# Patient Record
Sex: Female | Born: 1935
Health system: Southern US, Community
[De-identification: ages and names within clinical notes are randomized; demographics above are authoritative.]

## PROBLEM LIST (undated history)

## (undated) DIAGNOSIS — I350 Nonrheumatic aortic (valve) stenosis: Secondary | ICD-10-CM

## (undated) DIAGNOSIS — R7309 Other abnormal glucose: Secondary | ICD-10-CM

## (undated) DIAGNOSIS — I10 Essential (primary) hypertension: Secondary | ICD-10-CM

## (undated) DIAGNOSIS — Z9109 Other allergy status, other than to drugs and biological substances: Secondary | ICD-10-CM

## (undated) DIAGNOSIS — M810 Age-related osteoporosis without current pathological fracture: Secondary | ICD-10-CM

## (undated) DIAGNOSIS — F17200 Nicotine dependence, unspecified, uncomplicated: Secondary | ICD-10-CM

## (undated) DIAGNOSIS — E785 Hyperlipidemia, unspecified: Secondary | ICD-10-CM

## (undated) DIAGNOSIS — T7840XA Allergy, unspecified, initial encounter: Secondary | ICD-10-CM

## (undated) DIAGNOSIS — H269 Unspecified cataract: Secondary | ICD-10-CM

## (undated) HISTORY — DX: Nicotine dependence, unspecified, uncomplicated: F17.200

## (undated) HISTORY — DX: Age-related osteoporosis without current pathological fracture: M81.0

## (undated) HISTORY — DX: Hyperlipidemia, unspecified: E78.5

## (undated) HISTORY — DX: Allergy, unspecified, initial encounter: T78.40XA

## (undated) HISTORY — PX: POLYPECTOMY: SHX149

## (undated) HISTORY — DX: Essential (primary) hypertension: I10

## (undated) HISTORY — PX: ABDOMINAL HYSTERECTOMY: SHX81

## (undated) HISTORY — DX: Other allergy status, other than to drugs and biological substances: Z91.09

## (undated) HISTORY — DX: Other abnormal glucose: R73.09

## (undated) HISTORY — DX: Unspecified cataract: H26.9

## (undated) HISTORY — PX: COLONOSCOPY: SHX174

---

## 1978-03-20 HISTORY — PX: TOTAL ABDOMINAL HYSTERECTOMY W/ BILATERAL SALPINGOOPHORECTOMY: SHX83

## 2001-05-19 ENCOUNTER — Emergency Department (HOSPITAL_COMMUNITY): Admission: EM | Admit: 2001-05-19 | Discharge: 2001-05-19 | Payer: Self-pay | Admitting: Emergency Medicine

## 2004-12-22 ENCOUNTER — Ambulatory Visit: Payer: Self-pay | Admitting: Endocrinology

## 2004-12-29 ENCOUNTER — Ambulatory Visit: Payer: Self-pay | Admitting: Endocrinology

## 2006-02-20 ENCOUNTER — Ambulatory Visit: Payer: Self-pay | Admitting: Endocrinology

## 2006-02-20 LAB — CONVERTED CEMR LAB
AST: 22 units/L (ref 0–37)
Albumin: 3.9 g/dL (ref 3.5–5.2)
Alkaline Phosphatase: 78 units/L (ref 39–117)
Basophils Absolute: 0 10*3/uL (ref 0.0–0.1)
Bilirubin Urine: NEGATIVE
CO2: 23 meq/L (ref 19–32)
Chloride: 108 meq/L (ref 96–112)
GFR calc non Af Amer: 66 mL/min
Glomerular Filtration Rate, Af Am: 80 mL/min/{1.73_m2}
Glucose, Bld: 102 mg/dL — ABNORMAL HIGH (ref 70–99)
HCT: 39 % (ref 36.0–46.0)
HDL: 56.5 mg/dL (ref >39.0)
Ketones, ur: NEGATIVE mg/dL
Lymphocytes Relative: 33.7 % (ref 12.0–46.0)
MCV: 88.9 fL (ref 78.0–100.0)
Potassium: 4.2 meq/L (ref 3.5–5.1)
TSH: 2.28 microintl units/mL (ref 0.35–5.50)
Total Bilirubin: 0.6 mg/dL (ref 0.3–1.2)
Total Protein, Urine: NEGATIVE mg/dL
Urine Glucose: NEGATIVE mg/dL
Urobilinogen, UA: 0.2 (ref 0.0–1.0)
VLDL: 22 mg/dL (ref 0–40)
pH: 5.5 (ref 5.0–8.0)

## 2006-02-23 ENCOUNTER — Ambulatory Visit: Payer: Self-pay | Admitting: Endocrinology

## 2006-03-22 ENCOUNTER — Ambulatory Visit: Payer: Self-pay | Admitting: Gastroenterology

## 2006-03-22 ENCOUNTER — Ambulatory Visit: Payer: Self-pay | Admitting: Family Medicine

## 2006-04-02 ENCOUNTER — Encounter (INDEPENDENT_AMBULATORY_CARE_PROVIDER_SITE_OTHER): Payer: Self-pay | Admitting: *Deleted

## 2006-04-02 ENCOUNTER — Ambulatory Visit: Payer: Self-pay | Admitting: Gastroenterology

## 2006-12-01 ENCOUNTER — Encounter: Payer: Self-pay | Admitting: Endocrinology

## 2006-12-01 DIAGNOSIS — F17200 Nicotine dependence, unspecified, uncomplicated: Secondary | ICD-10-CM

## 2006-12-01 DIAGNOSIS — I10 Essential (primary) hypertension: Secondary | ICD-10-CM

## 2006-12-01 DIAGNOSIS — E785 Hyperlipidemia, unspecified: Secondary | ICD-10-CM

## 2006-12-01 DIAGNOSIS — R739 Hyperglycemia, unspecified: Secondary | ICD-10-CM | POA: Insufficient documentation

## 2006-12-01 DIAGNOSIS — R7309 Other abnormal glucose: Secondary | ICD-10-CM

## 2006-12-01 DIAGNOSIS — M810 Age-related osteoporosis without current pathological fracture: Secondary | ICD-10-CM | POA: Insufficient documentation

## 2006-12-01 HISTORY — DX: Essential (primary) hypertension: I10

## 2006-12-01 HISTORY — DX: Hyperlipidemia, unspecified: E78.5

## 2006-12-01 HISTORY — DX: Nicotine dependence, unspecified, uncomplicated: F17.200

## 2006-12-01 HISTORY — DX: Age-related osteoporosis without current pathological fracture: M81.0

## 2006-12-01 HISTORY — DX: Other abnormal glucose: R73.09

## 2007-03-20 ENCOUNTER — Encounter: Payer: Self-pay | Admitting: Endocrinology

## 2007-04-24 ENCOUNTER — Ambulatory Visit: Payer: Self-pay | Admitting: Endocrinology

## 2007-05-01 ENCOUNTER — Ambulatory Visit: Payer: Self-pay | Admitting: Internal Medicine

## 2007-05-01 ENCOUNTER — Encounter: Payer: Self-pay | Admitting: Endocrinology

## 2007-05-21 ENCOUNTER — Telehealth (INDEPENDENT_AMBULATORY_CARE_PROVIDER_SITE_OTHER): Payer: Self-pay | Admitting: *Deleted

## 2008-01-30 ENCOUNTER — Encounter: Payer: Self-pay | Admitting: Endocrinology

## 2008-05-08 ENCOUNTER — Ambulatory Visit: Payer: Self-pay | Admitting: Endocrinology

## 2008-05-11 ENCOUNTER — Telehealth (INDEPENDENT_AMBULATORY_CARE_PROVIDER_SITE_OTHER): Payer: Self-pay | Admitting: *Deleted

## 2008-05-25 ENCOUNTER — Telehealth (INDEPENDENT_AMBULATORY_CARE_PROVIDER_SITE_OTHER): Payer: Self-pay | Admitting: *Deleted

## 2008-06-11 ENCOUNTER — Ambulatory Visit: Payer: Self-pay | Admitting: Endocrinology

## 2008-07-30 ENCOUNTER — Encounter: Payer: Self-pay | Admitting: Endocrinology

## 2009-02-02 ENCOUNTER — Encounter: Payer: Self-pay | Admitting: Endocrinology

## 2009-02-05 ENCOUNTER — Encounter: Payer: Self-pay | Admitting: Endocrinology

## 2009-03-02 ENCOUNTER — Encounter (INDEPENDENT_AMBULATORY_CARE_PROVIDER_SITE_OTHER): Payer: Self-pay | Admitting: *Deleted

## 2009-03-23 ENCOUNTER — Encounter (INDEPENDENT_AMBULATORY_CARE_PROVIDER_SITE_OTHER): Payer: Self-pay | Admitting: *Deleted

## 2009-03-25 ENCOUNTER — Encounter (INDEPENDENT_AMBULATORY_CARE_PROVIDER_SITE_OTHER): Payer: Self-pay | Admitting: *Deleted

## 2009-03-26 ENCOUNTER — Ambulatory Visit: Payer: Self-pay | Admitting: Gastroenterology

## 2009-04-05 ENCOUNTER — Ambulatory Visit: Payer: Self-pay | Admitting: Gastroenterology

## 2009-04-05 LAB — HM COLONOSCOPY

## 2009-04-08 ENCOUNTER — Encounter: Payer: Self-pay | Admitting: Gastroenterology

## 2009-04-13 ENCOUNTER — Telehealth: Payer: Self-pay | Admitting: Endocrinology

## 2009-04-19 ENCOUNTER — Telehealth: Payer: Self-pay | Admitting: Endocrinology

## 2009-05-17 ENCOUNTER — Telehealth: Payer: Self-pay | Admitting: Endocrinology

## 2009-06-21 ENCOUNTER — Telehealth: Payer: Self-pay | Admitting: Endocrinology

## 2009-06-21 ENCOUNTER — Telehealth (INDEPENDENT_AMBULATORY_CARE_PROVIDER_SITE_OTHER): Payer: Self-pay | Admitting: *Deleted

## 2009-06-22 ENCOUNTER — Ambulatory Visit: Payer: Self-pay | Admitting: Endocrinology

## 2009-06-22 ENCOUNTER — Ambulatory Visit: Payer: Self-pay | Admitting: Internal Medicine

## 2009-06-22 DIAGNOSIS — M25559 Pain in unspecified hip: Secondary | ICD-10-CM

## 2009-06-22 LAB — CONVERTED CEMR LAB: Calcium, Total (PTH): 9.4 mg/dL (ref 8.4–10.5)

## 2009-06-28 ENCOUNTER — Telehealth: Payer: Self-pay | Admitting: Endocrinology

## 2009-07-19 ENCOUNTER — Encounter: Payer: Self-pay | Admitting: Endocrinology

## 2009-08-10 ENCOUNTER — Encounter: Payer: Self-pay | Admitting: Endocrinology

## 2009-08-18 ENCOUNTER — Encounter: Payer: Self-pay | Admitting: Endocrinology

## 2009-08-23 ENCOUNTER — Telehealth: Payer: Self-pay | Admitting: Endocrinology

## 2010-02-03 ENCOUNTER — Encounter: Payer: Self-pay | Admitting: Endocrinology

## 2010-02-07 ENCOUNTER — Encounter: Payer: Self-pay | Admitting: Endocrinology

## 2010-02-14 ENCOUNTER — Other Ambulatory Visit
Admission: RE | Admit: 2010-02-14 | Discharge: 2010-02-14 | Payer: Self-pay | Source: Home / Self Care | Admitting: Radiology

## 2010-02-14 ENCOUNTER — Encounter: Payer: Self-pay | Admitting: Endocrinology

## 2010-03-30 ENCOUNTER — Telehealth: Payer: Self-pay | Admitting: Endocrinology

## 2010-04-17 LAB — CONVERTED CEMR LAB
ALT: 24 units/L (ref 0–35)
ALT: 25 units/L (ref 0–35)
AST: 23 units/L (ref 0–37)
Albumin: 4.2 g/dL (ref 3.5–5.2)
BUN: 7 mg/dL (ref 6–23)
Bacteria, UA: NEGATIVE
Basophils Absolute: 0 10*3/uL (ref 0.0–0.1)
Basophils Relative: 0.6 % (ref 0.0–3.0)
Basophils Relative: 0.9 % (ref 0.0–1.0)
Bilirubin Urine: NEGATIVE
Bilirubin Urine: NEGATIVE
Bilirubin, Direct: 0.2 mg/dL (ref 0.0–0.3)
Bilirubin, Direct: 0.2 mg/dL (ref 0.0–0.3)
Calcium: 9.6 mg/dL (ref 8.4–10.5)
Calcium: 9.6 mg/dL (ref 8.4–10.5)
Chloride: 105 meq/L (ref 96–112)
Chloride: 106 meq/L (ref 96–112)
Creatinine, Ser: 1 mg/dL (ref 0.4–1.2)
Crystals: NEGATIVE
Eosinophils Absolute: 0.1 10*3/uL (ref 0.0–0.7)
Eosinophils Relative: 1.8 % (ref 0.0–5.0)
GFR calc Af Amer: 70 mL/min
GFR calc non Af Amer: 58 mL/min
GFR calc non Af Amer: 75 mL/min
GFR calc non Af Amer: 90.4 mL/min (ref >60)
Glucose, Bld: 106 mg/dL — ABNORMAL HIGH (ref 70–99)
Glucose, Bld: 108 mg/dL — ABNORMAL HIGH (ref 70–99)
HCT: 42.5 % (ref 36.0–46.0)
HCT: 42.5 % (ref 36.0–46.0)
HDL: 66.7 mg/dL (ref >39.00)
HDL: 67.8 mg/dL (ref >39.0)
Hgb A1c MFr Bld: 6.1 % (ref 4.6–6.5)
Hgb A1c MFr Bld: 6.1 % — ABNORMAL HIGH (ref 4.6–6.0)
Hgb A1c MFr Bld: 6.2 % — ABNORMAL HIGH (ref 4.6–6.0)
Ketones, ur: NEGATIVE mg/dL
LDL Cholesterol: 58 mg/dL (ref 0–99)
LDL Cholesterol: 62 mg/dL (ref 0–99)
LDL Cholesterol: 68 mg/dL (ref 0–99)
Lymphocytes Relative: 29.6 % (ref 12.0–46.0)
MCV: 89.6 fL (ref 78.0–100.0)
MCV: 92.4 fL (ref 78.0–100.0)
Monocytes Absolute: 0.5 10*3/uL (ref 0.1–1.0)
Monocytes Absolute: 0.5 10*3/uL (ref 0.2–0.7)
Monocytes Relative: 6.4 % (ref 3.0–12.0)
Monocytes Relative: 8.6 % (ref 3.0–11.0)
Mucus, UA: NEGATIVE
Neutrophils Relative %: 67.9 % (ref 43.0–77.0)
Platelets: 332 10*3/uL (ref 150–400)
Potassium: 3.7 meq/L (ref 3.5–5.1)
Potassium: 4.1 meq/L (ref 3.5–5.1)
RBC: 4.6 M/uL (ref 3.87–5.11)
RBC: 4.83 M/uL (ref 3.87–5.11)
Sodium: 142 meq/L (ref 135–145)
Sodium: 143 meq/L (ref 135–145)
Specific Gravity, Urine: 1.015 (ref 1.000–1.030)
TSH: 1.67 microintl units/mL (ref 0.35–5.50)
TSH: 1.76 microintl units/mL (ref 0.35–5.50)
Total Bilirubin: 0.8 mg/dL (ref 0.3–1.2)
Total Bilirubin: 0.9 mg/dL (ref 0.3–1.2)
Total CHOL/HDL Ratio: 2
Triglycerides: 72 mg/dL (ref 0–149)
Triglycerides: 85 mg/dL (ref 0.0–149.0)
Urine Glucose: NEGATIVE mg/dL
Urobilinogen, UA: 0.2 (ref 0.0–1.0)
VLDL: 14 mg/dL (ref 0–40)
VLDL: 17 mg/dL (ref 0.0–40.0)
WBC: 5.4 10*3/uL (ref 4.5–10.5)
WBC: 5.4 10*3/uL (ref 4.5–10.5)

## 2010-04-19 NOTE — Progress Notes (Signed)
  Phone Note Refill Request Message from:  Fax from Pharmacy on June 21, 2009 9:10 AM  Refills Requested: Medication #1:  ZETIA 10 MG  TABS take 1 by mouth qd   Dosage confirmed as above?Dosage Confirmed Initial call taken by: Josph Macho RMA,  June 21, 2009 9:10 AM    Prescriptions: ZETIA 10 MG  TABS (EZETIMIBE) take 1 by mouth qd  #30 x 1   Entered by:   Josph Macho RMA   Authorized by:   Minus Breeding MD   Signed by:   Josph Macho RMA on 06/21/2009   Method used:   Electronically to        [facility] (retail)       [address]       [place], [place]  [address]       Ph: [phone]       Fax: [phone]   RxID:   [id number]

## 2010-04-19 NOTE — Assessment & Plan Note (Signed)
Summary: PER CHRISTY  --STC   Vital Signs:  Patient profile:   76 year old female Height:      60 inches (152.40 cm) Weight:      161.25 pounds (73.30 kg) BMI:     31.61 O2 Sat:      98 % on Room air Temp:     97.4 degrees F (36.33 degrees C) oral Pulse rate:   88 / minute BP sitting:   120 / 82  (left arm) Cuff size:   regular  Vitals Entered By: Josph Macho RMA (June 22, 2009 9:24 AM)  O2 Flow:  Room air CC: Physical/ CF   CC:  Physical/ CF.  History of Present Illness: here for regular wellness examination.  she's feeling pretty well in general, and does not drink etoh.   Current Medications (verified): 1)  Zetia 10 Mg  Tabs (Ezetimibe) .... Take 1 By Mouth Qd 2)  Lipitor 80 Mg  Tabs (Atorvastatin Calcium) .... Take 1 By Mouth Once Daily Pls Keep 04/24/07 Appt For Refills 3)  Fosamax 70 Mg  Tabs (Alendronate Sodium) .... Q Week 4)  Triamterene-Hctz 37.5-25 Mg Caps (Triamterene-Hctz) .... 1/2 Tab Qd  Allergies (verified): 1)  ! Zocor 2)  ! Benicar (Olmesartan Medoxomil) 3)  ! Cozaar (Losartan Potassium)  Family History: Reviewed history from 05/08/2008 and no changes required. mother had "throat" and colon cancer, both at an advanced age.  Social History: Reviewed history from 04/24/2007 and no changes required. married retired  Review of Systems  The patient denies fever, weight loss, weight gain, vision loss, decreased hearing, chest pain, syncope, dyspnea on exertion, prolonged cough, headaches, abdominal pain, melena, hematochezia, severe indigestion/heartburn, hematuria, and depression.    Physical Exam  General:  normal appearance.   Head:  head: no deformity eyes: no periorbital swelling, no proptosis external nose and ears are normal mouth: no lesion seen Neck:  Supple without thyroid enlargement or tenderness.  Breasts:  No tenderness, masses, nipple discharge, or skin abnormalities.  Lungs:  Clear to auscultation bilaterally. Normal  respiratory effort.  Heart:  Regular rate and rhythm without murmurs or gallops noted. Normal S1,S2.   Abdomen:  abdomen is soft, nontender.  no hepatosplenomegaly.   not distended.  no hernia  Rectal:  normal external and internal exam.  heme neg  Genitalia:  (had tah/bso) Msk:  muscle bulk and strength are grossly normal.  no obvious joint swelling.    Pulses:  dorsalis pedis intact bilat.  no carotid bruit  Extremities:  no deformity.  no ulcer on the feet.  feet are of normal color and temp.  no edema  Neurologic:  no deformity.  no ulcer on the feet.  feet are of normal color and temp.  no edema sensation is intact to touch on the feet  Skin:  normal texture and temp.  no rash.  not diaphoretic  Cervical Nodes:  No significant adenopathy.  Psych:  Alert and cooperative; normal mood and affect; normal attention span and concentration.   Additional Exam:  SEPARATE EVALUATION FOLLOWS--EACH PROBLEM HERE IS NEW, NOT RESPONDING TO TREATMENT, OR POSES SIGNIFICANT RISK TO THE PATIENT'S HEALTH: HISTORY OF THE PRESENT ILLNESS: pt states few mos intermittent pain at the right hip and lateral thigh.  no associated numbness.  no injury PAST MEDICAL HISTORY reviewed and up to date today REVIEW OF SYSTEMS: denies rash PHYSICAL EXAMINATION: right hip is nontender. gait is normal and painless LAB/XRAY RESULTS:  RIGHT HIP X-RAY: Marginal osteophyte  formation is seen consistent with degenerative spurring.  No fracture or acute process is seen.  Osteopenic appearance of bones. IMPRESSION: hip pain, prob due to oa PLAN: see instructions    Impression & Recommendations:  Problem # 1:  ROUTINE GENERAL MEDICAL EXAM@HEALTH  CARE FACL (ICD-V70.0)  Other Orders: EKG w/ Interpretation (93000) T-Parathyroid Hormone, Intact w/ Calcium ([address]) T-Bone Densitometry (09811) T-Hip Comp Right Min 2 views (73510TC) TLB-Lipid Panel (80061-LIPID) TLB-BMP (Basic Metabolic Panel-BMET)  (80048-METABOL) TLB-CBC Platelet - w/Differential (85025-CBCD) TLB-Hepatic/Liver Function Pnl (80076-HEPATIC) TLB-TSH (Thyroid Stimulating Hormone) (84443-TSH) TLB-A1C / Hgb A1C (Glycohemoglobin) (83036-A1C) TLB-Udip w/ Micro (81001-URINE) Est. Patient Level III (91478) Est. Patient 65& > (29562)   Patient Instructions: 1)  blood tests and x-ray today. 2)  tests are being ordered for you today.  a few days after the test(s), please call [phone] to hear your test results. 3)  pending the test results, please continue the same medications for now. 4)  Please schedule a follow-up appointment in 1 year. 5)  please consider these measures for your health:  minimize alcohol.  do not use tobacco products.  have a colonoscopy at least every 10 years from age 36.  keep firearms safely stored.  always use seat belts.  have working smoke alarms in your home.  see the dentist regularly.  never drive under the influence of alcohol or drugs (including prescription drugs).  those with fair skin should take precautions against the sun. 6)  please let me know what your wishes would be, if artificial life support measures should become necessary.  it is critically important to prevent falling down (keep floor areas well-lit, dry, and free of loose objects) 7)  we discussed code status.  pt requests full code, but would not want to be started or maintained on artificial life-support measures if there was not a reasonable chance of recovery

## 2010-04-19 NOTE — Progress Notes (Signed)
  Phone Note Refill Request Message from:  Fax from Pharmacy on June 28, 2009 8:11 AM  Refills Requested: Medication #1:  TRIAMTERENE-HCTZ 37.5-25 MG CAPS 1/2 tab qd.   Dosage confirmed as above?Dosage Confirmed Initial call taken by: Josph Macho RMA,  June 28, 2009 8:11 AM    Prescriptions: TRIAMTERENE-HCTZ 37.5-25 MG CAPS (TRIAMTERENE-HCTZ) 1/2 tab qd  #30 x 3   Entered by:   Josph Macho RMA   Authorized by:   Minus Breeding MD   Signed by:   Josph Macho RMA on 06/28/2009   Method used:   Electronically to        [facility] (retail)       [address]       [place], [place]  [address]       Ph: [phone]       Fax: [phone]   RxID:   [id number]

## 2010-04-19 NOTE — Op Note (Signed)
Summary: MG & US Guided Cyst Aspiration L-Breast / [facility]  MG & US Guided Cyst Aspiration L-Breast / [facility]   Imported By: Lennie Odor 02/17/2010 11:29:25  _____________________________________________________________________  External Attachment:    Type:   Image     Comment:   External Document

## 2010-04-19 NOTE — Progress Notes (Signed)
  Phone Note Refill Request Message from:  Fax from Pharmacy on June 21, 2009 9:11 AM  Refills Requested: Medication #1:  LIPITOR 80 MG  TABS TAKE 1 by mouth once daily PLS KEEP 04/24/07 APPT FOR REFILLS   Dosage confirmed as above?Dosage Confirmed Initial call taken by: Josph Macho RMA,  June 21, 2009 9:11 AM    Prescriptions: LIPITOR 80 MG  TABS (ATORVASTATIN CALCIUM) TAKE 1 by mouth once daily PLS KEEP 04/24/07 APPT FOR REFILLS  #30 x 1   Entered by:   Josph Macho RMA   Authorized by:   Minus Breeding MD   Signed by:   Josph Macho RMA on 06/21/2009   Method used:   Electronically to        [facility] [place] (retail)       [address]       [place], [place]  [address]       Ph: [phone]       Fax: [phone]   RxID:   [id number]   Appended Document:  Accidently signed before clicking MD's name.

## 2010-04-19 NOTE — Progress Notes (Signed)
  Phone Note Refill Request Message from:  Fax from Pharmacy on May 17, 2009 9:10 AM  Refills Requested: Medication #1:  LIPITOR 80 MG  TABS TAKE 1 by mouth once daily PLS KEEP 04/24/07 APPT FOR REFILLS   Dosage confirmed as above?Dosage Confirmed Initial call taken by: Josph Macho RMA,  May 17, 2009 9:10 AM    Prescriptions: LIPITOR 80 MG  TABS (ATORVASTATIN CALCIUM) TAKE 1 by mouth once daily PLS KEEP 04/24/07 APPT FOR REFILLS  #30 x 1   Entered by:   Josph Macho RMA   Authorized by:   Minus Breeding MD   Signed by:   Josph Macho RMA on 05/17/2009   Method used:   Electronically to        [facility] (retail)       [address]       [place], [place]  [address]       Ph: [phone]       Fax: [phone]   RxID:   [id number]

## 2010-04-19 NOTE — Miscellaneous (Signed)
Summary: BONE DENSITY  Clinical Lists Changes  Orders: Added new Test order of T-Lumbar Vertebral Assessment (77082) - Signed 

## 2010-04-19 NOTE — Progress Notes (Signed)
  Phone Note Refill Request Message from:  Fax from Pharmacy on April 19, 2009 8:49 AM  Refills Requested: Medication #1:  ZETIA 10 MG  TABS take 1 by mouth qd   Dosage confirmed as above?Dosage Confirmed Initial call taken by: Josph Macho CMA,  April 19, 2009 8:49 AM    Prescriptions: ZETIA 10 MG  TABS (EZETIMIBE) take 1 by mouth qd  #30 x 1   Entered by:   Josph Macho CMA   Authorized by:   Minus Breeding MD   Signed by:   Josph Macho CMA on 04/19/2009   Method used:   Electronically to        [facility] (retail)       [address]       [place], [place]  [address]       Ph: [phone]       Fax: [phone]   RxID:   [id number]

## 2010-04-19 NOTE — Letter (Signed)
Summary: [facility] Instructions  [facility]  [address] [place], [place] [address]   Phone: [phone]  Fax: [phone]       Alexandria Randolph    January 02, 1935    MRN: [id number]        Procedure Day Dorna Bloom:  Duanne Limerick  04/05/09     Arrival Time:  1:00PM     Procedure Time:  2:00PM     Location of Procedure:                    _ X_  [facility] (4th Floor)                        PREPARATION FOR COLONOSCOPY WITH MOVIPREP   Starting 5 days prior to your procedure 03/31/09 do not eat nuts, seeds, popcorn, corn, beans, peas,  salads, or any raw vegetables.  Do not take any fiber supplements (e.g. Metamucil, Citrucel, and Benefiber).  THE DAY BEFORE YOUR PROCEDURE         DATE: 04/04/09  DAY: SUNDAY  1.  Drink clear liquids the entire day-NO SOLID FOOD  2.  Do not drink anything colored red or purple.  Avoid juices with pulp.  No orange juice.  3.  Drink at least 64 oz. (8 glasses) of fluid/clear liquids during the day to prevent dehydration and help the prep work efficiently.  CLEAR LIQUIDS INCLUDE: Water Jello Ice Popsicles Tea (sugar ok, no milk/cream) Powdered fruit flavored drinks Coffee (sugar ok, no milk/cream) Gatorade Juice: apple, white grape, white cranberry  Lemonade Clear bullion, consomm, broth Carbonated beverages (any kind) Strained chicken noodle soup Hard Candy                             4.  In the morning, mix first dose of MoviPrep solution:    Empty 1 Pouch A and 1 Pouch B into the disposable container    Add lukewarm drinking water to the top line of the container. Mix to dissolve    Refrigerate (mixed solution should be used within 24 hrs)  5.  Begin drinking the prep at 5:00 p.m. The MoviPrep container is divided by 4 marks.   Every 15 minutes drink the solution down to the next mark (approximately 8 oz) until the full liter is complete.   6.  Follow completed prep with 16 oz of clear liquid of your choice  (Nothing red or purple).  Continue to drink clear liquids until bedtime.  7.  Before going to bed, mix second dose of MoviPrep solution:    Empty 1 Pouch A and 1 Pouch B into the disposable container    Add lukewarm drinking water to the top line of the container. Mix to dissolve    Refrigerate  THE DAY OF YOUR PROCEDURE      DATE: 04/05/09 DAY: MONDAY  Beginning at 9:00AM (5 hours before procedure):         1. Every 15 minutes, drink the solution down to the next mark (approx 8 oz) until the full liter is complete.  2. Follow completed prep with 16 oz. of clear liquid of your choice.    3. You may drink clear liquids until 12:00PM (2 HOURS BEFORE PROCEDURE).   MEDICATION INSTRUCTIONS  Unless otherwise instructed, you should take regular prescription medications with a small sip of water   as early as possible the morning of  your procedure.   Additional medication instructions: Hold Triamterene/HCTZ the morning of procedure.         OTHER INSTRUCTIONS  You will need a responsible adult at least 74 years of age to accompany you and drive you home.   This person must remain in the waiting room during your procedure.  Wear loose fitting clothing that is easily removed.  Leave jewelry and other valuables at home.  However, you may wish to bring a book to read or  an iPod/MP3 player to listen to music as you wait for your procedure to start.  Remove all body piercing jewelry and leave at home.  Total time from sign-in until discharge is approximately 2-3 hours.  You should go home directly after your procedure and rest.  You can resume normal activities the  day after your procedure.  The day of your procedure you should not:   Drive   Make legal decisions   Operate machinery   Drink alcohol   Return to work  You will receive specific instructions about eating, activities and medications before you leave.    The above instructions have been reviewed and  explained to me by   Wyona Almas RN  March 26, 2009 9:25 AM     I fully understand and can verbalize these instructions _____________________________ Date _________

## 2010-04-19 NOTE — Progress Notes (Signed)
  Phone Note Refill Request Message from:  Fax from Pharmacy on June 21, 2009 9:12 AM  Refills Requested: Medication #1:  LIPITOR 80 MG  TABS TAKE 1 by mouth once daily PLS KEEP 04/24/07 APPT FOR REFILLS   Dosage confirmed as above?Dosage Confirmed Initial call taken by: Josph Macho RMA,  June 21, 2009 9:12 AM    Prescriptions: LIPITOR 80 MG  TABS (ATORVASTATIN CALCIUM) TAKE 1 by mouth once daily PLS KEEP 04/24/07 APPT FOR REFILLS  #30 x 1   Entered by:   Josph Macho RMA   Authorized by:   Minus Breeding MD   Signed by:   Josph Macho RMA on 06/21/2009   Method used:   Electronically to        [facility] (retail)       [address]       [place], [place]  [address]       Ph: [phone]       Fax: [phone]   RxID:   [id number]

## 2010-04-19 NOTE — Letter (Signed)
Summary: Previsit letter  [facility]  [address] [place], [place] [address]   Phone: [phone]  Fax: [phone]       03/23/2009 MRN: [id number]  [facility] [address] [place], [place]  [address]  Dear Ms. Eckroth,  Welcome to the Gastroenterology Division at [facility].    You are scheduled to see a nurse for your pre-procedure visit on March 26, 2009 at 9:00am on the 3rd floor at [facility], [address]  We ask that you try to arrive at our office 15 minutes prior to your appointment time to allow for check-in.  Your nurse visit will consist of discussing your medical and surgical history, your immediate family medical history, and your medications.    Please bring a complete list of all your medications or, if you prefer, bring the medication bottles and we will list them.  We will need to be aware of both prescribed and over the counter drugs.  We will need to know exact dosage information as well.  If you are on blood thinners (Coumadin, Plavix, Aggrenox, Ticlid, etc.) please call our office today/prior to your appointment, as we need to consult with your physician about holding your medication.   Please be prepared to read and sign documents such as consent forms, a financial agreement, and acknowledgement forms.  If necessary, and with your consent, a friend or relative is welcome to sit-in on the nurse visit with you.  Please bring your insurance card so that we may make a copy of it.  If your insurance requires a referral to see a specialist, please bring your referral form from your primary care physician.  No co-pay is required for this nurse visit.     If you cannot keep your appointment, please call [phone] to cancel or reschedule prior to your appointment date.  This allows [place] the opportunity to schedule an appointment for another patient in need of care.    Thank you for choosing [facility] for your medical  needs.  We appreciate the opportunity to care for you.  Please visit [place] at our website  to learn more about our practice.                     Sincerely.                                                                                                                   The Gastroenterology Division

## 2010-04-19 NOTE — Letter (Signed)
Summary: Patient Notice- Polyp Results  [facility]  [address] [place], [place] [address]   Phone: [phone]  Fax: [phone]        April 08, 2009 MRN: [id number]    [facility] [address] [place], [place]  [address]    Dear Ms. Matus,  I am pleased to inform you that the colon polyp(s) removed during your recent colonoscopy was (were) found to be benign (no cancer detected) upon pathologic examination.  I recommend you have a repeat colonoscopy examination in 5_ years to look for recurrent polyps, as having colon polyps increases your risk for having recurrent polyps or even colon cancer in the future.  Should you develop new or worsening symptoms of abdominal pain, bowel habit changes or bleeding from the rectum or bowels, please schedule an evaluation with either your primary care physician or with me.  Additional information/recommendations:  __ No further action with gastroenterology is needed at this time. Please      follow-up with your primary care physician for your other healthcare      needs.  __ Please call [phone] to schedule a return visit to review your      situation.  __ Please keep your follow-up visit as already scheduled.  _xx_ Continue treatment plan as outlined the day of your exam.  Please call us if you are having persistent problems or have questions about your condition that have not been fully answered at this time.  Sincerely,  Mardella Layman MD [facility]  This letter has been electronically signed by your physician.  Appended Document: Patient Notice- Polyp Results Letter mailed 1.21.11

## 2010-04-19 NOTE — Progress Notes (Signed)
  Phone Note Refill Request Message from:  Fax from Pharmacy on April 13, 2009 11:47 AM  Refills Requested: Medication #1:  TRIAMTERENE-HCTZ 37.5-25 MG CAPS 1/2 tab qd   Dosage confirmed as above?Dosage Confirmed Initial call taken by: Josph Macho CMA,  April 13, 2009 11:47 AM    Prescriptions: TRIAMTERENE-HCTZ 37.5-25 MG CAPS (TRIAMTERENE-HCTZ) 1/2 tab qd  #30 x 1   Entered by:   Josph Macho CMA   Authorized by:   Minus Breeding MD   Signed by:   Josph Macho CMA on 04/13/2009   Method used:   Electronically to        [facility] (retail)       [address]       [place], [place]  [address]       Ph: [phone]       Fax: [phone]   RxID:   [id number]

## 2010-04-19 NOTE — Miscellaneous (Signed)
Summary: [facility] PREVISIT/PREP  Clinical Lists Changes  Medications: Added new medication of MOVIPREP 100 GM  SOLR (PEG-KCL-NACL-NASULF-NA ASC-C) As per prep instructions. - Signed Rx of MOVIPREP 100 GM  SOLR (PEG-KCL-NACL-NASULF-NA ASC-C) As per prep instructions.;  #1 x 0;  Signed;  Entered by: Wyona Almas RN;  Authorized by: Mardella Layman MD [facility];  Method used: Electronically to [facility] [address] [place], [place], [place]  [address], Ph: [phone], Fax: [phone] Observations: Added new observation of ALLERGY REV: Done (03/26/2009 8:50)    Prescriptions: MOVIPREP 100 GM  SOLR (PEG-KCL-NACL-NASULF-NA ASC-C) As per prep instructions.  #1 x 0   Entered by:   Wyona Almas RN   Authorized by:   Mardella Layman MD [facility]   Signed by:   Wyona Almas RN on 03/26/2009   Method used:   Electronically to        [facility] [place] (retail)       [address]       [place], [place]  [address]       Ph: [phone]       Fax: [phone]   RxID:   [id number]

## 2010-04-19 NOTE — Procedures (Signed)
Summary: Colonoscopy  Patient: Alexandria Randolph Note: All result statuses are Final unless otherwise noted.  Tests: (1) Colonoscopy (COL)   COL Colonoscopy           DONE     [facility]     [address]     [place], [place]  [address]           COLONOSCOPY PROCEDURE REPORT           PATIENT:  [name], [name]  MR#:  [id number]     BIRTHDATE:  12/04/1934, 74 yrs. old  GENDER:  female           ENDOSCOPIST:  Vania Rea. Jarold Motto, MD, [facility]     Referred by:           PROCEDURE DATE:  04/05/2009     PROCEDURE:  Colonoscopy, Diagnostic     ASA CLASS:  Class II     INDICATIONS:  history of pre-cancerous (adenomatous) colon polyps                 MEDICATIONS:   Fentanyl 75 mcg IV, Versed 8 mg IV           DESCRIPTION OF PROCEDURE:   After the risks benefits and     alternatives of the procedure were thoroughly explained, informed     consent was obtained.  Digital rectal exam was performed and     revealed no abnormalities.   The LB [id number] endoscope     was introduced through the anus and advanced to the cecum, which     was identified by both the appendix and ileocecal valve, limited     by a redundant colon.    The quality of the prep was adequate,     using MoviPrep.  The instrument was then slowly withdrawn as the     colon was fully examined.     <<PROCEDUREIMAGES>>           FINDINGS:  A sessile polyp was found in the descending colon. 7mm     flat hemorrhagic polyp Polyp was snared, then cauterized with     monopolar cautery. Retrieval was successful. snare polyp  This was     otherwise a normal examination of the colon. very redundant colon     noted.   Retroflexed views in the rectum revealed hypertrophied     anal papillae.    The scope was then withdrawn from the patient     and the procedure completed.           COMPLICATIONS:  None           ENDOSCOPIC IMPRESSION:     1) Sessile polyp in the descending colon     2) Otherwise normal  examination     3) Hypertrophied anal papillae     RECOMMENDATIONS:     1) Follow up colonoscopy in 5 years           REPEAT EXAM:  No           ______________________________     Vania Rea. Jarold Motto, MD, [facility]           CC:           n.     eSIGNED:   Vania Rea. [place] at 04/05/2009 02:44 PM           Elmon Else, [id number]  Note: An exclamation mark (!) indicates a result that was not dispersed into the  flowsheet. Document Creation Date: 04/05/2009 2:43 PM _______________________________________________________________________  (1) Order result status: Final Collection or observation date-time: 04/05/2009 14:36 Requested date-time:  Receipt date-time:  Reported date-time:  Referring Physician:   Ordering Physician: Sheryn Bison [id number]) Specimen Source:  Source: Launa Grill Order Number: [id number] Lab site:   Appended Document: Colonoscopy     Procedures Next Due Date:    Colonoscopy: 04/2014

## 2010-04-19 NOTE — Progress Notes (Signed)
Summary: zetia & lipitor  Phone Note Refill Request Message from:  Fax from Pharmacy on August 23, 2009 9:38 AM  Refills Requested: Medication #1:  ZETIA 10 MG  TABS take 1 by mouth qd  Medication #2:  LIPITOR 80 MG  TABS TAKE 1 by mouth once daily PLS KEEP 04/24/07 APPT FOR REFILLS  Method Requested: Electronic Initial call taken by: Orlan Leavens,  August 23, 2009 9:38 AM    Prescriptions: LIPITOR 80 MG  TABS (ATORVASTATIN CALCIUM) TAKE 1 by mouth once daily PLS KEEP 04/24/07 APPT FOR REFILLS  #90 x 3   Entered by:   Orlan Leavens   Authorized by:   Minus Breeding MD   Signed by:   Orlan Leavens on 08/23/2009   Method used:   Electronically to        [facility] (retail)       [address]       [place], [place]  [address]       Ph: [phone]       Fax: [phone]   RxID:   [id number] ZETIA 10 MG  TABS (EZETIMIBE) take 1 by mouth qd  #90 x 3   Entered by:   Orlan Leavens   Authorized by:   Minus Breeding MD   Signed by:   Orlan Leavens on 08/23/2009   Method used:   Electronically to        [facility] [place] (retail)       [address]       [place], [place]  [address]       Ph: [phone]       Fax: [phone]   RxID:   [id number]

## 2010-04-21 NOTE — Progress Notes (Signed)
Summary: ALT med  Phone Note Call from Patient Call back at [facility] Phone [phone]   Caller: Patient Summary of Call: Pt called stating she has been experiencing severe muscle pains. Pt stopped taking Zetia and Lipitor and she said pains resolved. Pt does not want to re-start either medicine and is requesting MD advisement on alternate meds or decreased dosage. Pt is aware that she is due for OV in April 2012 Initial call taken by: Margaret Pyle, CMA,  March 30, 2010 9:58 AM  Follow-up for Phone Call        change zetia and lipitor to crestor 20 mg once daily.  i sent rx. Follow-up by: Minus Breeding MD,  March 30, 2010 10:23 AM  Additional Follow-up for Phone Call Additional follow up Details #1::        Pt advised Additional Follow-up by: Margaret Pyle, CMA,  March 30, 2010 11:07 AM    New/Updated Medications: CRESTOR 20 MG TABS (ROSUVASTATIN CALCIUM) 1 tab once daily Prescriptions: CRESTOR 20 MG TABS (ROSUVASTATIN CALCIUM) 1 tab once daily  #30 x 4   Entered and Authorized by:   Minus Breeding MD   Signed by:   Minus Breeding MD on 03/30/2010   Method used:   Electronically to        [facility] [place] (retail)       [address]       [place], [place]  [address]       Ph: [phone]       Fax: [phone]   RxID:   [id number]

## 2010-05-31 ENCOUNTER — Telehealth: Payer: Self-pay | Admitting: Endocrinology

## 2010-06-07 NOTE — Progress Notes (Signed)
Summary: Crestor SE  Phone Note Call from Patient Call back at Home Phone [phone]   Caller: Patient Summary of Call: Pt called stating that she is having muscle pains and weakness of LE, joint pain and indigestion since starting Crestor. Pt is requesting a lower dosage or alternate medication. Initial call taken by: Margaret Pyle, CMA,  May 31, 2010 2:27 PM  Follow-up for Phone Call        reduce to 10 mg once daily.  i sent rx.  yo can use-up current rx by cutting 20 mg in 1/2. Follow-up by: Minus Breeding MD,  May 31, 2010 3:30 PM  Additional Follow-up for Phone Call Additional follow up Details #1::        Pt informed of Rx reduction Additional Follow-up by: Margaret Pyle, CMA,  May 31, 2010 3:57 PM    New/Updated Medications: CRESTOR 10 MG TABS (ROSUVASTATIN CALCIUM) 1 tab once daily Prescriptions: CRESTOR 10 MG TABS (ROSUVASTATIN CALCIUM) 1 tab once daily  #30 x 11   Entered and Authorized by:   Minus Breeding MD   Signed by:   Minus Breeding MD on 05/31/2010   Method used:   Electronically to        [facility] [place] (retail)       [address]       [place], [place]  [address]       Ph: [phone]       Fax: [phone]   RxID:   [id number]

## 2010-06-20 ENCOUNTER — Other Ambulatory Visit: Payer: Self-pay | Admitting: Endocrinology

## 2010-06-20 MED ORDER — TRIAMTERENE-HCTZ 37.5-25 MG PO CAPS: ORAL_CAPSULE | ORAL | Status: DC

## 2010-06-20 NOTE — Telephone Encounter (Signed)
R'cd fax from [facility] for refill of pt's Triamterene-HCTZ med.  Last OV-06/22/2009   Last filled-06/18/2010

## 2010-09-14 ENCOUNTER — Ambulatory Visit (INDEPENDENT_AMBULATORY_CARE_PROVIDER_SITE_OTHER): Payer: Medicare Other | Admitting: Endocrinology

## 2010-09-14 ENCOUNTER — Encounter: Payer: Self-pay | Admitting: Endocrinology

## 2010-09-14 ENCOUNTER — Other Ambulatory Visit (INDEPENDENT_AMBULATORY_CARE_PROVIDER_SITE_OTHER): Payer: Medicare Other

## 2010-09-14 VITALS — BP 136/84 | HR 82 | Temp 98.2°F | Ht 60.0 in | Wt 151.8 lb

## 2010-09-14 DIAGNOSIS — I10 Essential (primary) hypertension: Secondary | ICD-10-CM

## 2010-09-14 DIAGNOSIS — R7309 Other abnormal glucose: Secondary | ICD-10-CM

## 2010-09-14 DIAGNOSIS — Z79899 Other long term (current) drug therapy: Secondary | ICD-10-CM

## 2010-09-14 DIAGNOSIS — J309 Allergic rhinitis, unspecified: Secondary | ICD-10-CM

## 2010-09-14 DIAGNOSIS — E785 Hyperlipidemia, unspecified: Secondary | ICD-10-CM

## 2010-09-14 DIAGNOSIS — J310 Chronic rhinitis: Secondary | ICD-10-CM | POA: Insufficient documentation

## 2010-09-14 DIAGNOSIS — Z0000 Encounter for general adult medical examination without abnormal findings: Secondary | ICD-10-CM

## 2010-09-14 LAB — BASIC METABOLIC PANEL
BUN: 14 mg/dL (ref 6–23)
CO2: 24 mEq/L (ref 19–32)
Chloride: 106 mEq/L (ref 96–112)
Glucose, Bld: 114 mg/dL — ABNORMAL HIGH (ref 70–99)
Potassium: 5 mEq/L (ref 3.5–5.1)

## 2010-09-14 LAB — CBC WITH DIFFERENTIAL/PLATELET
Basophils Relative: 1.2 % (ref 0.0–3.0)
Eosinophils Relative: 1.1 % (ref 0.0–5.0)
Lymphocytes Relative: 24.9 % (ref 12.0–46.0)
MCV: 90.9 fl (ref 78.0–100.0)
Neutrophils Relative %: 64.3 % (ref 43.0–77.0)
RBC: 4.77 Mil/uL (ref 3.87–5.11)
WBC: 6.1 10*3/uL (ref 4.5–10.5)

## 2010-09-14 LAB — URINALYSIS, ROUTINE W REFLEX MICROSCOPIC
Bilirubin Urine: NEGATIVE
Ketones, ur: NEGATIVE
pH: 6 (ref 5.0–8.0)

## 2010-09-14 LAB — LIPID PANEL
LDL Cholesterol: 81 mg/dL (ref 0–99)
Total CHOL/HDL Ratio: 2
VLDL: 22 mg/dL (ref 0.0–40.0)

## 2010-09-14 LAB — HEPATIC FUNCTION PANEL
ALT: 22 U/L (ref 0–35)
AST: 26 U/L (ref 0–37)
Alkaline Phosphatase: 103 U/L (ref 39–117)
Bilirubin, Direct: 0.1 mg/dL (ref 0.0–0.3)
Total Protein: 8.2 g/dL (ref 6.0–8.3)

## 2010-09-14 MED ORDER — TRIAMTERENE-HCTZ 37.5-25 MG PO CAPS: ORAL_CAPSULE | ORAL | Status: DC

## 2010-09-14 NOTE — Patient Instructions (Addendum)
blood tests are being ordered for you today.  please call [phone] to hear your test results.  You will be prompted to enter the 9-digit "MRN" number that appears at the top left of this page, followed by #.  Then you will hear the message. please consider these measures for your health:  minimize alcohol.  do not use tobacco products.  have a colonoscopy at least every 10 years from age 76.  keep firearms safely stored.  always use seat belts.  have working smoke alarms in your home.  see an eye doctor and dentist regularly.  never drive under the influence of alcohol or drugs (including prescription drugs).   please let me know what your wishes would be, if artificial life support measures should become necessary.  it is critically important to prevent falling down (keep floor areas well-lit, dry, and free of loose objects). good diet and exercise habits significanly improve your health.  please let me know if you wish to be referred to a dietician.  you should see an eye doctor every year. controlling your blood pressure and cholesterol drastically reduces the damage to your body.  you should take an aspirin every day, unless you have been advised by a doctor not to. Please return in 1 year. Here are some samples of "nasonex" nasal spray.  Take 1 spray each side daily.  Call if this helps, and i'll prescribe you a similar generic). (we discussed code status.  pt requests full code, but would not want to be started or maintained on artificial life-support measures if there was not a reasonable chance of recovery)

## 2010-09-14 NOTE — Progress Notes (Signed)
Subjective:    Patient ID: Alexandria Randolph, female    DOB: 05/31/1934, 76 y.o.   MRN: [id number]  HPI Subjective:   Patient here for Medicare annual wellness visit and management of other chronic and acute problems.     Risk factors: advanced age.   Roster of Physicians Providing Medical Care to Patient: Opthal:  cashwell   Activities of Daily Living: In your present state of health, do you have any difficulty performing the following activities?:  Preparing food and eating?: No  Bathing yourself: No  Getting dressed: No  Using the toilet:No  Moving around from place to place: No  In the past year have you fallen or had a near fall?: No    Home Safety: Has smoke detector and wears seat belts. No firearms.  Diet and Exercise  Current exercise habits:  "not good." Dietary issues discussed: pt reports a healthy diet   Depression Screen  Q1: Over the past two weeks, have you felt down, depressed or hopeless? no  Q2: Over the past two weeks, have you felt little interest or pleasure in doing things? no   The following portions of the patient's history were reviewed and updated as appropriate: allergies, current medications, past family history, past medical history, past social history, past surgical history and problem list.  Past Medical History  Diagnosis Date  . DYSLIPIDEMIA 12/01/2006  . SMOKER 12/01/2006  . HYPERTENSION 12/01/2006  . OSTEOPOROSIS 12/01/2006  . HYPERGLYCEMIA 12/01/2006    Past Surgical History  Procedure Date  . Total abdominal hysterectomy w/ bilateral salpingoophorectomy 1980  . Abdominal hysterectomy     History   Social History  . Marital Status: Single    Spouse Name: N/A    Number of Children: N/A  . Years of Education: N/A   Occupational History  . Retired    Social History Main Topics  . Smoking status: Current Some Day Smoker  . Smokeless tobacco: Not on file  . Alcohol Use: Not on file  . Drug Use: Not on file  . Sexually Active:  Not on file   Other Topics Concern  . Not on file   Social History Narrative   Married   Divorced x many years. Current Outpatient Prescriptions on File Prior to Visit  Medication Sig Dispense Refill  . rosuvastatin (CRESTOR) 10 MG tablet Take 10 mg by mouth daily.        Marland Kitchen triamterene-hydrochlorothiazide (DYAZIDE) 37.5-25 MG per capsule Take 1/2 tablet once daily  30 capsule  1  . DISCONTD: alendronate (FOSAMAX) 70 MG tablet Take 70 mg by mouth every 7 (seven) days. Take with a full glass of water on an empty stomach.         Allergies  Allergen Reactions  . Losartan Potassium     REACTION: sob  . Olmesartan Medoxomil     REACTION: sob  . Simvastatin     REACTION: Malgias    Family History  Problem Relation Age of Onset  . Cancer Mother     "Throat" and colon cancer, both at an advanced age    BP 136/84  Pulse 82  Temp(Src) 98.2 F (36.8 C) (Oral)  Ht 5' (1.524 m)  Wt 151 lb 12.8 oz (68.856 kg)  BMI 29.65 kg/m2  SpO2 98%    Review of Systems  Denies hearing loss, and visual loss Objective:   Vision:  Sees opthalmologist Hearing: grossly normal Body mass index:  See vs page Msk: pt easily and quickly  performs "get-up-and-go" from a sitting position Cognitive Impairment Assessment: cognition, memory and judgment appear normal.  remembers 3/3 at 5 minutes.  excellent recall.  can easily read and write a sentence.  alert and oriented x 3   Assessment:   Medicare wellness utd on preventive parameters    Plan:   During the course of the visit the patient was educated and counseled about appropriate screening and preventive services including:       Fall prevention   Screening mammography: pt is up to date Bone densitometry screening: pt is up to date Diabetes screening  Nutrition counseling  We discussed smoking cessation.  Pt declines chantix.    Patient Instructions (the written plan) was given to the patient.        Review of Systems       Objective:   Physical Exam        Assessment & Plan:      SEPARATE EVALUATION FOLLOWS--EACH PROBLEM HERE IS NEW, NOT RESPONDING TO TREATMENT, OR POSES SIGNIFICANT RISK TO THE PATIENT'S HEALTH: HISTORY OF THE PRESENT ILLNESS: Pt states few years of intermittent rhinorrhea from the nose.  No assoc congestion. PAST MEDICAL HISTORY reviewed and up to date today REVIEW OF SYSTEMS: Denies earache, chest pain, weight change, and sob. PHYSICAL EXAMINATION: head: no deformity eyes: no periorbital swelling, no proptosis external nose and ears are normal mouth: no lesion seen Both eac's and tm's are normal IMPRESSION: Allergic rhinitis, new PLAN: See instruction page

## 2010-11-22 ENCOUNTER — Other Ambulatory Visit: Payer: Self-pay | Admitting: *Deleted

## 2010-11-22 MED ORDER — TRIAMTERENE-HCTZ 37.5-25 MG PO CAPS: ORAL_CAPSULE | ORAL | Status: DC

## 2010-11-22 NOTE — Telephone Encounter (Signed)
Rc'd fax from [facility] for refill of Triamterene-HCTZ refill  Last OV-09/14/2010  Last filled-11/19/2010

## 2011-01-04 ENCOUNTER — Telehealth: Payer: Self-pay

## 2011-01-04 NOTE — Telephone Encounter (Signed)
Pt informed of MD's advisement. 

## 2011-01-04 NOTE — Telephone Encounter (Signed)
Best option is to re-try at 1/2 tab qd.  Call if this helps

## 2011-01-04 NOTE — Telephone Encounter (Signed)
Pt called stating she recently developed sever muscle pain that she has associated with Crestor. Pt has stopped taking medications and cramps have resolved. Pt is now requesting an alternative medication.

## 2011-01-23 ENCOUNTER — Telehealth: Payer: Self-pay

## 2011-01-23 MED ORDER — TRIAMTERENE-HCTZ 37.5-25 MG PO CAPS: ORAL_CAPSULE | ORAL | Status: DC

## 2011-01-23 NOTE — Telephone Encounter (Signed)
Stop crestor.  If you feel better and want to try another med, please call back

## 2011-01-23 NOTE — Telephone Encounter (Signed)
Left message for pt to callback office.  

## 2011-01-23 NOTE — Telephone Encounter (Signed)
Pt called stating she is still experiencing SE with decreased dosage of Crestor, please advise.

## 2011-01-24 NOTE — Telephone Encounter (Signed)
Pt advised ad agrees

## 2011-02-20 ENCOUNTER — Encounter: Payer: Self-pay | Admitting: Endocrinology

## 2011-10-05 DIAGNOSIS — H402290 Chronic angle-closure glaucoma, unspecified eye, stage unspecified: Secondary | ICD-10-CM | POA: Diagnosis not present

## 2011-10-05 DIAGNOSIS — H409 Unspecified glaucoma: Secondary | ICD-10-CM | POA: Diagnosis not present

## 2011-10-05 DIAGNOSIS — Z961 Presence of intraocular lens: Secondary | ICD-10-CM | POA: Diagnosis not present

## 2011-11-14 ENCOUNTER — Ambulatory Visit (INDEPENDENT_AMBULATORY_CARE_PROVIDER_SITE_OTHER): Payer: Medicare Other | Admitting: Endocrinology

## 2011-11-14 ENCOUNTER — Encounter: Payer: Self-pay | Admitting: Endocrinology

## 2011-11-14 ENCOUNTER — Other Ambulatory Visit (INDEPENDENT_AMBULATORY_CARE_PROVIDER_SITE_OTHER): Payer: Medicare Other

## 2011-11-14 ENCOUNTER — Ambulatory Visit (INDEPENDENT_AMBULATORY_CARE_PROVIDER_SITE_OTHER)
Admission: RE | Admit: 2011-11-14 | Discharge: 2011-11-14 | Disposition: A | Discharge status: Home / Self Care | Payer: Medicare Other | Source: Ambulatory Visit

## 2011-11-14 VITALS — BP 118/82 | HR 89 | Temp 98.3°F | Ht 60.0 in | Wt 162.0 lb

## 2011-11-14 DIAGNOSIS — F17200 Nicotine dependence, unspecified, uncomplicated: Secondary | ICD-10-CM

## 2011-11-14 DIAGNOSIS — Z79899 Other long term (current) drug therapy: Secondary | ICD-10-CM

## 2011-11-14 DIAGNOSIS — E785 Hyperlipidemia, unspecified: Secondary | ICD-10-CM

## 2011-11-14 DIAGNOSIS — I10 Essential (primary) hypertension: Secondary | ICD-10-CM | POA: Diagnosis not present

## 2011-11-14 DIAGNOSIS — Z23 Encounter for immunization: Secondary | ICD-10-CM

## 2011-11-14 DIAGNOSIS — R7309 Other abnormal glucose: Secondary | ICD-10-CM

## 2011-11-14 DIAGNOSIS — Z0000 Encounter for general adult medical examination without abnormal findings: Secondary | ICD-10-CM

## 2011-11-14 DIAGNOSIS — M810 Age-related osteoporosis without current pathological fracture: Secondary | ICD-10-CM

## 2011-11-14 LAB — URINALYSIS, ROUTINE W REFLEX MICROSCOPIC
Nitrite: NEGATIVE
Specific Gravity, Urine: 1.025 (ref 1.000–1.030)
Total Protein, Urine: NEGATIVE
pH: 5.5 (ref 5.0–8.0)

## 2011-11-14 LAB — HEPATIC FUNCTION PANEL
ALT: 20 U/L (ref 0–35)
AST: 21 U/L (ref 0–37)
Alkaline Phosphatase: 94 U/L (ref 39–117)
Bilirubin, Direct: 0.1 mg/dL (ref 0.0–0.3)
Total Bilirubin: 0.8 mg/dL (ref 0.3–1.2)

## 2011-11-14 LAB — CBC WITH DIFFERENTIAL/PLATELET
Basophils Absolute: 0 10*3/uL (ref 0.0–0.1)
Eosinophils Relative: 2 % (ref 0.0–5.0)
HCT: 41.8 % (ref 36.0–46.0)
Lymphocytes Relative: 19.7 % (ref 12.0–46.0)
Lymphs Abs: 1.4 10*3/uL (ref 0.7–4.0)
Monocytes Relative: 9 % (ref 3.0–12.0)
Neutrophils Relative %: 68.6 % (ref 43.0–77.0)
Platelets: 313 10*3/uL (ref 150.0–400.0)
WBC: 7 10*3/uL (ref 4.5–10.5)

## 2011-11-14 LAB — BASIC METABOLIC PANEL
BUN: 17 mg/dL (ref 6–23)
Chloride: 105 mEq/L (ref 96–112)
GFR: 74.56 mL/min (ref >60.00)
Glucose, Bld: 103 mg/dL — ABNORMAL HIGH (ref 70–99)
Potassium: 4.4 mEq/L (ref 3.5–5.1)
Sodium: 139 mEq/L (ref 135–145)

## 2011-11-14 LAB — HEMOGLOBIN A1C: Hgb A1c MFr Bld: 5.6 % (ref 4.6–6.5)

## 2011-11-14 LAB — LDL CHOLESTEROL, DIRECT: Direct LDL: 158.9 mg/dL

## 2011-11-14 NOTE — Patient Instructions (Addendum)
please consider these measures for your health:  minimize alcohol.  do not use tobacco products.  have a colonoscopy at least every 10 years from age 75.  Women should have an annual mammogram from age 61.  keep firearms safely stored.  always use seat belts.  have working smoke alarms in your home.  see an eye doctor and dentist regularly.  never drive under the influence of alcohol or drugs (including prescription drugs).   please let me know what your wishes would be, if artificial life support measures should become necessary.  it is critically important to prevent falling down (keep floor areas well-lit, dry, and free of loose objects.  If you have a cane, walker, or wheelchair, you should use it, even for short trips around the house.  Also, try not to rush).  Please schedule a bone-density test.  Please return in 1 year.   (update: we discussed code status.  pt requests full code, but would not want to be started or maintained on artificial life-support measures if there was not a reasonable chance of recovery).

## 2011-11-14 NOTE — Progress Notes (Signed)
Subjective:    Patient ID: Alexandria Randolph, female    DOB: May 19, 1934, 77 y.o.   MRN: [id number]  HPI The state of at least three ongoing medical problems is addressed today: Pt states few mos of intermittent low-back pain, rad to the lateral aspect of the left thigh.    Past Medical History  Diagnosis Date  . DYSLIPIDEMIA 12/01/2006  . SMOKER 12/01/2006  . HYPERTENSION 12/01/2006  . OSTEOPOROSIS 12/01/2006  . HYPERGLYCEMIA 12/01/2006    Past Surgical History  Procedure Date  . Total abdominal hysterectomy w/ bilateral salpingoophorectomy 1980  . Abdominal hysterectomy     History   Social History  . Marital Status: Single    Spouse Name: N/A    Number of Children: N/A  . Years of Education: N/A   Occupational History  . Retired    Social History Main Topics  . Smoking status: Former Games developer  . Smokeless tobacco: Not on file  . Alcohol Use: Not on file  . Drug Use: Not on file  . Sexually Active: Not on file   Other Topics Concern  . Not on file   Social History Narrative   Married    Current Outpatient Prescriptions on File Prior to Visit  Medication Sig Dispense Refill  . aspirin 81 MG tablet Take 81 mg by mouth daily.        . fexofenadine (ALLEGRA) 180 MG tablet Take 180 mg by mouth as needed.      . colestipol (COLESTID) 1 G tablet Take 3 tablets (3 g total) by mouth daily.  90 tablet  11  . triamterene-hydrochlorothiazide (DYAZIDE) 37.5-25 MG per capsule Take 1/2 tablet once daily  30 capsule  1    Allergies  Allergen Reactions  . Losartan Potassium     REACTION: sob  . Olmesartan Medoxomil     REACTION: sob  . Simvastatin     REACTION: Malgias    Family History  Problem Relation Age of Onset  . Cancer Mother     "Throat" and colon cancer, both at an advanced age    BP 118/82  Pulse 89  Temp 98.3 F (36.8 C) (Oral)  Ht 5' (1.524 m)  Wt 162 lb (73.483 kg)  BMI 31.64 kg/m2  SpO2 97%  Review of Systems Denies weight change, chest pain,  and sob    Objective:   Physical Exam VITAL SIGNS:  See vs page GENERAL: no distress NECK: There is no palpable thyroid enlargement.  No thyroid nodule is palpable.  No palpable lymphadenopathy at the anterior neck. Pulses: dorsalis pedis intact bilat.  no carotid bruit LUNGS:  Clear to auscultation HEART:  Regular rate and rhythm without murmurs noted. Normal S1,S2.   Feet: no deformity.  no ulcer on the feet.  feet are of normal color and temp.  no edema Neuro: sensation is intact to touch on the feet   Lab Results  Component Value Date   WBC 7.0 11/14/2011   HGB 13.9 11/14/2011   HCT 41.8 11/14/2011   PLT 313.0 11/14/2011   GLUCOSE 103* 11/14/2011   CHOL 254* 11/14/2011   TRIG 184.0* 11/14/2011   HDL 81.30 11/14/2011   LDLDIRECT 158.9 11/14/2011   LDLCALC 81 09/14/2010   ALT 20 11/14/2011   AST 21 11/14/2011   NA 139 11/14/2011   K 4.4 11/14/2011   CL 105 11/14/2011   CREATININE 0.9 11/14/2011   BUN 17 11/14/2011   CO2 24 11/14/2011   TSH 1.85 11/14/2011  HGBA1C 5.6 11/14/2011      Assessment & Plan:  Low-back pain, with ? Of radiculopathy.  i offered x-rays Hyperglycemia, stable Dyslipidemia, well-controlled   Subjective:   Patient here for Medicare annual wellness visit and management of other chronic and acute problems.     Risk factors: advanced age    Roster of Physicians Providing Medical Care to Patient:  See "snapshot"   Activities of Daily Living: In your present state of health, do you have any difficulty performing the following activities?:  Preparing food and eating?: No  Bathing yourself: No  Getting dressed: No  Using the toilet:No  Moving around from place to place: No  In the past year have you fallen or had a near fall?: No    Home Safety: Has smoke detector and wears seat belts. No firearms.   Diet and Exercise  Current exercise habits: pt says not good. Dietary issues discussed: pt reports a healthy diet.   Depression Screen  Q1: Over the past  two weeks, have you felt down, depressed or hopeless?no  Q2: Over the past two weeks, have you felt little interest or pleasure in doing things? no   The following portions of the patient's history were reviewed and updated as appropriate: allergies, current medications, past family history, past medical history, past social history, past surgical history and problem list.  Past Medical History  Diagnosis Date  . DYSLIPIDEMIA 12/01/2006  . SMOKER 12/01/2006  . HYPERTENSION 12/01/2006  . OSTEOPOROSIS 12/01/2006  . HYPERGLYCEMIA 12/01/2006    Past Surgical History  Procedure Date  . Total abdominal hysterectomy w/ bilateral salpingoophorectomy 1980  . Abdominal hysterectomy     History   Social History  . Marital Status: Single    Spouse Name: N/A    Number of Children: N/A  . Years of Education: N/A   Occupational History  . Retired    Social History Main Topics  . Smoking status: Former Games developer  . Smokeless tobacco: Not on file  . Alcohol Use: Not on file  . Drug Use: Not on file  . Sexually Active: Not on file   Other Topics Concern  . Not on file   Social History Narrative   Married    Current Outpatient Prescriptions on File Prior to Visit  Medication Sig Dispense Refill  . aspirin 81 MG tablet Take 81 mg by mouth daily.        . fexofenadine (ALLEGRA) 180 MG tablet Take 180 mg by mouth as needed.      . triamterene-hydrochlorothiazide (DYAZIDE) 37.5-25 MG per capsule Take 1/2 tablet once daily  30 capsule  1    Allergies  Allergen Reactions  . Losartan Potassium     REACTION: sob  . Olmesartan Medoxomil     REACTION: sob  . Simvastatin     REACTION: Malgias    Family History  Problem Relation Age of Onset  . Cancer Mother     "Throat" and colon cancer, both at an advanced age    BP 118/82  Pulse 89  Temp 98.3 F (36.8 C) (Oral)  Ht 5' (1.524 m)  Wt 162 lb (73.483 kg)  BMI 31.64 kg/m2  SpO2 97%  Review of Systems  Denies hearing loss, and  visual loss Objective:   Vision:  Sees opthalmologist Hearing: grossly normal Body mass index:  See vs page Msk: pt easily and quickly performs "get-up-and-go" from a sitting position Cognitive Impairment Assessment: cognition, memory and judgment appear normal.  remembers  3/3 at 5 minutes.  excellent recall.  can easily read and write a sentence.  alert and oriented x 3.     Assessment:   Medicare wellness utd on preventive parameters.     Plan:   During the course of the visit the patient was educated and counseled about appropriate screening and preventive services including:        Fall prevention   Screening mammography  Bone densitometry screening  Diabetes screening  Nutrition counseling   Vaccines / LABS Zostavax / Pnemonccoal Vaccine  today   Patient Instructions (the written plan) was given to the patient.

## 2011-11-15 ENCOUNTER — Telehealth: Payer: Self-pay | Admitting: *Deleted

## 2011-11-15 MED ORDER — COLESTIPOL HCL 1 G PO TABS: 3.0000 g | ORAL_TABLET | Freq: Every day | ORAL | Status: DC

## 2011-11-15 NOTE — Telephone Encounter (Signed)
i changed and sent rx 

## 2011-11-15 NOTE — Telephone Encounter (Signed)
Called pt to inform of lab results, pt informed (letter also mailed to pt). Pt states that she is willing to try another cholesterol medication.

## 2011-11-16 NOTE — Telephone Encounter (Signed)
Pt informed of new rx for cholesterol. 

## 2011-11-20 ENCOUNTER — Telehealth: Payer: Self-pay | Admitting: Endocrinology

## 2011-11-20 NOTE — Telephone Encounter (Signed)
please call patient: Is your back still bothering you?  If so, i would be happy to order x-rays

## 2011-11-21 NOTE — Telephone Encounter (Signed)
LMOVM for Pt to call back.  

## 2011-11-22 ENCOUNTER — Encounter: Payer: Self-pay | Admitting: Endocrinology

## 2012-02-09 DIAGNOSIS — Z1231 Encounter for screening mammogram for malignant neoplasm of breast: Secondary | ICD-10-CM | POA: Diagnosis not present

## 2012-06-03 DIAGNOSIS — H409 Unspecified glaucoma: Secondary | ICD-10-CM | POA: Diagnosis not present

## 2012-06-03 DIAGNOSIS — H402290 Chronic angle-closure glaucoma, unspecified eye, stage unspecified: Secondary | ICD-10-CM | POA: Diagnosis not present

## 2012-07-26 ENCOUNTER — Encounter: Payer: Self-pay | Admitting: Endocrinology

## 2012-11-14 ENCOUNTER — Encounter: Payer: Medicare Other | Admitting: Endocrinology

## 2012-11-27 ENCOUNTER — Ambulatory Visit (INDEPENDENT_AMBULATORY_CARE_PROVIDER_SITE_OTHER): Payer: Medicare Other | Admitting: Endocrinology

## 2012-11-27 ENCOUNTER — Encounter: Payer: Self-pay | Admitting: Endocrinology

## 2012-11-27 VITALS — BP 126/80 | HR 98 | Ht 60.0 in | Wt 163.0 lb

## 2012-11-27 DIAGNOSIS — M810 Age-related osteoporosis without current pathological fracture: Secondary | ICD-10-CM | POA: Diagnosis not present

## 2012-11-27 DIAGNOSIS — Z0000 Encounter for general adult medical examination without abnormal findings: Secondary | ICD-10-CM

## 2012-11-27 DIAGNOSIS — IMO0002 Reserved for concepts with insufficient information to code with codable children: Secondary | ICD-10-CM

## 2012-11-27 DIAGNOSIS — M1711 Unilateral primary osteoarthritis, right knee: Secondary | ICD-10-CM

## 2012-11-27 DIAGNOSIS — M1710 Unilateral primary osteoarthritis, unspecified knee: Secondary | ICD-10-CM

## 2012-11-27 DIAGNOSIS — R7309 Other abnormal glucose: Secondary | ICD-10-CM | POA: Diagnosis not present

## 2012-11-27 DIAGNOSIS — Z79899 Other long term (current) drug therapy: Secondary | ICD-10-CM

## 2012-11-27 DIAGNOSIS — I10 Essential (primary) hypertension: Secondary | ICD-10-CM | POA: Diagnosis not present

## 2012-11-27 DIAGNOSIS — J309 Allergic rhinitis, unspecified: Secondary | ICD-10-CM

## 2012-11-27 LAB — URINALYSIS, ROUTINE W REFLEX MICROSCOPIC
Bilirubin Urine: NEGATIVE
Hgb urine dipstick: NEGATIVE
Nitrite: NEGATIVE
Urobilinogen, UA: 0.2 (ref 0.0–1.0)

## 2012-11-27 LAB — HEPATIC FUNCTION PANEL
ALT: 21 U/L (ref 0–35)
Alkaline Phosphatase: 93 U/L (ref 39–117)
Bilirubin, Direct: 0 mg/dL (ref 0.0–0.3)
Total Bilirubin: 0.7 mg/dL (ref 0.3–1.2)
Total Protein: 8.1 g/dL (ref 6.0–8.3)

## 2012-11-27 LAB — CBC WITH DIFFERENTIAL/PLATELET
Basophils Absolute: 0 10*3/uL (ref 0.0–0.1)
HCT: 41.6 % (ref 36.0–46.0)
Lymphs Abs: 1.5 10*3/uL (ref 0.7–4.0)
MCV: 88.4 fl (ref 78.0–100.0)
Monocytes Absolute: 0.5 10*3/uL (ref 0.1–1.0)
Neutrophils Relative %: 63.1 % (ref 43.0–77.0)
Platelets: 329 10*3/uL (ref 150.0–400.0)
RDW: 13.4 % (ref 11.5–14.6)
WBC: 5.7 10*3/uL (ref 4.5–10.5)

## 2012-11-27 LAB — BASIC METABOLIC PANEL
CO2: 23 mEq/L (ref 19–32)
Calcium: 9.5 mg/dL (ref 8.4–10.5)
Creatinine, Ser: 0.9 mg/dL (ref 0.4–1.2)
GFR: 80.24 mL/min (ref >60.00)

## 2012-11-27 LAB — LIPID PANEL
Cholesterol: 267 mg/dL — ABNORMAL HIGH (ref 0–200)
HDL: 63.8 mg/dL (ref >39.00)
Total CHOL/HDL Ratio: 4
Triglycerides: 206 mg/dL — ABNORMAL HIGH (ref 0.0–149.0)
VLDL: 41.2 mg/dL — ABNORMAL HIGH (ref 0.0–40.0)

## 2012-11-27 LAB — TSH: TSH: 1.27 u[IU]/mL (ref 0.35–5.50)

## 2012-11-27 MED ORDER — FLUTICASONE PROPIONATE 50 MCG/ACT NA SUSP: 2.0000 | Freq: Every day | NASAL | Status: DC

## 2012-11-27 NOTE — Patient Instructions (Addendum)
please consider these measures for your health:  minimize alcohol.  do not use tobacco products.  have a colonoscopy at least every 10 years from age 76.  Women should have an annual mammogram from age 22.  Keep any firearms safely stored.  always use seat belts.  have working smoke alarms in your home.  see an eye doctor and dentist regularly.  never drive under the influence of alcohol or drugs (including prescription drugs).   i have sent a prescription to your pharmacy, for a nasal spray for the allergy symptoms.  blood tests are being requested for you today.  We'll contact you with results.  it is critically important to prevent falling down (keep floor areas well-lit, dry, and free of loose objects.  If you have a cane, walker, or wheelchair, you should use it, even for short trips around the house.  Also, try not to rush).   good diet and exercise habits significanly improve the control of your blood sugar.  please let me know if you wish to be referred to a dietician.   Please return in 1 year.

## 2012-11-27 NOTE — Progress Notes (Signed)
Subjective:    Patient ID: Alexandria Randolph, female    DOB: 1935-02-06, 78 y.o.   MRN: [id number]  HPI Pt states many years of intermittent congestion in the nose, and assoc sneezing.   She denies chest pain, sob, and weight change. Past Medical History  Diagnosis Date  . DYSLIPIDEMIA 12/01/2006  . SMOKER 12/01/2006  . HYPERTENSION 12/01/2006  . OSTEOPOROSIS 12/01/2006  . HYPERGLYCEMIA 12/01/2006    Past Surgical History  Procedure Laterality Date  . Total abdominal hysterectomy w/ bilateral salpingoophorectomy  1980  . Abdominal hysterectomy      History   Social History  . Marital Status: Single    Spouse Name: N/A    Number of Children: N/A  . Years of Education: N/A   Occupational History  . Retired    Social History Main Topics  . Smoking status: Former Games developer  . Smokeless tobacco: Not on file  . Alcohol Use: Not on file  . Drug Use: Not on file  . Sexual Activity: Not on file   Other Topics Concern  . Not on file   Social History Narrative   Married    Current Outpatient Prescriptions on File Prior to Visit  Medication Sig Dispense Refill  . aspirin 81 MG tablet Take 81 mg by mouth daily.        . fexofenadine (ALLEGRA) 180 MG tablet Take 180 mg by mouth as needed.      . naproxen sodium (ALEVE) 220 MG tablet Take 220 mg by mouth as needed.      . triamterene-hydrochlorothiazide (DYAZIDE) 37.5-25 MG per capsule Take 1/2 tablet once daily  30 capsule  1  . colestipol (COLESTID) 1 G tablet Take 3 tablets (3 g total) by mouth daily.  90 tablet  11   No current facility-administered medications on file prior to visit.    Allergies  Allergen Reactions  . Losartan Potassium     REACTION: sob  . Olmesartan Medoxomil     REACTION: sob  . Simvastatin     REACTION: Malgias    Family History  Problem Relation Age of Onset  . Cancer Mother     "Throat" and colon cancer, both at an advanced age    BP 126/80  Pulse 98  Ht 5' (1.524 m)  Wt 163 lb (73.936  kg)  BMI 31.83 kg/m2  SpO2 98%    Review of Systems Denies numbness and LOC    Objective:   Physical Exam VS: see vs page GEN: no distress HEAD: head: no deformity eyes: no periorbital swelling, no proptosis external nose and ears are normal mouth: no lesion seen NECK: supple, thyroid is not enlarged.   CHEST WALL: no deformity.  LUNGS:  Clear to auscultation. CV: reg rate and rhythm, no murmur.   ABD: abdomen is soft, nontender.  no hepatosplenomegaly.  not distended.  no hernia. MUSCULOSKELETAL: muscle bulk and strength are grossly normal.  no obvious joint swelling.  gait is normal and steady.   EXTEMITIES: no deformity.  no ulcer on the feet.  feet are of normal color and temp.  no edema PULSES: dorsalis pedis intact bilat.  no carotid bruit.   NEURO:  cn 2-12 grossly intact.   readily moves all 4's.  sensation is intact to touch on the feet SKIN:  Normal texture and temperature.  No rash or suspicious lesion is visible.   NODES:  None palpable at the neck.   PSYCH: alert, oriented x3.  Does not appear  anxious nor depressed.      Lab Results  Component Value Date   WBC 5.7 11/27/2012   HGB 13.9 11/27/2012   HCT 41.6 11/27/2012   PLT 329.0 11/27/2012   GLUCOSE 117* 11/27/2012   CHOL 267* 11/27/2012   TRIG 206.0* 11/27/2012   HDL 63.80 11/27/2012   LDLDIRECT 184.7 11/27/2012   LDLCALC 81 09/14/2010   ALT 21 11/27/2012   AST 23 11/27/2012   NA 139 11/27/2012   K 4.0 11/27/2012   CL 106 11/27/2012   CREATININE 0.9 11/27/2012   BUN 14 11/27/2012   CO2 23 11/27/2012   TSH 1.27 11/27/2012   HGBA1C 5.8 11/27/2012      Assessment & Plan:  Allergic rhinitis, she needs increased rx Hyperglycemia, stable. Dyslipidemia: given this is non-fasting, good results.  Subjective:   Patient here for Medicare annual wellness visit and management of other chronic and acute problems.     Risk factors: advanced age    Roster of Physicians Providing Medical Care to Patient:  See  "snapshot"   Activities of Daily Living: In your present state of health, do you have any difficulty performing the following activities?:  Preparing food and eating?: No  Bathing yourself: No  Getting dressed: No  Using the toilet:No  Moving around from place to place: No  In the past year have you fallen or had a near fall?: No    Home Safety: Has smoke detector and wears seat belts. No firearms.  Diet and Exercise  Current exercise habits: pt says good Dietary issues discussed: pt reports a healthy diet.    Depression Screen  Q1: Over the past two weeks, have you felt down, depressed or hopeless? no  Q2: Over the past two weeks, have you felt little interest or pleasure in doing things? no   The following portions of the patient's history were reviewed and updated as appropriate: allergies, current medications, past family history, past medical history, past social history, past surgical history and problem list.  Past Medical History  Diagnosis Date  . DYSLIPIDEMIA 12/01/2006  . SMOKER 12/01/2006  . HYPERTENSION 12/01/2006  . OSTEOPOROSIS 12/01/2006  . HYPERGLYCEMIA 12/01/2006    Past Surgical History  Procedure Laterality Date  . Total abdominal hysterectomy w/ bilateral salpingoophorectomy  1980  . Abdominal hysterectomy      History   Social History  . Marital Status: Single    Spouse Name: N/A    Number of Children: N/A  . Years of Education: N/A   Occupational History  . Retired    Social History Main Topics  . Smoking status: Former Games developer  . Smokeless tobacco: Not on file  . Alcohol Use: Not on file  . Drug Use: Not on file  . Sexual Activity: Not on file   Other Topics Concern  . Not on file   Social History Narrative   Married    Current Outpatient Prescriptions on File Prior to Visit  Medication Sig Dispense Refill  . aspirin 81 MG tablet Take 81 mg by mouth daily.        . fexofenadine (ALLEGRA) 180 MG tablet Take 180 mg by mouth as needed.       . naproxen sodium (ALEVE) 220 MG tablet Take 220 mg by mouth as needed.      . triamterene-hydrochlorothiazide (DYAZIDE) 37.5-25 MG per capsule Take 1/2 tablet once daily  30 capsule  1  . colestipol (COLESTID) 1 G tablet Take 3 tablets (3 g total) by  mouth daily.  90 tablet  11   No current facility-administered medications on file prior to visit.    Allergies  Allergen Reactions  . Losartan Potassium     REACTION: sob  . Olmesartan Medoxomil     REACTION: sob  . Simvastatin     REACTION: Malgias    Family History  Problem Relation Age of Onset  . Cancer Mother     "Throat" and colon cancer, both at an advanced age    BP 126/80  Pulse 98  Ht 5' (1.524 m)  Wt 163 lb (73.936 kg)  BMI 31.83 kg/m2  SpO2 98%  Review of Systems  Denies hearing loss, and visual loss Objective:   Vision:  Sees opthalmologist Hearing: grossly normal Body mass index:  See vs page Msk: pt easily and quickly performs "get-up-and-go" from a sitting position Cognitive Impairment Assessment: cognition, memory and judgment appear normal.  remembers 3/3 at 5 minutes.  excellent recall.  can easily read and write a sentence.  alert and oriented x 3.    Assessment:   Medicare wellness utd on preventive parameters.     Plan:   During the course of the visit the patient was educated and counseled about appropriate screening and preventive services including:        Fall prevention   Screening mammography  Bone densitometry screening  Diabetes screening  Nutrition counseling   Vaccines / LABS Zostavax / Pnemonccoal Vaccine  today  PSA  Patient Instructions (the written plan) was given to the patient.

## 2013-08-15 DIAGNOSIS — Z803 Family history of malignant neoplasm of breast: Secondary | ICD-10-CM | POA: Diagnosis not present

## 2013-08-15 DIAGNOSIS — Z1231 Encounter for screening mammogram for malignant neoplasm of breast: Secondary | ICD-10-CM | POA: Diagnosis not present

## 2013-09-30 ENCOUNTER — Encounter: Payer: Self-pay | Admitting: Endocrinology

## 2013-10-21 DIAGNOSIS — H02839 Dermatochalasis of unspecified eye, unspecified eyelid: Secondary | ICD-10-CM | POA: Diagnosis not present

## 2013-10-21 DIAGNOSIS — Z961 Presence of intraocular lens: Secondary | ICD-10-CM | POA: Diagnosis not present

## 2013-10-21 DIAGNOSIS — H4020X0 Unspecified primary angle-closure glaucoma, stage unspecified: Secondary | ICD-10-CM | POA: Diagnosis not present

## 2013-11-27 ENCOUNTER — Encounter: Payer: Medicare Other | Admitting: Endocrinology

## 2013-12-05 ENCOUNTER — Encounter: Payer: Self-pay | Admitting: Endocrinology

## 2013-12-05 ENCOUNTER — Ambulatory Visit (INDEPENDENT_AMBULATORY_CARE_PROVIDER_SITE_OTHER): Payer: Medicare Other | Admitting: Endocrinology

## 2013-12-05 VITALS — BP 136/88 | HR 69 | Temp 98.3°F | Ht 60.0 in | Wt 161.0 lb

## 2013-12-05 DIAGNOSIS — M810 Age-related osteoporosis without current pathological fracture: Secondary | ICD-10-CM | POA: Diagnosis not present

## 2013-12-05 DIAGNOSIS — Z79899 Other long term (current) drug therapy: Secondary | ICD-10-CM

## 2013-12-05 DIAGNOSIS — M25559 Pain in unspecified hip: Secondary | ICD-10-CM | POA: Diagnosis not present

## 2013-12-05 DIAGNOSIS — Z0000 Encounter for general adult medical examination without abnormal findings: Secondary | ICD-10-CM | POA: Diagnosis not present

## 2013-12-05 DIAGNOSIS — R7309 Other abnormal glucose: Secondary | ICD-10-CM | POA: Diagnosis not present

## 2013-12-05 DIAGNOSIS — I10 Essential (primary) hypertension: Secondary | ICD-10-CM | POA: Diagnosis not present

## 2013-12-05 DIAGNOSIS — E785 Hyperlipidemia, unspecified: Secondary | ICD-10-CM | POA: Diagnosis not present

## 2013-12-05 DIAGNOSIS — Z23 Encounter for immunization: Secondary | ICD-10-CM

## 2013-12-05 DIAGNOSIS — M25551 Pain in right hip: Secondary | ICD-10-CM

## 2013-12-05 LAB — URINALYSIS, ROUTINE W REFLEX MICROSCOPIC
Bilirubin Urine: NEGATIVE
Hgb urine dipstick: NEGATIVE
Ketones, ur: NEGATIVE
Nitrite: NEGATIVE
RBC / HPF: NONE SEEN (ref 0–?)
SPECIFIC GRAVITY, URINE: 1.015 (ref 1.000–1.030)
TOTAL PROTEIN, URINE-UPE24: NEGATIVE
URINE GLUCOSE: NEGATIVE
Urobilinogen, UA: 1 (ref 0.0–1.0)
pH: 6 (ref 5.0–8.0)

## 2013-12-05 LAB — BASIC METABOLIC PANEL
BUN: 12 mg/dL (ref 6–23)
CO2: 27 mEq/L (ref 19–32)
Calcium: 9.5 mg/dL (ref 8.4–10.5)
Chloride: 106 mEq/L (ref 96–112)
Creatinine, Ser: 0.8 mg/dL (ref 0.4–1.2)
GFR: 84.43 mL/min (ref >60.00)
Glucose, Bld: 109 mg/dL — ABNORMAL HIGH (ref 70–99)
POTASSIUM: 3.7 meq/L (ref 3.5–5.1)
SODIUM: 142 meq/L (ref 135–145)

## 2013-12-05 LAB — CBC WITH DIFFERENTIAL/PLATELET
Basophils Absolute: 0 10*3/uL (ref 0.0–0.1)
Basophils Relative: 0.5 % (ref 0.0–3.0)
EOS ABS: 0.1 10*3/uL (ref 0.0–0.7)
Eosinophils Relative: 2.5 % (ref 0.0–5.0)
HCT: 40.5 % (ref 36.0–46.0)
HEMOGLOBIN: 13.4 g/dL (ref 12.0–15.0)
Lymphocytes Relative: 22 % (ref 12.0–46.0)
Lymphs Abs: 1.1 10*3/uL (ref 0.7–4.0)
MCHC: 33 g/dL (ref 30.0–36.0)
MCV: 90.2 fl (ref 78.0–100.0)
MONOS PCT: 7.9 % (ref 3.0–12.0)
Monocytes Absolute: 0.4 10*3/uL (ref 0.1–1.0)
NEUTROS ABS: 3.5 10*3/uL (ref 1.4–7.7)
Neutrophils Relative %: 67.1 % (ref 43.0–77.0)
Platelets: 332 10*3/uL (ref 150.0–400.0)
RBC: 4.49 Mil/uL (ref 3.87–5.11)
RDW: 13.8 % (ref 11.5–15.5)
WBC: 5.2 10*3/uL (ref 4.0–10.5)

## 2013-12-05 LAB — LIPID PANEL
Cholesterol: 230 mg/dL — ABNORMAL HIGH (ref 0–200)
HDL: 62.1 mg/dL (ref >39.00)
LDL Cholesterol: 138 mg/dL — ABNORMAL HIGH (ref 0–99)
NonHDL: 167.9
TRIGLYCERIDES: 149 mg/dL (ref 0.0–149.0)
Total CHOL/HDL Ratio: 4
VLDL: 29.8 mg/dL (ref 0.0–40.0)

## 2013-12-05 LAB — HEMOGLOBIN A1C: Hgb A1c MFr Bld: 5.6 % (ref 4.6–6.5)

## 2013-12-05 LAB — HEPATIC FUNCTION PANEL
ALBUMIN: 4 g/dL (ref 3.5–5.2)
ALK PHOS: 105 U/L (ref 39–117)
ALT: 16 U/L (ref 0–35)
AST: 20 U/L (ref 0–37)
BILIRUBIN TOTAL: 0.4 mg/dL (ref 0.2–1.2)
Bilirubin, Direct: 0 mg/dL (ref 0.0–0.3)
Total Protein: 7.8 g/dL (ref 6.0–8.3)

## 2013-12-05 LAB — TSH: TSH: 0.52 u[IU]/mL (ref 0.35–4.50)

## 2013-12-05 LAB — SEDIMENTATION RATE: SED RATE: 34 mm/h — AB (ref 0–22)

## 2013-12-05 LAB — VITAMIN D 25 HYDROXY (VIT D DEFICIENCY, FRACTURES): VITD: 32.33 ng/mL (ref 30.00–100.00)

## 2013-12-05 NOTE — Patient Instructions (Addendum)
please consider these measures for your health:  minimize alcohol.  do not use tobacco products.  have a colonoscopy at least every 10 years from age 77.  Women should have an annual mammogram from age 36.  keep firearms safely stored.  always use seat belts.  have working smoke alarms in your home.  see an eye doctor and dentist regularly.  never drive under the influence of alcohol or drugs (including prescription drugs).   it is critically important to prevent falling down (keep floor areas well-lit, dry, and free of loose objects.  If you have a cane, walker, or wheelchair, you should use it, even for short trips around the house.  Also, try not to rush) Please return in 1 year.

## 2013-12-05 NOTE — Progress Notes (Signed)
Subjective:    Patient ID: Alexandria Randolph, female    DOB: September 30, 1934, 79 y.o.   MRN: [id number]  HPI Pt states 1 year of moderate pain at the right hip and knee, but no assoc numbness.  Pain is worse with walking, or after prolonged sitting. Past Medical History  Diagnosis Date  . DYSLIPIDEMIA 12/01/2006  . SMOKER 12/01/2006  . HYPERTENSION 12/01/2006  . OSTEOPOROSIS 12/01/2006  . HYPERGLYCEMIA 12/01/2006    Past Surgical History  Procedure Laterality Date  . Total abdominal hysterectomy w/ bilateral salpingoophorectomy  1980  . Abdominal hysterectomy      History   Social History  . Marital Status: Single    Spouse Name: N/A    Number of Children: N/A  . Years of Education: N/A   Occupational History  . Retired    Social History Main Topics  . Smoking status: Former Research scientist (life sciences)  . Smokeless tobacco: Not on file  . Alcohol Use: Not on file  . Drug Use: Not on file  . Sexual Activity: Not on file   Other Topics Concern  . Not on file   Social History Narrative   Married    Current Outpatient Prescriptions on File Prior to Visit  Medication Sig Dispense Refill  . aspirin 81 MG tablet Take 81 mg by mouth daily.        . colestipol (COLESTID) 1 G tablet Take 3 tablets (3 g total) by mouth daily.  90 tablet  11  . fexofenadine (ALLEGRA) 180 MG tablet Take 180 mg by mouth as needed.      . fluticasone (FLONASE) 50 MCG/ACT nasal spray Place 2 sprays into the nose daily.  16 g  6  . naproxen sodium (ALEVE) 220 MG tablet Take 220 mg by mouth as needed.      . triamterene-hydrochlorothiazide (DYAZIDE) 37.5-25 MG per capsule Take 1/2 tablet once daily  30 capsule  1   No current facility-administered medications on file prior to visit.    Allergies  Allergen Reactions  . Losartan Potassium     REACTION: sob  . Olmesartan Medoxomil     REACTION: sob  . Simvastatin     REACTION: Malgias    Family History  Problem Relation Age of Onset  . Cancer Mother     "Throat"  and colon cancer, both at an advanced age    BP 136/88  Pulse 69  Temp(Src) 98.3 F (36.8 C)  Ht 5' (1.524 m)  Wt 161 lb (73.029 kg)  BMI 31.44 kg/m2  SpO2 97%  Review of Systems Denies sob and cough.     Objective:   Physical Exam VITAL SIGNS:  See vs page GENERAL: no distress Gait: favors RLE Right hip: nontender.    Lab Results  Component Value Date   WBC 5.2 12/05/2013   HGB 13.4 12/05/2013   HCT 40.5 12/05/2013   PLT 332.0 12/05/2013   GLUCOSE 109* 12/05/2013   CHOL 230* 12/05/2013   TRIG 149.0 12/05/2013   HDL 62.10 12/05/2013   LDLDIRECT 184.7 11/27/2012   LDLCALC 138* 12/05/2013   ALT 16 12/05/2013   AST 20 12/05/2013   NA 142 12/05/2013   K 3.7 12/05/2013   CL 106 12/05/2013   CREATININE 0.8 12/05/2013   BUN 12 12/05/2013   CO2 27 12/05/2013   TSH 0.52 12/05/2013   HGBA1C 5.6 12/05/2013       Assessment & Plan:  Leg pain, new. Dyslipidemia, persistent.  therapy is limited by  multiple perceived drug intolerances. Hyperglycemia, stable.   Subjective:   Patient here for Medicare annual wellness visit and management of other chronic and acute problems.     Risk factors: advanced age    54 of Physicians Providing Medical Care to Patient:  See "snapshot"   Activities of Daily Living: In your present state of health, do you have any difficulty performing the following activities?:  Preparing food and eating?: No  Bathing yourself: No  Getting dressed: No  Using the toilet:No  Moving around from place to place: No  In the past year have you fallen or had a near fall?: No    Home Safety: Has smoke detector and wears seat belts. No firearms.   Diet and Exercise  Current exercise habits: limited by pain Dietary issues discussed: pt reports a healthy diet   Depression Screen  Q1: Over the past two weeks, have you felt down, depressed or hopeless?no  Q2: Over the past two weeks, have you felt little interest or pleasure in doing things? no   The following  portions of the patient's history were reviewed and updated as appropriate: allergies, current medications, past family history, past medical history, past social history, past surgical history and problem list.  Past Medical History  Diagnosis Date  . DYSLIPIDEMIA 12/01/2006  . SMOKER 12/01/2006  . HYPERTENSION 12/01/2006  . OSTEOPOROSIS 12/01/2006  . HYPERGLYCEMIA 12/01/2006    Past Surgical History  Procedure Laterality Date  . Total abdominal hysterectomy w/ bilateral salpingoophorectomy  1980  . Abdominal hysterectomy      History   Social History  . Marital Status: Single    Spouse Name: N/A    Number of Children: N/A  . Years of Education: N/A   Occupational History  . Retired    Social History Main Topics  . Smoking status: Former Research scientist (life sciences)  . Smokeless tobacco: Not on file  . Alcohol Use: Not on file  . Drug Use: Not on file  . Sexual Activity: Not on file   Other Topics Concern  . Not on file   Social History Narrative   Married    Current Outpatient Prescriptions on File Prior to Visit  Medication Sig Dispense Refill  . aspirin 81 MG tablet Take 81 mg by mouth daily.        . colestipol (COLESTID) 1 G tablet Take 3 tablets (3 g total) by mouth daily.  90 tablet  11  . fexofenadine (ALLEGRA) 180 MG tablet Take 180 mg by mouth as needed.      . fluticasone (FLONASE) 50 MCG/ACT nasal spray Place 2 sprays into the nose daily.  16 g  6  . naproxen sodium (ALEVE) 220 MG tablet Take 220 mg by mouth as needed.      . triamterene-hydrochlorothiazide (DYAZIDE) 37.5-25 MG per capsule Take 1/2 tablet once daily  30 capsule  1   No current facility-administered medications on file prior to visit.    Allergies  Allergen Reactions  . Losartan Potassium     REACTION: sob  . Olmesartan Medoxomil     REACTION: sob  . Simvastatin     REACTION: Malgias    Family History  Problem Relation Age of Onset  . Cancer Mother     "Throat" and colon cancer, both at an advanced  age    BP 136/88  Pulse 69  Temp(Src) 98.3 F (36.8 C)  Ht 5' (1.524 m)  Wt 161 lb (73.029 kg)  BMI 31.44 kg/m2  SpO2 97%   Review of Systems  Denies hearing loss, and visual loss Objective:   Vision:  Sees opthalmologist Hearing: grossly normal Body mass index:  See vs page Msk: "get-up-and-go" from a sitting position is slightly limited by pain Cognitive Impairment Assessment: cognition, memory and judgment appear normal.  remembers 3/3 at 5 minutes.  excellent recall.  can easily read and write a sentence.  alert and oriented x 3.     Assessment:   Medicare wellness utd on preventive parameters    Plan:   During the course of the visit the patient was educated and counseled about appropriate screening and preventive services including:        Fall prevention   Screening mammography  Bone densitometry screening  Diabetes screening  Nutrition counseling   Vaccines / LABS Zostavax / Pneumococcal Vaccine  Today.    Patient Instructions (the written plan) was given to the patient.   we discussed code status.  pt requests full code, but would not want to be started or maintained on artificial life-support measures if there was not a reasonable chance of recovery  Patient is advised the following: Patient Instructions  please consider these measures for your health:  minimize alcohol.  do not use tobacco products.  have a colonoscopy at least every 10 years from age 21.  Women should have an annual mammogram from age 53.  keep firearms safely stored.  always use seat belts.  have working smoke alarms in your home.  see an eye doctor and dentist regularly.  never drive under the influence of alcohol or drugs (including prescription drugs).   it is critically important to prevent falling down (keep floor areas well-lit, dry, and free of loose objects.  If you have a cane, walker, or wheelchair, you should use it, even for short trips around the house.  Also, try not to  rush) Please return in 1 year.

## 2013-12-08 LAB — PTH, INTACT AND CALCIUM
Calcium: 9.6 mg/dL (ref 8.4–10.5)
PTH: 33 pg/mL (ref 14–64)

## 2013-12-12 ENCOUNTER — Ambulatory Visit (INDEPENDENT_AMBULATORY_CARE_PROVIDER_SITE_OTHER)
Admission: RE | Admit: 2013-12-12 | Discharge: 2013-12-12 | Disposition: A | Discharge status: Home / Self Care | Payer: Medicare Other | Source: Ambulatory Visit | Attending: Endocrinology | Admitting: Endocrinology

## 2013-12-12 DIAGNOSIS — M810 Age-related osteoporosis without current pathological fracture: Secondary | ICD-10-CM | POA: Diagnosis not present

## 2014-01-20 DIAGNOSIS — Z961 Presence of intraocular lens: Secondary | ICD-10-CM | POA: Diagnosis not present

## 2014-01-20 DIAGNOSIS — H40013 Open angle with borderline findings, low risk, bilateral: Secondary | ICD-10-CM | POA: Diagnosis not present

## 2014-02-18 ENCOUNTER — Ambulatory Visit (INDEPENDENT_AMBULATORY_CARE_PROVIDER_SITE_OTHER)
Admission: RE | Admit: 2014-02-18 | Discharge: 2014-02-18 | Disposition: A | Discharge status: Home / Self Care | Payer: Medicare Other | Source: Ambulatory Visit | Attending: Family Medicine | Admitting: Family Medicine

## 2014-02-18 ENCOUNTER — Encounter: Payer: Self-pay | Admitting: Family Medicine

## 2014-02-18 ENCOUNTER — Ambulatory Visit (INDEPENDENT_AMBULATORY_CARE_PROVIDER_SITE_OTHER): Payer: Medicare Other | Admitting: Family Medicine

## 2014-02-18 VITALS — BP 140/88 | HR 102 | Ht 60.0 in | Wt 155.0 lb

## 2014-02-18 DIAGNOSIS — M1611 Unilateral primary osteoarthritis, right hip: Secondary | ICD-10-CM

## 2014-02-18 DIAGNOSIS — M1990 Unspecified osteoarthritis, unspecified site: Secondary | ICD-10-CM | POA: Diagnosis not present

## 2014-02-18 DIAGNOSIS — M25551 Pain in right hip: Secondary | ICD-10-CM

## 2014-02-18 NOTE — Assessment & Plan Note (Signed)
Patient does have right hip arthritis. Patient does have mild decrease in internal rotation in x-rays from 2011 showed moderate x-ray since of arthritis at that time. We will get repeat x-rays to see if there is been any significant progression. Pain is not stopping her from any activities of daily living and is allowing her to rest comfortably. If patient has any acute worsening pain and then we need to consider further intervention. Patient try over-the-counter medicines. Icing protocol. We discussed proper shoewear. We discussed cross training with avoiding significant amount walking. Patient will try these different changes. Continuing to have pain we may need to consider intra-articular injections for symptomatically relief. Patient is having severe pain that starts affecting her daily activities tend surgical intervention may be best.

## 2014-02-18 NOTE — Progress Notes (Signed)
  [facility] [address] [place], [place] [address] Phone: [phone] Subjective:    I'm seeing this patient by the request  of:  Renato Shin, MD   CC: Right hip pain  [id number] Alexandria Randolph is a 79 y.o. female coming in with complaint of right hip pain. Patient has had this pain for months if not years. Patient states it hurts more in her groin and is worse with ambulation. States that it can be very painful when going up or downstairs. Denies any significant nighttime awakening. Patient can have good days and as well as have bad days. Patient states that Aleve can be beneficial. Denies any significant radiation down the leg but can have soreness in her quadricep with a lot of walking. Patient rates the pain as 6 out of 10 in severity.     Past medical history, social, surgical and family history all reviewed in electronic medical record.   Review of Systems: No headache, visual changes, nausea, vomiting, diarrhea, constipation, dizziness, abdominal pain, skin rash, fevers, chills, night sweats, weight loss, swollen lymph nodes, body aches, joint swelling, muscle aches, chest pain, shortness of breath, mood changes.   Objective Blood pressure 140/88, pulse 102, height 5' (1.524 m), weight 155 lb (70.308 kg), SpO2 99 %.  General: No apparent distress alert and oriented x3 mood and affect normal, dressed appropriately.  HEENT: Pupils equal, extraocular movements intact  Respiratory: Patient's speak in full sentences and does not appear short of breath  Cardiovascular: No lower extremity edema, non tender, no erythema  Skin: Warm dry intact with no signs of infection or rash on extremities or on axial skeleton.  Abdomen: Soft nontender  Neuro: Cranial nerves II through XII are intact, neurovascularly intact in all extremities with 2+ DTRs and 2+ pulses.  Lymph: No lymphadenopathy of posterior or anterior cervical chain or axillae bilaterally.    Gait normal with good balance and coordination.  MSK:  Non tender with full range of motion and good stability and symmetric strength and tone of shoulders, elbows, wrist,  knee and ankles bilaterally.  Hip: Right ROM IR: 15 Deg with pain, ER: 45 Deg, Flexion: 120 Deg, Extension: 100 Deg, Abduction: 45 Deg, Adduction: 45 Deg Strength IR: 5/5, ER: 5/5, Flexion: 5/5, Extension: 5/5, Abduction: 4/5, Adduction: 4/5 Pelvic alignment unremarkable to inspection and palpation. Standing hip rotation and gait without trendelenburg sign / unsteadiness. Greater trochanter without tenderness to palpation. No tenderness over piriformis and greater trochanter. + FADIR No SI joint tenderness and normal minimal SI movement.   Impression and Recommendations:     This case required medical decision making of moderate complexity.

## 2014-02-18 NOTE — Patient Instructions (Signed)
Very nice to meet you Ice 20 minutes 2 times daily. Usually after activity and before bed. Exercises 3 times a week.  Take tylenol 650 mg three times a day is the best evidence based medicine we have for arthritis.  Glucosamine sulfate 750mg  twice a day is a supplement that has been shown to help moderate to severe arthritis. Vitamin D 2000 IU daily Fish oil 2 grams daily.  Tumeric 500mg  twice daily.  Capsaicin topically up to four times a day may also help with pain.  It's important that you continue to stay active. Controlling your weight is important.  Consider physical therapy to strengthen muscles around the joint that hurts to take pressure off of the joint itself. Shoe inserts with good arch support may be helpful.  [employer] orthotics at [employer] could help.  Water aerobics and cycling with low resistance are the best two types of exercise for arthritis. Come back and see me in 3-5 weeks.

## 2014-03-18 ENCOUNTER — Encounter: Payer: Self-pay | Admitting: Family Medicine

## 2014-03-18 ENCOUNTER — Ambulatory Visit (INDEPENDENT_AMBULATORY_CARE_PROVIDER_SITE_OTHER): Payer: Medicare Other | Admitting: Family Medicine

## 2014-03-18 VITALS — BP 146/82 | HR 87 | Ht 60.0 in | Wt 158.0 lb

## 2014-03-18 DIAGNOSIS — M1990 Unspecified osteoarthritis, unspecified site: Secondary | ICD-10-CM

## 2014-03-18 DIAGNOSIS — M1611 Unilateral primary osteoarthritis, right hip: Secondary | ICD-10-CM

## 2014-03-18 NOTE — Assessment & Plan Note (Signed)
Patient does have severe osteophyte right is of the right knee but is doing well with conservative therapy. Patient encouraged to do the exercises on a more regular basis and continue with the natural supplementations. Patient states that she can do all activities of daily living without any difficulty. We discussed icing regimen and what activities would be beneficial. Patient showed proper technique of certain exercises. Patient and will come back and see me again on an as-needed basis. Patient has any worsening symptoms we can always consider an intra-articular injection in the hip.  Spent greater than 25 minutes with patient face-to-face and had greater than 50% of counseling including as described above in assessment and plan.

## 2014-03-18 NOTE — Patient Instructions (Signed)
Good to see you Do my exercises 2-3 times a week Get in the gym and consider the pool Conitnue the vitamins See me when you need me.  Happy New Year!

## 2014-03-18 NOTE — Progress Notes (Signed)
  [facility] [place] [place], [place] [address] Phone: [phone] Subjective:    I'm seeing this patient by the request  of:  Renato Shin, MD   CC: Right hip pain  HWY:[id number] Alexandria Randolph is a 79 y.o. female coming in with complaint of right hip pain. Patient was seen previously for arthritis the right, patient though wanted to try conservative therapy including home exercises, icing protocol, and we discussed proper shoes. Patient states that she is a proximally 78 a 75% better. Denies any new symptoms. States that she is resting comfortably at night. Able to do activities of daily living including going up and downstairs without any pain. Patient is very happy with the results.     Past medical history, social, surgical and family history all reviewed in electronic medical record.   Review of Systems: No headache, visual changes, nausea, vomiting, diarrhea, constipation, dizziness, abdominal pain, skin rash, fevers, chills, night sweats, weight loss, swollen lymph nodes, body aches, joint swelling, muscle aches, chest pain, shortness of breath, mood changes.   Objective Blood pressure 146/82, pulse 87, height 5' (1.524 m), weight 158 lb (71.668 kg), SpO2 98 %.  General: No apparent distress alert and oriented x3 mood and affect normal, dressed appropriately.  HEENT: Pupils equal, extraocular movements intact  Respiratory: Patient's speak in full sentences and does not appear short of breath  Cardiovascular: No lower extremity edema, non tender, no erythema  Skin: Warm dry intact with no signs of infection or rash on extremities or on axial skeleton.  Abdomen: Soft nontender  Neuro: Cranial nerves II through XII are intact, neurovascularly intact in all extremities with 2+ DTRs and 2+ pulses.  Lymph: No lymphadenopathy of posterior or anterior cervical chain or axillae bilaterally.  Gait mild antalgic gait MSK:  Non tender with full range  of motion and good stability and symmetric strength and tone of shoulders, elbows, wrist,  knee and ankles bilaterally.  Hip: Right ROM IR: 15 Deg with pain but less severity from previously., ER: 45 Deg, Flexion: 120 Deg, Extension: 100 Deg, Abduction: 45 Deg, Adduction: 45 Deg Strength IR: 5/5, ER: 5/5, Flexion: 5/5, Extension: 5/5, Abduction: 4/5, Adduction: 4/5 Pelvic alignment unremarkable to inspection and palpation. Standing hip rotation and gait without trendelenburg sign / unsteadiness. Greater trochanter without tenderness to palpation. No tenderness over piriformis and greater trochanter. + FADIR but improved No SI joint tenderness and normal minimal SI movement.   Impression and Recommendations:     This case required medical decision making of moderate complexity.

## 2014-03-24 ENCOUNTER — Encounter: Payer: Self-pay | Admitting: Internal Medicine

## 2014-04-07 ENCOUNTER — Telehealth: Payer: Self-pay | Admitting: Endocrinology

## 2014-04-07 NOTE — Telephone Encounter (Signed)
Patient would like to know is it time for a colonoscopy, please advise

## 2014-04-08 NOTE — Telephone Encounter (Signed)
Lvom advising pt that I had spoke to GI. Pt advised to contact GI to schedule office visit to discuss colonoscopy. Requested call back if pt had any questions.

## 2014-08-13 ENCOUNTER — Telehealth: Payer: Self-pay | Admitting: Endocrinology

## 2014-08-13 NOTE — Telephone Encounter (Signed)
Pt is aware of her appt on 12/07/2014 with Dr. Loanne Drilling and will recheck B/P then

## 2014-09-08 DIAGNOSIS — Z1231 Encounter for screening mammogram for malignant neoplasm of breast: Secondary | ICD-10-CM | POA: Diagnosis not present

## 2014-09-08 LAB — HM MAMMOGRAPHY

## 2014-09-29 ENCOUNTER — Encounter: Payer: Self-pay | Admitting: Endocrinology

## 2014-11-01 ENCOUNTER — Encounter (HOSPITAL_COMMUNITY): Payer: Self-pay | Admitting: *Deleted

## 2014-11-01 ENCOUNTER — Emergency Department (HOSPITAL_COMMUNITY)
Admission: EM | Admit: 2014-11-01 | Discharge: 2014-11-01 | Disposition: A | Discharge status: Home / Self Care | Payer: Medicare Other | Attending: Emergency Medicine | Admitting: Emergency Medicine

## 2014-11-01 ENCOUNTER — Emergency Department (HOSPITAL_COMMUNITY): Payer: Medicare Other

## 2014-11-01 DIAGNOSIS — Z87891 Personal history of nicotine dependence: Secondary | ICD-10-CM | POA: Diagnosis not present

## 2014-11-01 DIAGNOSIS — I10 Essential (primary) hypertension: Secondary | ICD-10-CM | POA: Diagnosis not present

## 2014-11-01 DIAGNOSIS — M1990 Unspecified osteoarthritis, unspecified site: Secondary | ICD-10-CM | POA: Insufficient documentation

## 2014-11-01 DIAGNOSIS — Z7982 Long term (current) use of aspirin: Secondary | ICD-10-CM | POA: Diagnosis not present

## 2014-11-01 DIAGNOSIS — J9801 Acute bronchospasm: Secondary | ICD-10-CM | POA: Diagnosis not present

## 2014-11-01 DIAGNOSIS — J209 Acute bronchitis, unspecified: Secondary | ICD-10-CM | POA: Diagnosis not present

## 2014-11-01 DIAGNOSIS — Z79899 Other long term (current) drug therapy: Secondary | ICD-10-CM | POA: Diagnosis not present

## 2014-11-01 DIAGNOSIS — J309 Allergic rhinitis, unspecified: Secondary | ICD-10-CM | POA: Diagnosis not present

## 2014-11-01 DIAGNOSIS — Z8639 Personal history of other endocrine, nutritional and metabolic disease: Secondary | ICD-10-CM | POA: Diagnosis not present

## 2014-11-01 DIAGNOSIS — R0602 Shortness of breath: Secondary | ICD-10-CM | POA: Diagnosis not present

## 2014-11-01 DIAGNOSIS — R05 Cough: Secondary | ICD-10-CM | POA: Diagnosis not present

## 2014-11-01 LAB — BASIC METABOLIC PANEL
Anion gap: 11 (ref 5–15)
BUN: 7 mg/dL (ref 6–20)
CALCIUM: 9.5 mg/dL (ref 8.9–10.3)
CHLORIDE: 102 mmol/L (ref 101–111)
CO2: 25 mmol/L (ref 22–32)
CREATININE: 0.83 mg/dL (ref 0.44–1.00)
Glucose, Bld: 135 mg/dL — ABNORMAL HIGH (ref 65–99)
POTASSIUM: 4 mmol/L (ref 3.5–5.1)
SODIUM: 138 mmol/L (ref 135–145)

## 2014-11-01 LAB — I-STAT TROPONIN, ED: Troponin i, poc: 0 ng/mL (ref 0.00–0.08)

## 2014-11-01 LAB — CBC
HCT: 44.7 % (ref 36.0–46.0)
HEMOGLOBIN: 15 g/dL (ref 12.0–15.0)
MCH: 30.5 pg (ref 26.0–34.0)
MCHC: 33.6 g/dL (ref 30.0–36.0)
MCV: 91 fL (ref 78.0–100.0)
PLATELETS: 344 10*3/uL (ref 150–400)
RBC: 4.91 MIL/uL (ref 3.87–5.11)
RDW: 13.3 % (ref 11.5–15.5)
WBC: 4.7 10*3/uL (ref 4.0–10.5)

## 2014-11-01 MED ORDER — PREDNISONE 20 MG PO TABS
60.0000 mg | ORAL_TABLET | Freq: Once | ORAL | Status: AC
Start: 2014-11-01 — End: 2014-11-01
  Administered 2014-11-01: 60 mg via ORAL
  Filled 2014-11-01: qty 3

## 2014-11-01 MED ORDER — PREDNISONE 20 MG PO TABS: 20.0000 mg | ORAL_TABLET | Freq: Two times a day (BID) | ORAL | Status: DC

## 2014-11-01 MED ORDER — FLUTICASONE PROPIONATE 50 MCG/ACT NA SUSP: NASAL | Status: DC

## 2014-11-01 MED ORDER — ALBUTEROL SULFATE HFA 108 (90 BASE) MCG/ACT IN AERS: 1.0000 | INHALATION_SPRAY | Freq: Four times a day (QID) | RESPIRATORY_TRACT | Status: DC | PRN

## 2014-11-01 MED ORDER — ALBUTEROL SULFATE (2.5 MG/3ML) 0.083% IN NEBU
2.5000 mg | INHALATION_SOLUTION | Freq: Once | RESPIRATORY_TRACT | Status: AC
  Administered 2014-11-01: 2.5 mg via RESPIRATORY_TRACT
  Filled 2014-11-01: qty 3

## 2014-11-01 MED ORDER — CETIRIZINE HCL 10 MG PO TABS: 10.0000 mg | ORAL_TABLET | Freq: Every day | ORAL | Status: DC

## 2014-11-01 NOTE — ED Notes (Signed)
Pt reports having cough and cold symptoms for over a week, having sob. Denies cp, fever or swelling. Airway intact and ekg done at triage.

## 2014-11-01 NOTE — ED Provider Notes (Signed)
CSN: [id number]     Arrival date & time 11/01/14  1346 History   First MD Initiated Contact with Patient 11/01/14 1531     Chief Complaint  Patient presents with  . Shortness of Breath      HPI  Patient presents for evaluation of cough and difficulty breathing. Symptoms for about one week. Started as nasal congestion and "just a cold". She developed more of a cough. Particular, she coughs at night and in the morning. Mostly dry but some occasional "clear cold". No clear yellow discharge from the nose. No green or yellow sputum. No hemoptysis.  No chest neck back or jaw pain. No extremity pain or swelling.  Distress smoking. Quit over 8 years ago. No history of documented lung disease.  Past Medical History  Diagnosis Date  . DYSLIPIDEMIA 12/01/2006  . SMOKER 12/01/2006  . HYPERTENSION 12/01/2006  . OSTEOPOROSIS 12/01/2006  . HYPERGLYCEMIA 12/01/2006   Past Surgical History  Procedure Laterality Date  . Total abdominal hysterectomy w/ bilateral salpingoophorectomy  1980  . Abdominal hysterectomy     Family History  Problem Relation Age of Onset  . Cancer Mother     "Throat" and colon cancer, both at an advanced age   Social History  Substance Use Topics  . Smoking status: Former Research scientist (life sciences)  . Smokeless tobacco: None  . Alcohol Use: None   OB History    No data available     Review of Systems  Constitutional: Negative for fever, chills, diaphoresis, appetite change and fatigue.  HENT: Negative for mouth sores, sore throat and trouble swallowing.   Eyes: Negative for visual disturbance.  Respiratory: Positive for cough, shortness of breath and wheezing. Negative for chest tightness.   Cardiovascular: Negative for chest pain.  Gastrointestinal: Negative for nausea, vomiting, abdominal pain, diarrhea and abdominal distention.  Endocrine: Negative for polydipsia, polyphagia and polyuria.  Genitourinary: Negative for dysuria, frequency and hematuria.  Musculoskeletal: Negative for  gait problem.  Skin: Negative for color change, pallor and rash.  Neurological: Negative for dizziness, syncope, light-headedness and headaches.  Hematological: Does not bruise/bleed easily.  Psychiatric/Behavioral: Negative for behavioral problems and confusion.      Allergies  Losartan potassium; Olmesartan medoxomil; and Simvastatin  Home Medications   Prior to Admission medications   Medication Sig Start Date End Date Taking? Authorizing Provider  albuterol (PROVENTIL HFA;VENTOLIN HFA) 108 (90 BASE) MCG/ACT inhaler Inhale 1-2 puffs into the lungs every 6 (six) hours as needed for wheezing. 11/01/14   Tanna Furry, MD  aspirin 81 MG tablet Take 81 mg by mouth daily.      Historical Provider, MD  cetirizine (ZYRTEC) 10 MG tablet Take 1 tablet (10 mg total) by mouth daily. 1 po q day prn allergies 11/01/14   Tanna Furry, MD  fexofenadine (ALLEGRA) 180 MG tablet Take 180 mg by mouth as needed.    Historical Provider, MD  fluticasone Asencion Islam) 50 MCG/ACT nasal spray 1 spray each nares twice a day 11/01/14   Tanna Furry, MD  predniSONE (DELTASONE) 20 MG tablet Take 1 tablet (20 mg total) by mouth 2 (two) times daily with a meal. 11/01/14   Tanna Furry, MD   BP 131/77 mmHg  Pulse 83  Temp(Src) 97.9 F (36.6 C) (Oral)  Resp 19  Ht 5' (1.524 m)  Wt 149 lb 11.2 oz (67.903 kg)  BMI 29.24 kg/m2  SpO2 99% Physical Exam  Constitutional: She is oriented to person, place, and time. She appears well-developed and well-nourished. No distress.  HENT:  Head: Normocephalic.  Eyes: Conjunctivae are normal. Pupils are equal, round, and reactive to light. No scleral icterus.  Neck: Normal range of motion. Neck supple. No thyromegaly present.  Cardiovascular: Normal rate and regular rhythm.  Exam reveals no gallop and no friction rub.   No murmur heard. Pulmonary/Chest: Effort normal. No respiratory distress. She has wheezes. She has no rales.  Mild diffuse wheezing. Globally diminished breath sounds. No  increased work of breathing  Abdominal: Soft. Bowel sounds are normal. She exhibits no distension. There is no tenderness. There is no rebound.  Musculoskeletal: Normal range of motion.  Neurological: She is alert and oriented to person, place, and time.  Skin: Skin is warm and dry. No rash noted.  Psychiatric: She has a normal mood and affect. Her behavior is normal.    ED Course  Procedures (including critical care time) Labs Review Labs Reviewed  BASIC METABOLIC PANEL - Abnormal; Notable for the following:    Glucose, Bld 135 (*)    All other components within normal limits  CBC  I-STAT TROPOININ, ED    Imaging Review Dg Chest 2 View  11/01/2014   CLINICAL DATA:  80 year old female with history of cough and cold like symptoms for 1 week. Shortness of breath.  EXAM: CHEST  2 VIEW  COMPARISON:  No priors.  FINDINGS: Lung volumes are normal. No consolidative airspace disease. No pleural effusions. No pneumothorax. No pulmonary nodule or mass noted. Pulmonary vasculature and the cardiomediastinal silhouette are within normal limits. Atherosclerosis in the thoracic aorta.  IMPRESSION: 1.  No radiographic evidence of acute cardiopulmonary disease. 2. Atherosclerosis.   Electronically Signed   By: Vinnie Langton M.D.   On: 11/01/2014 14:48   I, Jeneen Rinks, Liela Rylee JOSEPH, personally reviewed and evaluated these images and lab results as part of my medical decision-making.   EKG Interpretation None      MDM   Final diagnoses:  Bronchospasm with bronchitis, acute  Allergic rhinitis, unspecified allergic rhinitis type  Bronchospasm    Patient given albuterol neb here. Lungs clear. Remains well oxygenated. Not tachycardic. No acute findings on x-ray. Plan is home, albuterol MDI, prednisone 5 day course. Regarding her ongoing nasal congestion and rhinorrhea plan will be Zyrtec and Flonase. Primary care follow-up. ER with any worsening symptoms.    Tanna Furry, MD 11/01/14 [phone]

## 2014-11-01 NOTE — Discharge Instructions (Signed)

## 2014-11-12 ENCOUNTER — Encounter: Payer: Self-pay | Admitting: Internal Medicine

## 2014-11-18 ENCOUNTER — Telehealth: Payer: Self-pay

## 2014-11-18 NOTE — Telephone Encounter (Signed)
Pt will have B/P rechecked on upcoming f/u visit 12/07/2014

## 2014-12-07 ENCOUNTER — Encounter: Payer: Self-pay | Admitting: Endocrinology

## 2014-12-07 ENCOUNTER — Ambulatory Visit (INDEPENDENT_AMBULATORY_CARE_PROVIDER_SITE_OTHER): Payer: Medicare Other | Admitting: Endocrinology

## 2014-12-07 VITALS — BP 140/86 | HR 105 | Temp 98.2°F | Ht 60.0 in | Wt 146.0 lb

## 2014-12-07 DIAGNOSIS — Z0189 Encounter for other specified special examinations: Secondary | ICD-10-CM

## 2014-12-07 DIAGNOSIS — R7309 Other abnormal glucose: Secondary | ICD-10-CM | POA: Diagnosis not present

## 2014-12-07 DIAGNOSIS — R739 Hyperglycemia, unspecified: Secondary | ICD-10-CM | POA: Diagnosis not present

## 2014-12-07 DIAGNOSIS — M810 Age-related osteoporosis without current pathological fracture: Secondary | ICD-10-CM | POA: Diagnosis not present

## 2014-12-07 DIAGNOSIS — J45901 Unspecified asthma with (acute) exacerbation: Secondary | ICD-10-CM

## 2014-12-07 DIAGNOSIS — I10 Essential (primary) hypertension: Secondary | ICD-10-CM | POA: Diagnosis not present

## 2014-12-07 DIAGNOSIS — E559 Vitamin D deficiency, unspecified: Secondary | ICD-10-CM

## 2014-12-07 DIAGNOSIS — R030 Elevated blood-pressure reading, without diagnosis of hypertension: Secondary | ICD-10-CM | POA: Diagnosis not present

## 2014-12-07 DIAGNOSIS — E785 Hyperlipidemia, unspecified: Secondary | ICD-10-CM | POA: Diagnosis not present

## 2014-12-07 DIAGNOSIS — Z0000 Encounter for general adult medical examination without abnormal findings: Secondary | ICD-10-CM

## 2014-12-07 LAB — VITAMIN D 25 HYDROXY (VIT D DEFICIENCY, FRACTURES): VITD: 24.72 ng/mL — ABNORMAL LOW (ref 30.00–100.00)

## 2014-12-07 LAB — HEMOGLOBIN A1C: HEMOGLOBIN A1C: 5.8 % (ref 4.6–6.5)

## 2014-12-07 LAB — URINALYSIS, ROUTINE W REFLEX MICROSCOPIC
BILIRUBIN URINE: NEGATIVE
Hgb urine dipstick: NEGATIVE
KETONES UR: NEGATIVE
Nitrite: NEGATIVE
PH: 6 (ref 5.0–8.0)
RBC / HPF: NONE SEEN (ref 0–?)
SPECIFIC GRAVITY, URINE: 1.015 (ref 1.000–1.030)
Total Protein, Urine: NEGATIVE
URINE GLUCOSE: NEGATIVE
UROBILINOGEN UA: 0.2 (ref 0.0–1.0)

## 2014-12-07 LAB — LIPID PANEL
CHOL/HDL RATIO: 3
Cholesterol: 225 mg/dL — ABNORMAL HIGH (ref 0–200)
HDL: 65.6 mg/dL (ref >39.00)
LDL CALC: 135 mg/dL — AB (ref 0–99)
NonHDL: 159.62
Triglycerides: 121 mg/dL (ref 0.0–149.0)
VLDL: 24.2 mg/dL (ref 0.0–40.0)

## 2014-12-07 LAB — TSH: TSH: 1.91 u[IU]/mL (ref 0.35–4.50)

## 2014-12-07 MED ORDER — FLUTICASONE-SALMETEROL 100-50 MCG/DOSE IN AEPB: 1.0000 | INHALATION_SPRAY | Freq: Two times a day (BID) | RESPIRATORY_TRACT | Status: DC

## 2014-12-07 MED ORDER — METHYLPREDNISOLONE 4 MG PO TBPK: ORAL_TABLET | ORAL | Status: DC

## 2014-12-07 NOTE — Patient Instructions (Addendum)
i have sent 2 prescriptions to your pharmacy: for an additional inhaler, and a steroid "pack." In addition, don't hesitate to use your as-needed inhaler.   Please see a lung specialist.  you will receive a phone call, about a day and time for an appointment.   blood tests are requested for you today.  We'll let you know about the results.  good diet and exercise significantly improve your health.  please let me know if you wish to be referred to a dietician.  high blood sugar is very risky to your health.  you should see an eye doctor and dentist every year.  It is very important to get all recommended vaccinations.  please consider these measures for your health:  minimize alcohol.  do not use tobacco products.  have a colonoscopy at least every 10 years from age 89.  Women should have an annual mammogram from age 61.  keep firearms safely stored.  always use seat belts.  have working smoke alarms in your home.  see an eye doctor and dentist regularly.  never drive under the influence of alcohol or drugs (including prescription drugs).   it is critically important to prevent falling down (keep floor areas well-lit, dry, and free of loose objects.  If you have a cane, walker, or wheelchair, you should use it, even for short trips around the house.  Also, try not to rush).

## 2014-12-07 NOTE — Progress Notes (Signed)
Subjective:    Patient ID: Alexandria Randolph, female    DOB: May 11, 1934, 80 y.o.   MRN: [id number]  HPI The state of at least three ongoing medical problems is addressed today, with interval history of each noted here: Pt was seen in ER 1 month ago for moderate wheezing in the chest, and assoc doe.  She is improved, but sxs are not resolved. Denies chest pain. She has lost 15 lbs x 1 year.   Past Medical History  Diagnosis Date  . DYSLIPIDEMIA 12/01/2006  . SMOKER 12/01/2006  . HYPERTENSION 12/01/2006  . OSTEOPOROSIS 12/01/2006  . HYPERGLYCEMIA 12/01/2006    Past Surgical History  Procedure Laterality Date  . Total abdominal hysterectomy w/ bilateral salpingoophorectomy  1980  . Abdominal hysterectomy      Social History   Social History  . Marital Status: Single    Spouse Name: N/A  . Number of Children: N/A  . Years of Education: N/A   Occupational History  . Retired    Social History Main Topics  . Smoking status: Former Research scientist (life sciences)  . Smokeless tobacco: Not on file  . Alcohol Use: Not on file  . Drug Use: Not on file  . Sexual Activity: Not on file   Other Topics Concern  . Not on file   Social History Narrative   Married    Current Outpatient Prescriptions on File Prior to Visit  Medication Sig Dispense Refill  . albuterol (PROVENTIL HFA;VENTOLIN HFA) 108 (90 BASE) MCG/ACT inhaler Inhale 1-2 puffs into the lungs every 6 (six) hours as needed for wheezing. 1 Inhaler 0  . aspirin 81 MG tablet Take 81 mg by mouth daily.      . cetirizine (ZYRTEC) 10 MG tablet Take 1 tablet (10 mg total) by mouth daily. 1 po q day prn allergies 30 tablet 0  . fexofenadine (ALLEGRA) 180 MG tablet Take 180 mg by mouth as needed.    . fluticasone (FLONASE) 50 MCG/ACT nasal spray 1 spray each nares twice a day 10 g 1   No current facility-administered medications on file prior to visit.    Allergies  Allergen Reactions  . Losartan Potassium     REACTION: sob  . Olmesartan Medoxomil      REACTION: sob  . Simvastatin     REACTION: Malgias    Family History  Problem Relation Age of Onset  . Cancer Mother     "Throat" and colon cancer, both at an advanced age    BP 140/86 mmHg  Pulse 105  Temp(Src) 98.2 F (36.8 C) (Oral)  Ht 5' (1.524 m)  Wt 146 lb (66.225 kg)  BMI 28.51 kg/m2  SpO2 97%   Review of Systems She has rhinorrhea and slight dry cough    Objective:   Physical Exam VITAL SIGNS:  See vs page GENERAL: no distress LUNGS:  Clear to auscultation, except for a few exp wheezes   i personally reviewed spirometry tracing (today): Indication: asthma Impression: severe asthma  Vit-D=25    Assessment & Plan:  Asthma, severe, new to me: ref pulm.  i also rx'ed medrol dosepack and advair.  Vit-d deficiency, new: non-prescription vitamin-D, 2000 units/day. HTN: ? situational.  We'll follow   Subjective:   Patient here for Medicare annual wellness visit and management of other chronic and acute problems.     Risk factors: advanced age    48 of Physicians Providing Medical Care to Patient:  See "snapshot"   Activities of Daily Living:  In your present state of health, do you have any difficulty performing the following activities?:  Preparing food and eating?: No  Bathing yourself: No  Getting dressed: No  Using the toilet:No  Moving around from place to place: No  In the past year have you fallen or had a near fall?: No    Home Safety: Has smoke detector and wears seat belts. No firearms.  Diet and Exercise  Current exercise habits: pt says limited by sob Dietary issues discussed: pt reports a healthy diet   Depression Screen  Q1: Over the past two weeks, have you felt down, depressed or hopeless? no  Q2: Over the past two weeks, have you felt little interest or pleasure in doing things? no   The following portions of the patient's history were reviewed and updated as appropriate: allergies, current medications, past family  history, past medical history, past social history, past surgical history and problem list.   Review of Systems  Denies hearing loss, and visual loss Objective:   Vision:  Advertising account executive, but does not recall name Hearing: grossly normal Body mass index:  See vs page Msk: pt easily and quickly performs "get-up-and-go" from a sitting position Cognitive Impairment Assessment: cognition, memory and judgment appear normal.  remembers 3/3 at 5 minutes.  excellent recall.  can easily read and write a sentence.  alert and oriented x 3.    Assessment:   Medicare wellness utd on preventive parameters.     Plan:   During the course of the visit the patient was educated and counseled about appropriate screening and preventive services including:        Fall prevention   Screening mammography  Bone densitometry screening  Diabetes screening  Nutrition counseling   Vaccines / LABS Zostavax / Pneumococcal Vaccine  today  PSA  Patient Instructions (the written plan) was given to the patient.

## 2014-12-07 NOTE — Progress Notes (Signed)
we discussed code status.  pt requests full code, but would not want to be started or maintained on artificial life-support measures if there was not a reasonable chance of recovery 

## 2014-12-08 LAB — PTH, INTACT AND CALCIUM
Calcium: 9.4 mg/dL (ref 8.4–10.5)
PTH: 60 pg/mL (ref 14–64)

## 2014-12-09 ENCOUNTER — Encounter: Payer: Self-pay | Admitting: Internal Medicine

## 2014-12-24 ENCOUNTER — Encounter: Payer: Self-pay | Admitting: Internal Medicine

## 2014-12-24 ENCOUNTER — Ambulatory Visit (INDEPENDENT_AMBULATORY_CARE_PROVIDER_SITE_OTHER): Payer: Medicare Other | Admitting: Internal Medicine

## 2014-12-24 ENCOUNTER — Telehealth: Payer: Self-pay | Admitting: Internal Medicine

## 2014-12-24 VITALS — BP 118/82 | HR 102 | Ht 60.0 in | Wt 144.4 lb

## 2014-12-24 DIAGNOSIS — J45991 Cough variant asthma: Secondary | ICD-10-CM | POA: Diagnosis not present

## 2014-12-24 MED ORDER — MOMETASONE FURO-FORMOTEROL FUM 100-5 MCG/ACT IN AERO: INHALATION_SPRAY | RESPIRATORY_TRACT | Status: DC

## 2014-12-24 MED ORDER — ALBUTEROL SULFATE HFA 108 (90 BASE) MCG/ACT IN AERS: 1.0000 | INHALATION_SPRAY | Freq: Four times a day (QID) | RESPIRATORY_TRACT | Status: DC | PRN

## 2014-12-24 NOTE — Patient Instructions (Addendum)
Stop advair and start dulera 100 Take 2 puffs first thing in am and then another 2 puffs about 12 hours later.   Work on inhaler technique:  relax and gently blow all the way out then take a nice smooth deep breath back in, triggering the inhaler at same time you start breathing in.  Hold for up to 5 seconds if you can. Blow out thru nose. Rinse and gargle with water when done      Only use your albuterol as a rescue medication to be used if you can't catch your breath by resting or doing a relaxed purse lip breathing pattern.  - The less you use it, the better it will work when you need it. - Ok to use up to 2 puffs  every 4 hours if you must but call for immediate appointment if use goes up over your usual need - Don't leave home without it !!  (think of it like the spare tire for your car)  Try prilosec otc 20mg   Take 30-60 min before first meal of the day and Pepcid ac (famotidine) 20 mg one @  bedtime   GERD (REFLUX)  is an extremely common cause of respiratory symptoms just like yours , many times with no obvious heartburn at all.    It can be treated with medication, but also with lifestyle changes including elevation of the head of your bed (ideally with 6 inch  bed blocks),  Smoking cessation, avoidance of late meals, excessive alcohol, and avoid fatty foods, chocolate, peppermint, colas, red wine, and acidic juices such as orange juice.  NO MINT OR MENTHOL PRODUCTS SO NO COUGH DROPS  USE SUGARLESS CANDY INSTEAD (Jolley ranchers or Stover's or Life Savers) or even ice chips will also do - the key is to swallow to prevent all throat clearing. NO OIL BASED VITAMINS - use powdered substitutes.    Please schedule a follow up office visit in 2 weeks, sooner if needed -  bring all meds with you including over the counter

## 2014-12-24 NOTE — Assessment & Plan Note (Addendum)
Very Unusual hx of no asthma and now asthma-like symptoms that are difficult to control with new onset at age 80.  DDX of  difficult airways management all start with A and  include Adherence, Ace Inhibitors, Acid Reflux, Active Sinus Disease, Alpha 1 Antitripsin deficiency, Anxiety masquerading as Airways dz,  ABPA,  allergy(esp in young), Aspiration (esp in elderly), Adverse effects of meds,  Active smokers, A bunch of PE's (a small clot burden can't cause this syndrome unless there is already severe underlying pulm or vascular dz with poor reserve) plus two Bs  = Bronchiectasis and Beta blocker use..and one C= CHF  Adherence is always the initial "prime suspect" and is a multilayered concern that requires a "trust but verify" approach in every patient - starting with knowing how to use medications, especially inhalers, correctly, keeping up with refills and understanding the fundamental difference between maintenance and prns vs those medications only taken for a very short course and then stopped and not refilled.  - The proper method of use, as well as anticipated side effects, of a metered-dose inhaler are discussed and demonstrated to the patient. Improved effectiveness after extensive coaching during this visit to a level of approximately  75% > try dulera 100 2bid  ? Acid (or non-acid) GERD > always difficult to exclude as up to 75% of pts in some series report no assoc GI/ Heartburn symptoms> rec max (24h)  acid suppression and diet restrictions/ reviewed and instructions given in writing.   ? Allergy > possible given assoc rhinitis but prev w/u for allergy in [place] not fruitful > consider trial of singulair if not better  ? Active sinus dz > doubt  - sinus ct if not responding to rx  ? Adverse effects of meds > advair not optimal choice for cougher > rec hfa instead    I had an extended discussion with the patient reviewing all relevant studies completed to date and  Lasting 43  m  Each maintenance medication was reviewed in detail including most importantly the difference between maintenance and prns and under what circumstances the prns are to be triggered using an action plan format that is not reflected in the computer generated alphabetically organized AVS.    Please see instructions for details which were reviewed in writing and the patient given a copy highlighting the part that I personally wrote and discussed at today's ov.

## 2014-12-24 NOTE — Progress Notes (Signed)
Subjective:    Patient ID: Alexandria Randolph, female    DOB: [birth year],  MRN: [id number]  HPI  33 yobf  Healthy as child one episode of bad bronchitis in her 76 s then quit smoking around 2002 while feeling fine while living in [place] but bothered sooner thereafter  by some kind of reaction to certain foods (not consistent) >  Lips swelling / neg allergy w/u rx with benadryl prn and continued to have some itching  sneezing / water rhinitis but never lower resp symptoms but then started with new cough early august 2016 then sob > ER 11/01/14  referred to pulmonary 12/24/2014 by Dr Loanne Drilling    12/24/2014 1st [facility] office visit/ Alexandria Randolph   Chief Complaint  Patient presents with  . PULMONARY CONSULT    SOB/cough. Pt c.o of productive cough with yellow/clear mucus. Pt c/o dyspnea with exertion. Pt denies wheeze/CP/tightness. Pt is currentltly our of her albuterol inhaler.  while in still in [place] developed itching sneezing running nose intermittent not seasonal and worse when got to [place] Then New cough prod clear and breathing started in early August 2016 "felt like a head cold"  and much better since seen in ER  11/01/14 rx with nebs/ steroids/ advair but not back to normal mostly daytime coughing and doe with more than slow adls  No obvious other patterns in day to day or daytime variabilty or assoc  cp or chest tightness, subjective wheeze or overt  hb symptoms. No unusual exp hx or h/o childhood pna/ asthma or knowledge of premature birth.  Sleeping ok without nocturnal  or early am exacerbation  of respiratory  c/o's or need for noct saba. Also denies any obvious fluctuation of symptoms with weather or environmental changes or other aggravating or alleviating factors except as outlined above   Current Medications, Allergies, Complete Past Medical History, Past Surgical History, Family History, and Social History were reviewed in [employer] record.              Review of Systems  Constitutional: Negative.  Negative for fever, chills and unexpected weight change.  HENT: Positive for congestion and sneezing. Negative for dental problem, ear pain, nosebleeds, postnasal drip, rhinorrhea, sinus pressure, sore throat, trouble swallowing and voice change.   Eyes: Negative.  Negative for visual disturbance.  Respiratory: Positive for cough and shortness of breath. Negative for choking.   Cardiovascular: Negative.  Negative for chest pain and leg swelling.  Gastrointestinal: Negative.  Negative for vomiting, abdominal pain and diarrhea.  Endocrine: Negative.   Genitourinary: Negative.  Negative for difficulty urinating.  Musculoskeletal: Negative.  Negative for arthralgias.  Skin: Negative.  Negative for rash.  Allergic/Immunologic: Negative.   Neurological: Negative.  Negative for tremors, syncope and headaches.  Hematological: Negative.  Does not bruise/bleed easily.  Psychiatric/Behavioral: Negative.        Objective:   Physical Exam  amb hoarse bf nad  Wt Readings from Last 3 Encounters:  12/24/14 144 lb 6.4 oz (65.499 kg)  12/07/14 146 lb (66.225 kg)  11/01/14 149 lb 11.2 oz (67.903 kg)    Vital signs reviewed   HEENT: nl dentition, turbinates, and orophanx. Nl external ear canals without cough reflex   NECK :  without JVD/Nodes/TM/ nl carotid upstrokes bilaterally   LUNGS: no acc muscle use, clear to A and P bilaterally without cough on insp or exp maneuvers   CV:  RRR  no s3 or murmur or increase in P2, no  edema   ABD:  soft and nontender with nl excursion in the supine position. No bruits or organomegaly, bowel sounds nl  MS:  warm without deformities, calf tenderness, cyanosis or clubbing  SKIN: warm and dry without lesions    NEURO:  alert, approp, no deficits     I personally reviewed images and agree with radiology impression as follows:  CXR:  11/01/14 1. No radiographic evidence of acute cardiopulmonary  disease. 2. Atherosclerosis.      Assessment & Plan:

## 2014-12-24 NOTE — Telephone Encounter (Signed)
lmtcb x1 for pt. 

## 2014-12-24 NOTE — Telephone Encounter (Addendum)
Called and spoke with pt. She stated that she was seen by MW this morning and that her Proventil needed to be refilled and that she was told it was sent to the pharmacy. When she arrived at the pharmacy she was told they had not received the request from our office. I verified pharmacy was [facility]. I sent rx to pharmacy per MW's ov note. Nothing further needed   Instructions from OV on 08/24/14 with MW Stop advair and start dulera 100 Take 2 puffs first thing in am and then another 2 puffs about 12 hours later.   Work on inhaler technique: relax and gently blow all the way out then take a nice smooth deep breath back in, triggering the inhaler at same time you start breathing in. Hold for up to 5 seconds if you can. Blow out thru nose. Rinse and gargle with water when done     Only use your albuterol as a rescue medication to be used if you can't catch your breath by resting or doing a relaxed purse lip breathing pattern.  - The less you use it, the better it will work when you need it. - Ok to use up to 2 puffs every 4 hours if you must but call for immediate appointment if use goes up over your usual need - Don't leave home without it !! (think of it like the spare tire for your car)  Try prilosec otc 20mg  Take 30-60 min before first meal of the day and Pepcid ac (famotidine) 20 mg one @ bedtime   GERD (REFLUX) is an extremely common cause of respiratory symptoms just like yours , many times with no obvious heartburn at all.   It can be treated with medication, but also with lifestyle changes including elevation of the head of your bed (ideally with 6 inch bed blocks), Smoking cessation, avoidance of late meals, excessive alcohol, and avoid fatty foods, chocolate, peppermint, colas, red wine, and acidic juices such as orange juice.  NO MINT OR MENTHOL PRODUCTS SO NO COUGH DROPS  USE SUGARLESS CANDY INSTEAD (Jolley ranchers or Stover's or Life Savers)  or even ice chips will also do - the key is to swallow to prevent all throat clearing. NO OIL BASED VITAMINS - use powdered substitutes.   Please schedule a follow up office visit in 2 weeks, sooner if needed - bring all meds with you including over the counter

## 2015-01-07 ENCOUNTER — Ambulatory Visit (INDEPENDENT_AMBULATORY_CARE_PROVIDER_SITE_OTHER): Payer: Medicare Other | Admitting: Internal Medicine

## 2015-01-07 ENCOUNTER — Encounter: Payer: Self-pay | Admitting: Internal Medicine

## 2015-01-07 VITALS — BP 116/80 | HR 99 | Ht 59.0 in | Wt 144.2 lb

## 2015-01-07 DIAGNOSIS — J45991 Cough variant asthma: Secondary | ICD-10-CM | POA: Diagnosis not present

## 2015-01-07 DIAGNOSIS — Z23 Encounter for immunization: Secondary | ICD-10-CM | POA: Diagnosis not present

## 2015-01-07 MED ORDER — MOMETASONE FURO-FORMOTEROL FUM 100-5 MCG/ACT IN AERO: INHALATION_SPRAY | RESPIRATORY_TRACT | Status: DC

## 2015-01-07 NOTE — Patient Instructions (Signed)
Ok to try dulera 100 up to 2 puffs every 12 hours but ok to leave off doses if doing great  Work on inhaler technique:  relax and gently blow all the way out then take a nice smooth deep breath back in, triggering the inhaler at same time you start breathing in.  Hold for up to 5 seconds if you can. Blow out thru nose. Rinse and gargle with water when done  Please schedule a follow up office visit in 6 weeks, call sooner if needed for pfts (no inhalers that day)

## 2015-01-07 NOTE — Progress Notes (Signed)
Subjective:    Patient ID: Alexandria Randolph, female    DOB: [birth year],  MRN: [id number]    Brief patient profile: 67 yobf  Healthy as child one episode of bad bronchitis in her 69 s then quit smoking around 2002 while feeling fine while living in [place] but bothered sooner thereafter  by some kind of reaction to certain foods (not consistent) >  Lips swelling / neg allergy w/u rx with benadryl prn and continued to have some itching  sneezing / water rhinitis but never lower resp symptoms but then started with new cough early august 2016 then sob > ER 11/01/14  referred to pulmonary 12/24/2014 by Dr Loanne Drilling    History of Present Illness  12/24/2014 1st [facility] office visit/ Alexandria Randolph   Chief Complaint  Patient presents with  . PULMONARY CONSULT    SOB/cough. Pt c.o of productive cough with yellow/clear mucus. Pt c/o dyspnea with exertion. Pt denies wheeze/CP/tightness. Pt is currentltly our of her albuterol inhaler.  while in still in [place] developed itching sneezing running nose intermittent not seasonal and worse when got to [place] Then New cough prod clear and breathing started in early August 2016 "felt like a head cold"  and much better since seen in ER  11/01/14 rx with nebs/ steroids/ advair but not back to normal mostly daytime coughing and doe with more than slow adls rec Stop advair and start dulera 100 Take 2 puffs first thing in am and then another 2 puffs about 12 hours later.  Work on inhaler technique:   Only use your albuterol as a rescue medication   Try prilosec otc 20mg   Take 30-60 min before first meal of the day and Pepcid ac (famotidine) 20 mg one @  bedtime  GERD (REFLUX)  Diet  Please schedule a follow up office visit in 2 weeks, sooner if needed -  bring all meds with you including over the counter   01/07/2015  f/u ov/Alexandria Randolph re: cough variant asthma Chief Complaint  Patient presents with  . Follow-up    Pt states cough and SOB are much improved. She is  still using albuterol daily- at least 2 x. She c/o itchy eyes after uses inhalers.   much less albuterol since started dulera 100 and stopped advair   Not limited by breathing from desired activities    No obvious day to day or daytime variability or assoc chronic cough or cp or chest tightness, subjective wheeze or overt sinus or hb symptoms. No unusual exp hx or h/o childhood pna/ asthma or knowledge of premature birth.  Sleeping ok without nocturnal  or early am exacerbation  of respiratory  c/o's or need for noct saba. Also denies any obvious fluctuation of symptoms with weather or environmental changes or other aggravating or alleviating factors except as outlined above   Current Medications, Allergies, Complete Past Medical History, Past Surgical History, Family History, and Social History were reviewed in [employer] record.  ROS  The following are not active complaints unless bolded sore throat, dysphagia, dental problems, itching, sneezing,  nasal congestion or excess/ purulent secretions, ear ache,   fever, chills, sweats, unintended wt loss, classically pleuritic or exertional cp, hemoptysis,  orthopnea pnd or leg swelling, presyncope, palpitations, abdominal pain, anorexia, nausea, vomiting, diarrhea  or change in bowel or bladder habits, change in stools or urine, dysuria,hematuria,  rash, arthralgias, visual complaints, headache, numbness, weakness or ataxia or problems with walking or coordination,  change in mood/affect  or memory.           Objective:   Physical Exam  amb  bf nad   .01/07/2015        144  Wt Readings from Last 3 Encounters:  12/24/14 144 lb 6.4 oz (65.499 kg)  12/07/14 146 lb (66.225 kg)  11/01/14 149 lb 11.2 oz (67.903 kg)    Vital signs reviewed   HEENT: nl dentition, turbinates, and orophanx. Nl external ear canals without cough reflex   NECK :  without JVD/Nodes/TM/ nl carotid upstrokes bilaterally   LUNGS: no acc muscle  use, clear to A and P bilaterally without cough on insp or exp maneuvers   CV:  RRR  no s3 or murmur or increase in P2, no edema   ABD:  soft and nontender with nl excursion in the supine position. No bruits or organomegaly, bowel sounds nl  MS:  warm without deformities, calf tenderness, cyanosis or clubbing  SKIN: warm and dry without lesions    NEURO:  alert, approp, no deficits     I personally reviewed images and agree with radiology impression as follows:  CXR:  11/01/14 1. No radiographic evidence of acute cardiopulmonary disease. 2. Atherosclerosis.      Assessment & Plan:

## 2015-01-10 ENCOUNTER — Encounter: Payer: Self-pay | Admitting: Internal Medicine

## 2015-01-10 NOTE — Assessment & Plan Note (Addendum)
The proper method of use, as well as anticipated side effects, of a metered-dose inhaler are discussed and demonstrated to the patient. Improved effectiveness after extensive coaching during this visit to a level of approximately  75% from a baseline of 50% so really wasn't getting the full benefit from [place] and yet is much better.  In fact, she is so much better at this point it calls into  the question whether the problem with  the cough was an adverse effect from Advair rather than a benefit from [place]. Since dulera  will start working 5 minutes after it's taken, it's fine with me if she tries off of it to see what if anything she notices in terms of a  difference. Certainly if she is worse she can start back on dulera 100 mmediately.  I asked her return for PFTs and she has a remote smoker  I had an extended discussion with the patient reviewing all relevant studies completed to date and  lasting 15 to 20 minutes of a 25 minute visit    Each maintenance medication was reviewed in detail including most importantly the difference between maintenance and prns and under what circumstances the prns are to be triggered using an action plan format that is not reflected in the computer generated alphabetically organized AVS.    Please see instructions for details which were reviewed in writing and the patient given a copy highlighting the part that I personally wrote and discussed at today's ov.

## 2015-02-03 ENCOUNTER — Ambulatory Visit (AMBULATORY_SURGERY_CENTER): Payer: Self-pay

## 2015-02-03 VITALS — Ht 59.0 in | Wt 145.2 lb

## 2015-02-03 DIAGNOSIS — Z860100 Personal history of colon polyps, unspecified: Secondary | ICD-10-CM

## 2015-02-03 MED ORDER — SUPREP BOWEL PREP KIT 17.5-3.13-1.6 GM/177ML PO SOLN: 1.0000 | Freq: Once | ORAL | Status: DC

## 2015-02-03 NOTE — Progress Notes (Signed)
Reacted to boiled egg 20+ yrs ago, mouth swelling, has had since then without problems No diet/weight loss meds No home oxygen No past problems with anesthesia  No internet; refused emmi

## 2015-02-16 ENCOUNTER — Ambulatory Visit (AMBULATORY_SURGERY_CENTER): Payer: Medicare Other | Admitting: Internal Medicine

## 2015-02-16 ENCOUNTER — Encounter: Payer: Self-pay | Admitting: Internal Medicine

## 2015-02-16 VITALS — BP 141/93 | HR 81 | Temp 98.0°F | Resp 24 | Ht 59.0 in | Wt 145.0 lb

## 2015-02-16 DIAGNOSIS — D123 Benign neoplasm of transverse colon: Secondary | ICD-10-CM

## 2015-02-16 DIAGNOSIS — Z86010 Personal history of colonic polyps: Secondary | ICD-10-CM | POA: Diagnosis present

## 2015-02-16 DIAGNOSIS — D122 Benign neoplasm of ascending colon: Secondary | ICD-10-CM | POA: Diagnosis not present

## 2015-02-16 DIAGNOSIS — D120 Benign neoplasm of cecum: Secondary | ICD-10-CM | POA: Diagnosis not present

## 2015-02-16 DIAGNOSIS — I10 Essential (primary) hypertension: Secondary | ICD-10-CM | POA: Diagnosis not present

## 2015-02-16 DIAGNOSIS — J45909 Unspecified asthma, uncomplicated: Secondary | ICD-10-CM | POA: Diagnosis not present

## 2015-02-16 DIAGNOSIS — D126 Benign neoplasm of colon, unspecified: Secondary | ICD-10-CM | POA: Diagnosis not present

## 2015-02-16 MED ORDER — SODIUM CHLORIDE 0.9 % IV SOLN: 500.0000 mL | INTRAVENOUS | Status: DC

## 2015-02-16 NOTE — Op Note (Signed)
[facility]  [address] [place], [address]   COLONOSCOPY PROCEDURE REPORT  PATIENT: Alexandria Randolph, Alexandria Randolph  MR#: [id number] BIRTHDATE: 08-03-1934 , 80  yrs. old GENDER: female ENDOSCOPIST: Jerene Bears, MD REFERRED [id number] R Patterson, M.D. PROCEDURE DATE:  02/16/2015 PROCEDURE:   Colonoscopy, surveillance , Colonoscopy with cold biopsy polypectomy, and Colonoscopy with snare polypectomy First Screening Colonoscopy - Avg.  risk and is 50 yrs.  old or older - No.  Prior Negative Screening - Now for repeat screening. N/A  History of Adenoma - Now for follow-up colonoscopy & has been > or = to 3 yrs.  Yes hx of adenoma.  Has been 3 or more years since last colonoscopy.  Polyps removed today? Yes ASA CLASS:   Class III INDICATIONS:Surveillance due to prior colonic neoplasia and PH Colon Adenoma, last colonoscopy 2011 by Dr. Sharlett Iles. MEDICATIONS: Monitored anesthesia care and Propofol 500 mg IV  DESCRIPTION OF PROCEDURE:   After the risks benefits and alternatives of the procedure were thoroughly explained, informed consent was obtained.  The digital rectal exam revealed no rectal mass.   The LB [id number]-H190 [id number]  endoscope was introduced through the anus and advanced to the cecum, which was identified by both the appendix and ileocecal valve. No adverse events experienced. The quality of the prep was good.  (Suprep was used)  The instrument was then slowly withdrawn as the colon was fully examined. Estimated blood loss is zero unless otherwise noted in this procedure report.   COLON FINDINGS: A sessile polyp measuring 10 mm in size was found at the cecum.  A polypectomy was performed using snare cautery.  The resection was complete, the polyp tissue was completely retrieved and sent to histology.   A sessile polyp measuring 4 mm in size was found at the ileocecal valve.  A polypectomy was performed with cold forceps.  The resection was complete, the polyp tissue  was completely retrieved and sent to histology.   Five sessile polyps ranging between 3-38mm in size were found in the ascending colon (2) and transverse colon (3).  Polypectomies were performed with cold forceps (2) and with a cold snare (3).  The resection was complete, the polyp tissue was completely retrieved and sent to histology. A sessile polyp measuring 10 mm in size was found at the splenic flexure.  A polypectomy was performed using snare cautery.  The resection was complete, the polyp tissue was completely retrieved and sent to histology.   There was mild diverticulosis noted in the sigmoid colon.  Retroflexed views revealed internal hemorrhoids. The time to cecum = 6.5 Withdrawal time = 25.8   The scope was withdrawn and the procedure completed. COMPLICATIONS: There were no immediate complications.  ENDOSCOPIC IMPRESSION: 1.   Sessile polyp was found at the cecum; polypectomy was performed using snare cautery 2.   Sessile polyp was found at the ileocecal valve; polypectomy was performed with cold forceps 3.   Five sessile polyps ranging between 3-28mm in size were found in the ascending colon and transverse colon; polypectomies were performed with cold forceps and with a cold snare 4.   Sessile polyp was found at the splenic flexure; polypectomy was performed using snare cautery 5.   Mild diverticulosis was noted in the sigmoid colon  RECOMMENDATIONS: 1.  Avoid all NSAIDs and additional aspirin for the next 2 weeks. 2.  Await pathology results 3.  High fiber diet 4.  Consider repeat colonoscopy in 3 years based on overall health  at that time (office visit 1st). 5.  You will receive a letter within 1-2 weeks with the results of your biopsy as well as final recommendations.  Please call my office if you have not received a letter after 3 weeks.  eSigned:  Jerene Bears, MD 02/16/2015 2:23 PM   cc: Donavan Foil, MD and The Patient   PATIENT NAME:  Alexandria Randolph, Alexandria Randolph MR#: [id number]

## 2015-02-16 NOTE — Patient Instructions (Signed)
Discharge instructions given. Handouts on polyps and diverticulosis. Avoid all NSAIDS and additional aspirin for the next 2 weeks. Resume previous medications. YOU HAD AN ENDOSCOPIC PROCEDURE TODAY AT [facility]:   Refer to the procedure report that was given to you for any specific questions about what was found during the examination.  If the procedure report does not answer your questions, please call your gastroenterologist to clarify.  If you requested that your care partner not be given the details of your procedure findings, then the procedure report has been included in a sealed envelope for you to review at your convenience later.  YOU SHOULD EXPECT: Some feelings of bloating in the abdomen. Passage of more gas than usual.  Walking can help get rid of the air that was put into your GI tract during the procedure and reduce the bloating. If you had a lower endoscopy (such as a colonoscopy or flexible sigmoidoscopy) you may notice spotting of blood in your stool or on the toilet paper. If you underwent a bowel prep for your procedure, you may not have a normal bowel movement for a few days.  Please Note:  You might notice some irritation and congestion in your nose or some drainage.  This is from the oxygen used during your procedure.  There is no need for concern and it should clear up in a day or so.  SYMPTOMS TO REPORT IMMEDIATELY:   Following lower endoscopy (colonoscopy or flexible sigmoidoscopy):  Excessive amounts of blood in the stool  Significant tenderness or worsening of abdominal pains  Swelling of the abdomen that is new, acute  Fever of 100F or higher   For urgent or emergent issues, a gastroenterologist can be reached at any hour by calling [phone].   DIET: Your first meal following the procedure should be a small meal and then it is ok to progress to your normal diet. Heavy or fried foods are harder to digest and may make you feel nauseous or  bloated.  Likewise, meals heavy in dairy and vegetables can increase bloating.  Drink plenty of fluids but you should avoid alcoholic beverages for 24 hours.  ACTIVITY:  You should plan to take it easy for the rest of today and you should NOT DRIVE or use heavy machinery until tomorrow (because of the sedation medicines used during the test).    FOLLOW UP: Our staff will call the number listed on your records the next business day following your procedure to check on you and address any questions or concerns that you may have regarding the information given to you following your procedure. If we do not reach you, we will leave a message.  However, if you are feeling well and you are not experiencing any problems, there is no need to return our call.  We will assume that you have returned to your regular daily activities without incident.  If any biopsies were taken you will be contacted by phone or by letter within the next 1-3 weeks.  Please call us at [phone] if you have not heard about the biopsies in 3 weeks.    SIGNATURES/CONFIDENTIALITY: You and/or your care partner have signed paperwork which will be entered into your electronic medical record.  These signatures attest to the fact that that the information above on your After Visit Summary has been reviewed and is understood.  Full responsibility of the confidentiality of this discharge information lies with you and/or your care-partner.

## 2015-02-16 NOTE — Progress Notes (Signed)
Called to room to assist during endoscopic procedure.  Patient ID and intended procedure confirmed with present staff. Received instructions for my participation in the procedure from the performing physician.  

## 2015-02-16 NOTE — Progress Notes (Signed)
To recovery, report to McCoy, RN, VSS 

## 2015-02-17 ENCOUNTER — Telehealth: Payer: Self-pay | Admitting: *Deleted

## 2015-02-17 NOTE — Telephone Encounter (Signed)
  Follow up Call-  Call back number 02/16/2015  Post procedure Call Back phone  # [phone]  Permission to leave phone message Yes     Patient questions:  Do you have a fever, pain , or abdominal swelling? No. Pain Score  0 *  Have you tolerated food without any problems? Yes.    Have you been able to return to your normal activities? Yes.    Do you have any questions about your discharge instructions: Diet   No. Medications  No. Follow up visit  No.  Do you have questions or concerns about your Care? No.  Actions: * If pain score is 4 or above: No action needed, pain <4.

## 2015-02-22 ENCOUNTER — Ambulatory Visit (INDEPENDENT_AMBULATORY_CARE_PROVIDER_SITE_OTHER): Payer: Medicare Other | Admitting: Internal Medicine

## 2015-02-22 ENCOUNTER — Encounter: Payer: Self-pay | Admitting: Internal Medicine

## 2015-02-22 VITALS — BP 130/82 | HR 92 | Ht 59.0 in | Wt 146.0 lb

## 2015-02-22 DIAGNOSIS — J45991 Cough variant asthma: Secondary | ICD-10-CM

## 2015-02-22 DIAGNOSIS — J310 Chronic rhinitis: Secondary | ICD-10-CM | POA: Diagnosis not present

## 2015-02-22 LAB — PULMONARY FUNCTION TEST
DL/VA % pred: 95 %
DL/VA: 3.9 ml/min/mmHg/L
DLCO UNC % PRED: 69 %
DLCO UNC: 12.22 ml/min/mmHg
FEF 25-75 POST: 1.2 L/s
FEF 25-75 PRE: 0.5 L/s
FEF2575-%Change-Post: 138 %
FEF2575-%PRED-POST: 117 %
FEF2575-%Pred-Pre: 49 %
FEV1-%Change-Post: 34 %
FEV1-%Pred-Post: 117 %
FEV1-%Pred-Pre: 87 %
FEV1-POST: 1.37 L
FEV1-Pre: 1.02 L
FEV1FVC-%Change-Post: 10 %
FEV1FVC-%PRED-PRE: 78 %
FEV6-%CHANGE-POST: 22 %
FEV6-%PRED-POST: 144 %
FEV6-%Pred-Pre: 117 %
FEV6-Post: 2.09 L
FEV6-Pre: 1.7 L
FEV6FVC-%CHANGE-POST: 0 %
FEV6FVC-%PRED-PRE: 105 %
FEV6FVC-%Pred-Post: 106 %
FVC-%Change-Post: 22 %
FVC-%PRED-POST: 136 %
FVC-%Pred-Pre: 112 %
FVC-Post: 2.09 L
FVC-Pre: 1.71 L
POST FEV1/FVC RATIO: 66 %
PRE FEV1/FVC RATIO: 60 %
Post FEV6/FVC ratio: 100 %
Pre FEV6/FVC Ratio: 99 %
RV % PRED: 120 %
RV: 2.53 L
TLC % pred: 97 %
TLC: 4.19 L

## 2015-02-22 NOTE — Progress Notes (Signed)
PFT done today. 

## 2015-02-22 NOTE — Assessment & Plan Note (Signed)
Some better on allergra so should try zyrtec next

## 2015-02-22 NOTE — Patient Instructions (Signed)
Try zyrtec 10 mg at bedtime as needed for drippy nose   Continue dulera 100 Take 2 puffs first thing in am and then another 2 puffs about 12 hours later.    Only use your albuterol as a rescue medication to be used if you can't catch your breath by resting or doing a relaxed purse lip breathing pattern.  - The less you use it, the better it will work when you need it. - Ok to use up to 2 puffs  every 4 hours if you must but call for immediate appointment if use goes up over your usual need - Don't leave home without it !!  (think of it like the spare tire for your car)   Please schedule a follow up visit in 3 months but call sooner if needed

## 2015-02-22 NOTE — Progress Notes (Signed)
Subjective:    Patient ID: Alexandria Randolph, female    DOB: Nov 07, 1934,  MRN: [id number]    Brief patient profile: 20 yobf  Healthy as child one episode of bad bronchitis in her 46 s then quit smoking around 2002 while feeling fine while living in [place] but bothered sooner thereafter  by some kind of reaction to certain foods (not consistent) >  Lips swelling / neg allergy w/u rx with benadryl prn and continued to have some itching  sneezing / water rhinitis but never lower resp symptoms but then started with new cough early august 2016 then sob > ER 11/01/14  referred to pulmonary 12/24/2014 by Dr Loanne Drilling    History of Present Illness  12/24/2014 1st [facility] office visit/ Alexandria Randolph   Chief Complaint  Patient presents with  . PULMONARY CONSULT    SOB/cough. Pt c.o of productive cough with yellow/clear mucus. Pt c/o dyspnea with exertion. Pt denies wheeze/CP/tightness. Pt is currentltly our of her albuterol inhaler.  while in still in [place] developed itching sneezing running nose intermittent not seasonal and worse when got to [place] Then New cough prod clear and breathing started in early August 2016 "felt like a head cold"  and much better since seen in ER  11/01/14 rx with nebs/ steroids/ advair but not back to normal mostly daytime coughing and doe with more than slow adls rec Stop advair and start dulera 100 Take 2 puffs first thing in am and then another 2 puffs about 12 hours later.  Work on inhaler technique:   Only use your albuterol as a rescue medication   Try prilosec otc 20mg   Take 30-60 min before first meal of the day and Pepcid ac (famotidine) 20 mg one @  bedtime  GERD (REFLUX)  Diet      01/07/2015  f/u ov/Alexandria Randolph re: cough variant asthma Chief Complaint  Patient presents with  . Follow-up    Pt states cough and SOB are much improved. She is still using albuterol daily- at least 2 x. She c/o itchy eyes after uses inhalers.   much less albuterol since started dulera  100 and stopped advair  rec Ok to try dulera 100 up to 2 puffs every 12 hours but ok to leave off doses if doing great Work on inhaler technique:       02/22/2015  f/u ov/Alexandria Randolph re: cough variant asthma/  much better on dulera 100 Chief Complaint  Patient presents with  . Follow-up    Review PFT - pt reports very little cough. Still using Dulera as directed  pnds all her life, not tried zyrtec / better allegra but not eliminated / never took gerd rx as rec   Not limited by breathing from desired activities  / rare saba need   No obvious day to day or daytime variability or assoc chronic cough or cp or chest tightness, subjective wheeze or overt sinus or hb symptoms. No unusual exp hx or h/o childhood pna/ asthma or knowledge of premature birth.  Sleeping ok without nocturnal  or early am exacerbation  of respiratory  c/o's or need for noct saba. Also denies any obvious fluctuation of symptoms with weather or environmental changes or other aggravating or alleviating factors except as outlined above   Current Medications, Allergies, Complete Past Medical History, Past Surgical History, Family History, and Social History were reviewed in [employer] record.  ROS  The following are not active complaints unless bolded sore throat, dysphagia,  dental problems, itching, sneezing,  nasal congestion or excess/ purulent secretions, ear ache,   fever, chills, sweats, unintended wt loss, classically pleuritic or exertional cp, hemoptysis,  orthopnea pnd or leg swelling, presyncope, palpitations, abdominal pain, anorexia, nausea, vomiting, diarrhea  or change in bowel or bladder habits, change in stools or urine, dysuria,hematuria,  rash, arthralgias, visual complaints, headache, numbness, weakness or ataxia or problems with walking or coordination,  change in mood/affect or memory.           Objective:   Physical Exam  amb  bf nad with freq throat clearing/ mild hoarseness     .01/07/2015        144  >   02/22/2015  146  Wt Readings from Last 3 Encounters:  12/24/14 144 lb 6.4 oz (65.499 kg)  12/07/14 146 lb (66.225 kg)  11/01/14 149 lb 11.2 oz (67.903 kg)    Vital signs reviewed   HEENT: nl dentition, turbinates, and orophanx. Nl external ear canals without cough reflex   NECK :  without JVD/Nodes/TM/ nl carotid upstrokes bilaterally   LUNGS: no acc muscle use, clear to A and P bilaterally without cough on insp or exp maneuvers   CV:  RRR  no s3 or murmur or increase in P2, no edema   ABD:  soft and nontender with nl excursion in the supine position. No bruits or organomegaly, bowel sounds nl  MS:  warm without deformities, calf tenderness, cyanosis or clubbing  SKIN: warm and dry without lesions    NEURO:  alert, approp, no deficits     I personally reviewed images and agree with radiology impression as follows:  CXR:  11/01/14 1. No radiographic evidence of acute cardiopulmonary disease. 2. Atherosclerosis.      Assessment & Plan:

## 2015-02-22 NOTE — Assessment & Plan Note (Signed)
-  12/24/2014  extensive coaching HFA effectiveness =    75% so try off advair and on dulera 100 2bid > improved 01/07/2015  - 02/22/2015  extensive coaching HFA effectiveness =    75%  - PFT's nl p 34% resp to saba p no dulera that am   All goals of chronic asthma control met including optimal function and elimination of symptoms with minimal need for rescue therapy.  Contingencies discussed in full including contacting this office immediately if not controlling the symptoms using the rule of two's.     I had an extended discussion with the patient reviewing all relevant studies completed to date and  lasting 15 to 20 minutes of a 25 minute visit    Each maintenance medication was reviewed in detail including most importantly the difference between maintenance and prns and under what circumstances the prns are to be triggered using an action plan format that is not reflected in the computer generated alphabetically organized AVS.    Please see instructions for details which were reviewed in writing and the patient given a copy highlighting the part that I personally wrote and discussed at today's ov.

## 2015-05-12 ENCOUNTER — Encounter: Payer: Self-pay | Admitting: Gastroenterology

## 2015-05-24 ENCOUNTER — Other Ambulatory Visit (INDEPENDENT_AMBULATORY_CARE_PROVIDER_SITE_OTHER): Payer: Medicare Other

## 2015-05-24 ENCOUNTER — Ambulatory Visit (INDEPENDENT_AMBULATORY_CARE_PROVIDER_SITE_OTHER): Payer: Medicare Other | Admitting: Internal Medicine

## 2015-05-24 ENCOUNTER — Encounter: Payer: Self-pay | Admitting: Internal Medicine

## 2015-05-24 VITALS — BP 126/78 | HR 99 | Ht <= 58 in | Wt 153.6 lb

## 2015-05-24 DIAGNOSIS — J45991 Cough variant asthma: Secondary | ICD-10-CM | POA: Diagnosis not present

## 2015-05-24 DIAGNOSIS — J310 Chronic rhinitis: Secondary | ICD-10-CM | POA: Diagnosis not present

## 2015-05-24 LAB — CBC WITH DIFFERENTIAL/PLATELET
BASOS ABS: 0 10*3/uL (ref 0.0–0.1)
Basophils Relative: 0.7 % (ref 0.0–3.0)
Eosinophils Absolute: 0.1 10*3/uL (ref 0.0–0.7)
Eosinophils Relative: 1.1 % (ref 0.0–5.0)
HEMATOCRIT: 44.9 % (ref 36.0–46.0)
HEMOGLOBIN: 15.1 g/dL — AB (ref 12.0–15.0)
LYMPHS PCT: 25.7 % (ref 12.0–46.0)
Lymphs Abs: 1.6 10*3/uL (ref 0.7–4.0)
MCHC: 33.5 g/dL (ref 30.0–36.0)
MCV: 89.3 fl (ref 78.0–100.0)
MONOS PCT: 7.4 % (ref 3.0–12.0)
Monocytes Absolute: 0.5 10*3/uL (ref 0.1–1.0)
Neutro Abs: 4 10*3/uL (ref 1.4–7.7)
Neutrophils Relative %: 65.1 % (ref 43.0–77.0)
Platelets: 328 10*3/uL (ref 150.0–400.0)
RBC: 5.03 Mil/uL (ref 3.87–5.11)
RDW: 13.4 % (ref 11.5–15.5)
WBC: 6.1 10*3/uL (ref 4.0–10.5)

## 2015-05-24 NOTE — Assessment & Plan Note (Signed)
Trial of prn zyrtec 02/22/2015  > 05/24/2015  Did not do > rec try it again 05/24/2015  - Allergy profile 05/24/2015 >  Eos 0. /  IgE

## 2015-05-24 NOTE — Progress Notes (Signed)
Subjective:    Patient ID: Alexandria Randolph, female    DOB: Apr 21, 1934,  MRN: [id number]    Brief patient profile: 37 yobf  Healthy as child one episode of bad bronchitis in her 30 s then quit smoking around 2002 while feeling fine while living in [place] but bothered sooner thereafter  by some kind of reaction to certain foods (not consistent) >  Lips swelling / neg allergy w/u rx with benadryl prn and continued to have some itching  sneezing / water rhinitis but never lower resp symptoms but then started with new cough early august 2016 then sob > ER 11/01/14  referred to pulmonary 12/24/2014 by Dr Loanne Drilling    History of Present Illness  12/24/2014 1st [facility] office visit/ Reilley Valentine   Chief Complaint  Patient presents with  . PULMONARY CONSULT    SOB/cough. Pt c.o of productive cough with yellow/clear mucus. Pt c/o dyspnea with exertion. Pt denies wheeze/CP/tightness. Pt is currentltly our of her albuterol inhaler.  while in still in [place] developed itching sneezing running nose intermittent not seasonal and worse when got to [place] Then New cough prod clear and breathing started in early August 2016 "felt like a head cold"  and much better since seen in ER  11/01/14 rx with nebs/ steroids/ advair but not back to normal mostly daytime coughing and doe with more than slow adls rec Stop advair and start dulera 100 Take 2 puffs first thing in am and then another 2 puffs about 12 hours later.  Work on inhaler technique:   Only use your albuterol as a rescue medication   Try prilosec otc 20mg   Take 30-60 min before first meal of the day and Pepcid ac (famotidine) 20 mg one @  bedtime  GERD  Diet      01/07/2015  f/u ov/Jaelle Campanile re: cough variant asthma Chief Complaint  Patient presents with  . Follow-up    Pt states cough and SOB are much improved. She is still using albuterol daily- at least 2 x. She c/o itchy eyes after uses inhalers.   much less albuterol since started dulera 100 and  stopped advair  rec Ok to try dulera 100 up to 2 puffs every 12 hours but ok to leave off doses if doing great Work on inhaler technique:       02/22/2015  f/u ov/Kaeleigh Westendorf re: cough variant asthma/  much better on dulera 100 Chief Complaint  Patient presents with  . Follow-up    Review PFT - pt reports very little cough. Still using Dulera as directed  pnds all her life, not tried zyrtec / better allegra but did not eliminate it / never took gerd rx as rec rec Try zyrtec 10 mg at bedtime as needed for drippy nose  Continue dulera 100 Take 2 puffs first thing in am and then another 2 puffs about 12 hours later.  Only use your albuterol as a rescue medication   05/24/2015  f/u ov/Sonu Kruckenberg re: cough variant asthma/ chronic rhinitis maint on dulera 100 2bid "prn" Chief Complaint  Patient presents with  . Follow-up    Breathing is overall doing well. She states she is taking Dulera prn and uses this approx 3 x per wk on average. She rarely uses albuterol.   avg week 3 x per week x sev weeks wakes up with stuffy  Head some better p am allegra/ did not try the zyrtec as rec     No obvious day to day  or daytime variability or assoc chronic cough or cp or chest tightness, subjective wheeze or overt  hb symptoms. No unusual exp hx or h/o childhood pna/ asthma or knowledge of premature birth.  Sleeping ok without nocturnal  or early am exacerbation  of respiratory  c/o's or need for noct saba. Also denies any obvious fluctuation of symptoms with weather or environmental changes or other aggravating or alleviating factors except as outlined above   Current Medications, Allergies, Complete Past Medical History, Past Surgical History, Family History, and Social History were reviewed in [employer] record.  ROS  The following are not active complaints unless bolded sore throat, dysphagia, dental problems, itching, sneezing,  nasal congestion or excess/ purulent secretions, ear ache,    fever, chills, sweats, unintended wt loss, classically pleuritic or exertional cp, hemoptysis,  orthopnea pnd or leg swelling, presyncope, palpitations, abdominal pain, anorexia, nausea, vomiting, diarrhea  or change in bowel or bladder habits, change in stools or urine, dysuria,hematuria,  rash, arthralgias, visual complaints, headache, numbness, weakness or ataxia or problems with walking or coordination,  change in mood/affect or memory.           Objective:   Physical Exam  amb  bf nad      01/07/2015        144  >   02/22/2015  146 > 05/24/2015     12/24/14 144 lb 6.4 oz (65.499 kg)  12/07/14 146 lb (66.225 kg)  11/01/14 149 lb 11.2 oz (67.903 kg)    Vital signs reviewed   HEENT: nl dentition, turbinates, and orophanx. Nl external ear canals without cough reflex   NECK :  without JVD/Nodes/TM/ nl carotid upstrokes bilaterally   LUNGS: no acc muscle use, clear to A and P bilaterally without cough on insp or exp maneuvers   CV:  RRR  no s3 or murmur or increase in P2, no edema   ABD:  soft and nontender with nl excursion in the supine position. No bruits or organomegaly, bowel sounds nl  MS:  warm without deformities, calf tenderness, cyanosis or clubbing  SKIN: warm and dry without lesions    NEURO:  alert, approp, no deficits     I personally reviewed images and agree with radiology impression as follows:  CXR:  11/01/14 1. No radiographic evidence of acute cardiopulmonary disease. 2. Atherosclerosis.      Assessment & Plan:

## 2015-05-24 NOTE — Patient Instructions (Signed)
Work on inhaler technique:  relax and gently blow all the way out then take a nice smooth deep breath back in, triggering the inhaler at same time you start breathing in.  Hold for up to 5 seconds if you can. Blow out thru nose. Rinse and gargle with water when done  Try zyrtec every night at bedtime x sev weeks   Please remember to go to the lab   department downstairs for your tests - we will call you with the results when they are available.  Please schedule a follow up visit in 3 months but call sooner if needed

## 2015-05-24 NOTE — Assessment & Plan Note (Signed)
-  12/24/2014    try off advair and on dulera 100 2bid > improved 01/07/2015   - PFT's  02/22/15  nl p 34% resp to saba p no dulera same am    - 05/24/2015  extensive coaching HFA effectiveness =    75% (Ti too short)   All goals of chronic asthma control met including optimal function and elimination of symptoms with minimal need for rescue therapy.  Contingencies discussed in full including contacting this office immediately if not controlling the symptoms using the rule of two's.     I had an extended discussion with the patient reviewing all relevant studies completed to date and  lasting 15 to 20 minutes of a 25 minute visit    Each maintenance medication was reviewed in detail including most importantly the difference between maintenance and prns and under what circumstances the prns are to be triggered using an action plan format that is not reflected in the computer generated alphabetically organized AVS.    Please see instructions for details which were reviewed in writing and the patient given a copy highlighting the part that I personally wrote and discussed at today's ov.

## 2015-05-25 LAB — RESPIRATORY ALLERGY PROFILE REGION II ~~LOC~~
ALLERGEN, CEDAR TREE, T6: 5.14 kU/L — AB
ALLERGEN, COMM SILVER BIRCH, T3: 9.56 kU/L — AB
ALLERGEN, OAK, T7: 8.33 kU/L — AB
ALTERNARIA ALTERNATA: 0.81 kU/L — AB
Allergen, Cottonwood, t14: 10 kU/L — ABNORMAL HIGH
Allergen, D pternoyssinus,d7: 7.79 kU/L — ABNORMAL HIGH
Allergen, Mulberry, t76: 2.87 kU/L — ABNORMAL HIGH
Aspergillus fumigatus, m3: 0.85 kU/L — ABNORMAL HIGH
BERMUDA GRASS: 15.9 kU/L — AB
BOX ELDER: 7.68 kU/L — AB
Cat Dander: 0.49 kU/L — ABNORMAL HIGH
Cladosporium Herbarum: 0.52 kU/L — ABNORMAL HIGH
Cockroach: 3.35 kU/L — ABNORMAL HIGH
Common Ragweed: 6.21 kU/L — ABNORMAL HIGH
D. FARINAE: 7.07 kU/L — AB
Dog Dander: 17.4 kU/L — ABNORMAL HIGH
Elm IgE: 11.1 kU/L — ABNORMAL HIGH
IgE (Immunoglobulin E), Serum: 1077 kU/L — ABNORMAL HIGH (ref <115)
JOHNSON GRASS: 11.4 kU/L — AB
PENICILLIUM NOTATUM: 0.27 kU/L — AB
Pecan/Hickory Tree IgE: 6.92 kU/L — ABNORMAL HIGH
ROUGH PIGWEED IGE: 6.26 kU/L — AB
SHEEP SORREL IGE: 4.81 kU/L — AB
TIMOTHY GRASS: 17.5 kU/L — AB

## 2015-05-26 ENCOUNTER — Other Ambulatory Visit: Payer: Self-pay | Admitting: Internal Medicine

## 2015-05-26 DIAGNOSIS — J3081 Allergic rhinitis due to animal (cat) (dog) hair and dander: Secondary | ICD-10-CM

## 2015-05-26 DIAGNOSIS — J301 Allergic rhinitis due to pollen: Secondary | ICD-10-CM

## 2015-05-26 IMAGING — CR DG HIP COMPLETE 2+V*R*
3 series · 3 of 3 positions shown · non-contrast
Comparison: None.

CLINICAL DATA: Chronic right hip pain x1 year. No known injury.
Initial evaluation .

EXAM:
RIGHT HIP - COMPLETE 2+ VIEW

[view not recorded (1 of 3)]
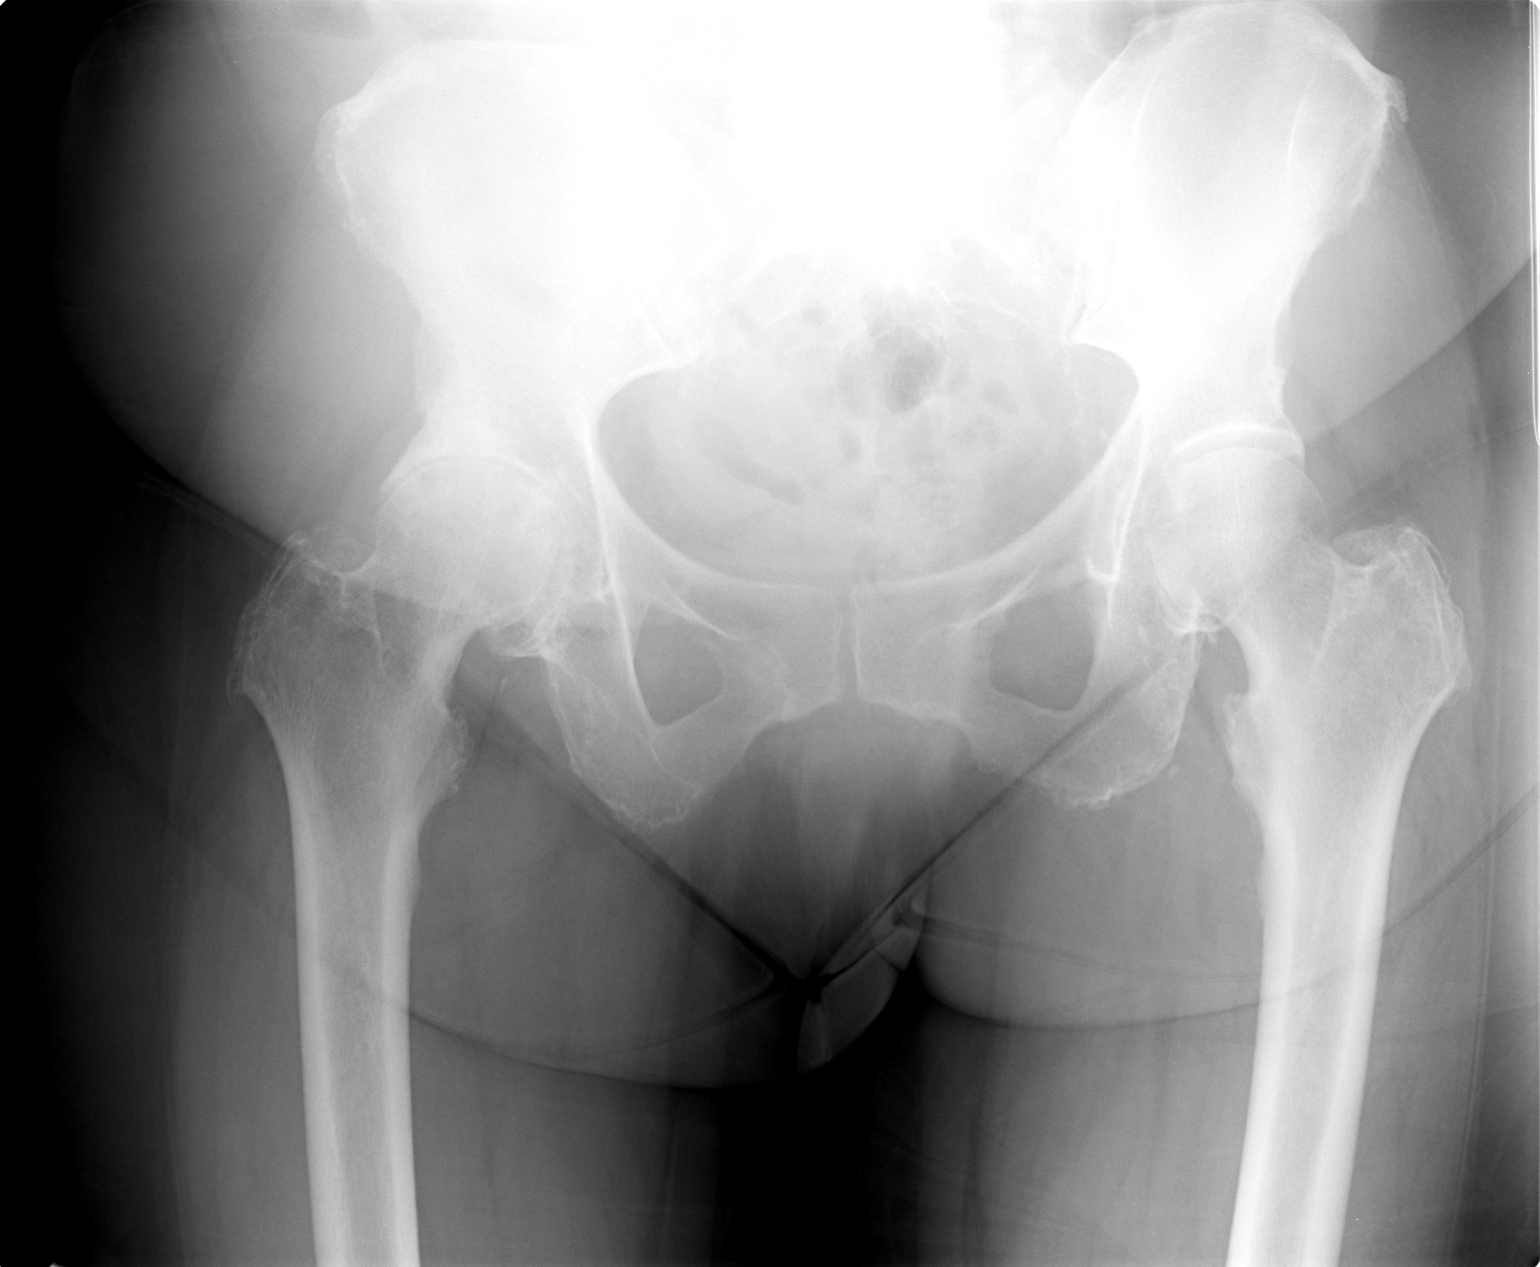

[view not recorded (2 of 3)]
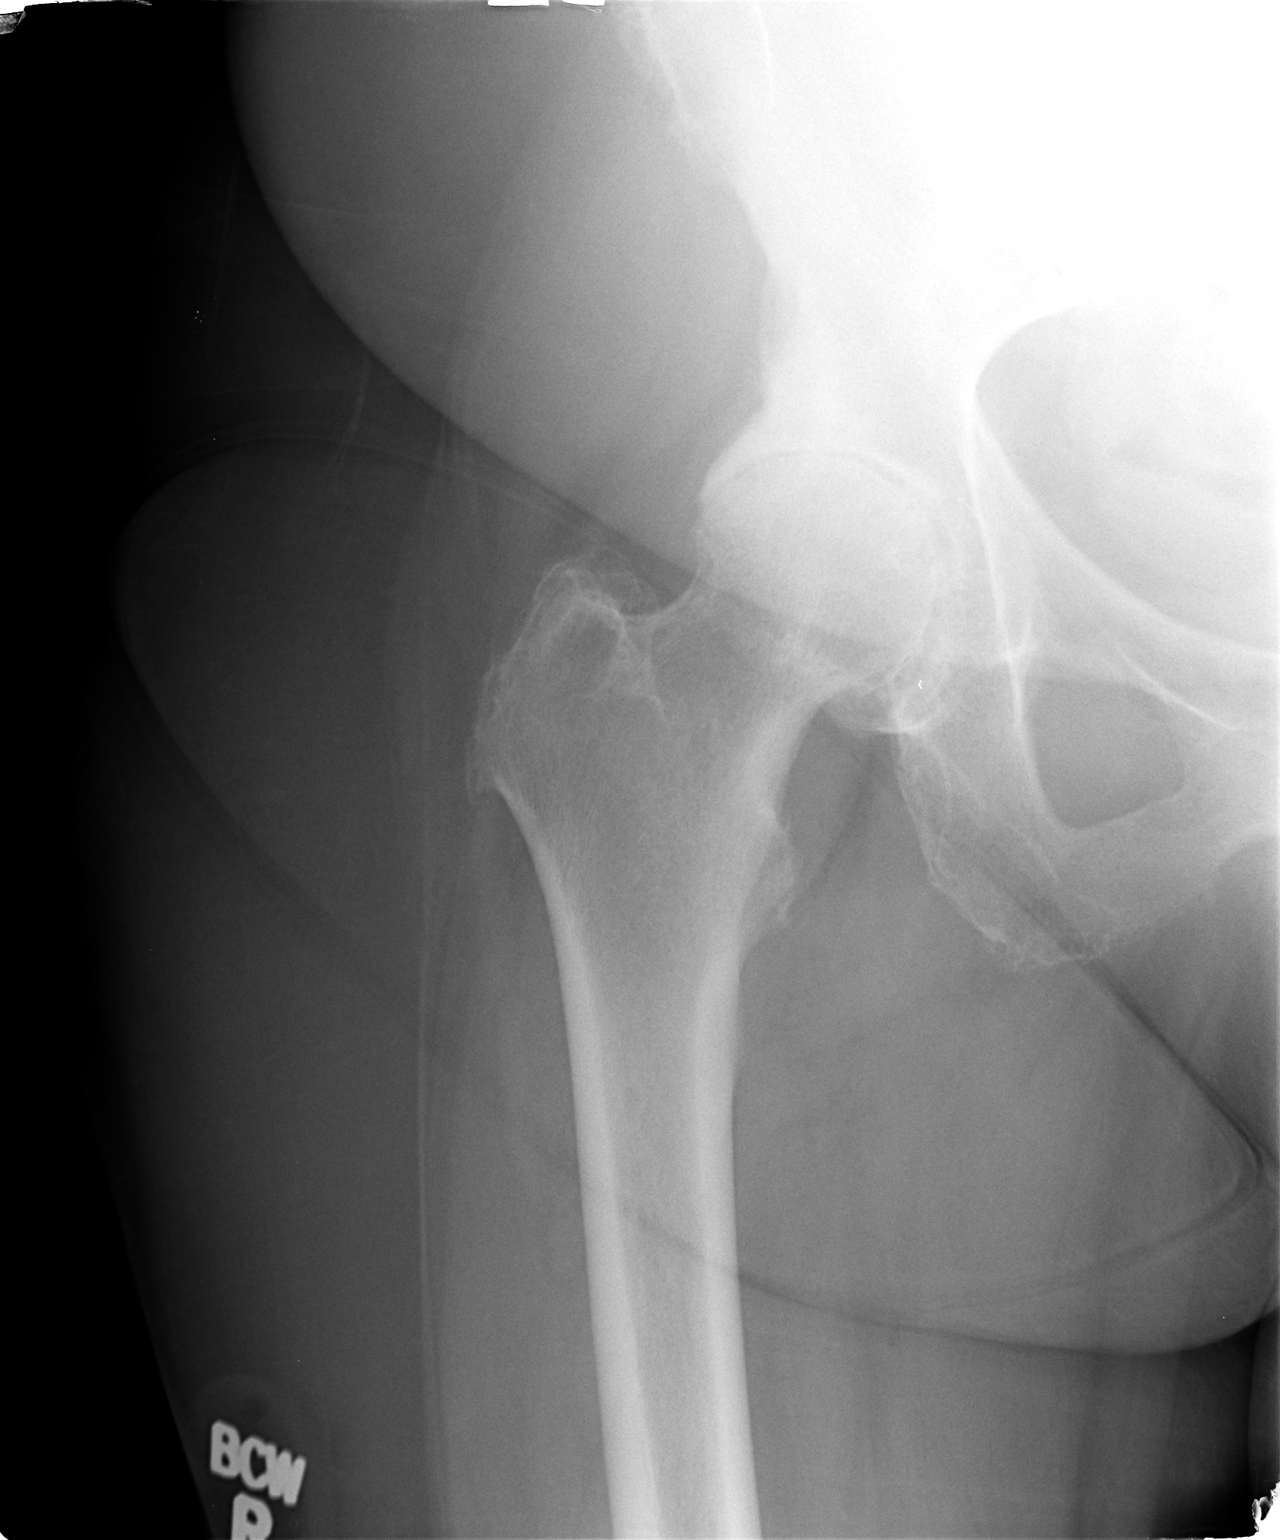

[view not recorded (3 of 3)]
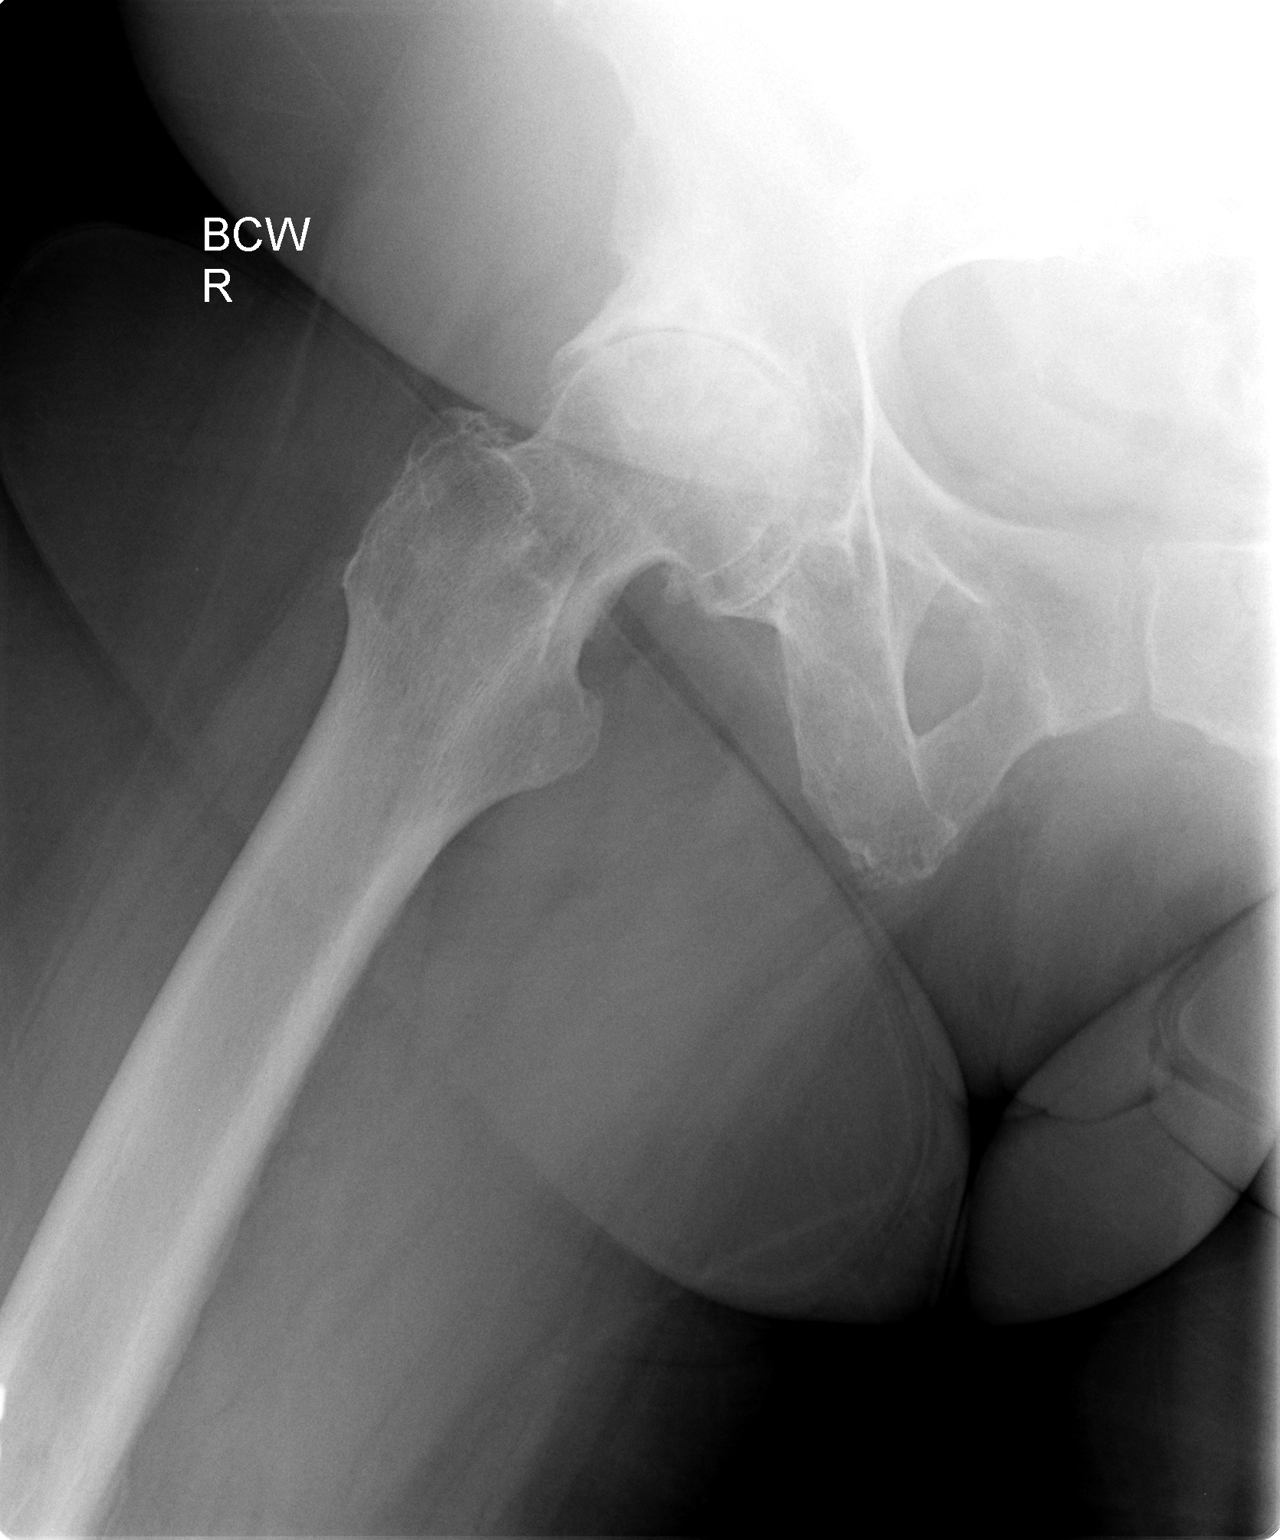

[3 of 3 positions shown; findings below may reference images not displayed]

FINDINGS: Degenerative changes lumbar spine and both hips. No acute bony
abnormality identified. Peripheral vascular calcification.
IMPRESSION: 1. Severe degenerative changes lumbar spine and both hips.
Degenerative changes about the right hip particularly severe.
2. Peripheral vascular disease.

## 2015-08-24 ENCOUNTER — Ambulatory Visit: Payer: Federal, State, Local not specified - PPO | Admitting: Internal Medicine

## 2015-12-07 ENCOUNTER — Ambulatory Visit: Payer: BC Managed Care – PPO | Admitting: Endocrinology

## 2015-12-07 DIAGNOSIS — Z0289 Encounter for other administrative examinations: Secondary | ICD-10-CM

## 2016-12-18 DIAGNOSIS — Z1231 Encounter for screening mammogram for malignant neoplasm of breast: Secondary | ICD-10-CM | POA: Diagnosis not present

## 2016-12-21 ENCOUNTER — Encounter: Payer: Self-pay | Admitting: Endocrinology

## 2016-12-21 DIAGNOSIS — N6311 Unspecified lump in the right breast, upper outer quadrant: Secondary | ICD-10-CM | POA: Diagnosis not present

## 2016-12-21 DIAGNOSIS — N6313 Unspecified lump in the right breast, lower outer quadrant: Secondary | ICD-10-CM | POA: Diagnosis not present

## 2016-12-27 ENCOUNTER — Other Ambulatory Visit: Payer: Self-pay | Admitting: Radiology

## 2016-12-27 ENCOUNTER — Telehealth: Payer: Self-pay | Admitting: Endocrinology

## 2016-12-27 ENCOUNTER — Encounter: Payer: Self-pay | Admitting: Endocrinology

## 2016-12-27 DIAGNOSIS — D0591 Unspecified type of carcinoma in situ of right breast: Secondary | ICD-10-CM | POA: Diagnosis not present

## 2016-12-27 DIAGNOSIS — C50811 Malignant neoplasm of overlapping sites of right female breast: Secondary | ICD-10-CM | POA: Diagnosis not present

## 2016-12-27 NOTE — Telephone Encounter (Signed)
If I have further questions Jessica's direct number is [phone]. They are proceeding however with breast biopsy.

## 2016-12-27 NOTE — Telephone Encounter (Signed)
Alexandria Randolph with [facility] needs to speak with nurse regarding order for breast biopsy.

## 2016-12-27 NOTE — Telephone Encounter (Signed)
I spoke with Janett Billow & am looking for paperwork in faxes to fax back letter signed by Dr. Loanne Drilling.

## 2016-12-28 ENCOUNTER — Telehealth: Payer: Self-pay | Admitting: Endocrinology

## 2016-12-28 NOTE — Telephone Encounter (Signed)
please call patient: We have received request for breast biopsy today. I have signed, but ov is due (last ov was in 2016)

## 2016-12-29 ENCOUNTER — Telehealth: Payer: Self-pay | Admitting: *Deleted

## 2016-12-29 NOTE — Telephone Encounter (Signed)
Left vm for pt to return call regarding [facility] for 01/03/17. Contact information provided.

## 2016-12-29 NOTE — Telephone Encounter (Signed)
Called and left patient VM to call back to make appt. I did state that her paperwork was signed.

## 2016-12-29 NOTE — Telephone Encounter (Signed)
Confirmed [facility] for 01/03/17 at 0815 .  Instructions and contact information given.

## 2016-12-31 ENCOUNTER — Encounter: Payer: Self-pay | Admitting: General Surgery

## 2017-01-02 ENCOUNTER — Other Ambulatory Visit: Payer: Self-pay | Admitting: *Deleted

## 2017-01-02 ENCOUNTER — Ambulatory Visit (INDEPENDENT_AMBULATORY_CARE_PROVIDER_SITE_OTHER): Payer: Medicare Other | Admitting: Endocrinology

## 2017-01-02 ENCOUNTER — Encounter: Payer: Self-pay | Admitting: Endocrinology

## 2017-01-02 VITALS — BP 170/110 | HR 97 | Wt 161.0 lb

## 2017-01-02 DIAGNOSIS — I10 Essential (primary) hypertension: Secondary | ICD-10-CM

## 2017-01-02 DIAGNOSIS — Z0000 Encounter for general adult medical examination without abnormal findings: Secondary | ICD-10-CM

## 2017-01-02 DIAGNOSIS — R739 Hyperglycemia, unspecified: Secondary | ICD-10-CM

## 2017-01-02 DIAGNOSIS — C50411 Malignant neoplasm of upper-outer quadrant of right female breast: Secondary | ICD-10-CM

## 2017-01-02 DIAGNOSIS — M810 Age-related osteoporosis without current pathological fracture: Secondary | ICD-10-CM | POA: Diagnosis not present

## 2017-01-02 DIAGNOSIS — Z170 Estrogen receptor positive status [ER+]: Principal | ICD-10-CM

## 2017-01-02 DIAGNOSIS — Z23 Encounter for immunization: Secondary | ICD-10-CM | POA: Diagnosis not present

## 2017-01-02 LAB — POCT GLYCOSYLATED HEMOGLOBIN (HGB A1C): Hemoglobin A1C: 5.3

## 2017-01-02 MED ORDER — HYDROCHLOROTHIAZIDE 12.5 MG PO TABS: 12.5000 mg | ORAL_TABLET | Freq: Every day | ORAL | 3 refills | Status: DC

## 2017-01-02 NOTE — Patient Instructions (Addendum)
good diet and exercise significantly improve your health.  please let me know if you wish to be referred to a dietician.  high blood sugar is very risky to your health.  you should see an eye doctor and dentist every year.  It is very important to get all recommended vaccinations.  Please consider these measures for your health:  minimize alcohol.  Do not use tobacco products.  Have a colonoscopy at least every 10 years from age 80.  Women should have an annual mammogram from age 45.  Keep firearms safely stored.  Always use seat belts.  have working smoke alarms in your home.  See an eye doctor and dentist regularly.  Never drive under the influence of alcohol or drugs (including prescription drugs).   It is critically important to prevent falling down (keep floor areas well-lit, dry, and free of loose objects.  If you have a cane, walker, or wheelchair, you should use it, even for short trips around the house.  Wear flat-soled shoes.  Also, try not to rush). I have sent a prescription to your pharmacy, for the blood pressure. Please come back for a blood pressure recheck in 2 weeks.  If it is still high then, we can add "amlodipine."  Please come back for a follow-up appointment in 6 months.

## 2017-01-02 NOTE — Progress Notes (Signed)
Subjective:    Patient ID: Alexandria Randolph, female    DOB: 02/16/1935, 82 y.o.   MRN: [id number]  HPI  The state of at least three ongoing medical problems is addressed today, with interval history of each noted here: Hyperglycemia: he denies sob and chest pain.  This is a stable problem. Dyslipidemia: no weight change.  This is a stable problem. HTN: he denies edema.  This is a stable problem. Past Medical History:  Diagnosis Date  . Allergy    SEASONAL  . Cataracts, bilateral   . DYSLIPIDEMIA 12/01/2006  . Environmental allergies    has had allergy test:grass,trees,plants,dust  . HYPERGLYCEMIA 12/01/2006  . HYPERTENSION 12/01/2006  . OSTEOPOROSIS 12/01/2006  . SMOKER 12/01/2006    Past Surgical History:  Procedure Laterality Date  . ABDOMINAL HYSTERECTOMY    . COLONOSCOPY    . POLYPECTOMY    . TOTAL ABDOMINAL HYSTERECTOMY W/ BILATERAL SALPINGOOPHORECTOMY  1980    Social History   Social History  . Marital status: Single    Spouse name: N/A  . Number of children: N/A  . Years of education: N/A   Occupational History  . Retired    Social History Main Topics  . Smoking status: Former Smoker    Types: Cigarettes    Quit date: 03/20/2000  . Smokeless tobacco: Never Used     Comment: Pt states that she smoked off and on- "I don't know for how long"  . Alcohol use 0.0 oz/week     Comment: occ  . Drug use: No  . Sexual activity: Not on file   Other Topics Concern  . Not on file   Social History Narrative   Married    No current outpatient prescriptions on file prior to visit.   No current facility-administered medications on file prior to visit.     Allergies  Allergen Reactions  . Losartan Potassium     REACTION: sob  . Olmesartan Medoxomil     REACTION: sob  . Simvastatin     REACTION: Malgias    Family History  Problem Relation Age of Onset  . Cancer Mother        "Throat" and colon cancer, both at an advanced age  . Diabetes Father   . Breast  cancer Paternal Aunt   . Colon cancer Neg Hx     BP (!) 170/110   Pulse 97   Wt 161 lb (73 kg)   SpO2 98%   BMI 33.65 kg/m    Review of Systems Denies palpitations and excessive diaphoresis.     Objective:   Physical Exam VITAL SIGNS:  See vs page GENERAL: no distress LUNGS:  Clear to auscultation HEART:  Regular rate and rhythm without murmurs noted. Normal S1,S2.     ecg is not done, as we are out of electrodes.      Assessment & Plan:  Hyperglycemia: due for recheck Dyslipidemia: due fore recheck HTN: he needs rx.  blood tests are requested for you today.  We'll let you know about the results. I rx'ed HCTZ   Subjective:   Patient here for Medicare annual wellness visit and management of other chronic and acute problems.    Risk factors: advanced age    56 of Physicians Providing Medical Care to Patient:  See "snapshot"  Activities of Daily Living: In your present state of health, do you have any difficulty performing the following activities (lives alone)?:  Preparing food and eating?: No  Bathing yourself:  No  Getting dressed: No  Using the toilet:No  Moving around from place to place: No  In the past year have you fallen or had a near fall?:No    Home Safety: Has smoke detector and wears seat belts. No firearms.   Opioid Use: none   Diet and Exercise  Current exercise habits: pt says not good Dietary issues discussed: pt reports a fairly healthy diet.   Depression Screen  Q1: Over the past two weeks, have you felt down, depressed or hopeless? no  Q2: Over the past two weeks, have you felt little interest or pleasure in doing things? no   The following portions of the patient's history were reviewed and updated as appropriate: allergies, current medications, past family history, past medical history, past social history, past surgical history and problem list.   Review of Systems  Denies hearing loss, and visual loss.  Objective:   Vision:   Advertising account executive, so he declines VA today.   Hearing: grossly normal. Body mass index:  See vs page.  Msk: pt easily and quickly performs "get-up-and-go" from a sitting position.  Cognitive Impairment Assessment: cognition, memory and judgment appear normal.  remembers 3/3 at 5 minutes.  excellent recall.  can easily read and write a sentence.  alert and oriented x 3.    Assessment:   Medicare wellness utd on preventive parameters    Plan:   During the course of the visit the patient was educated and counseled about appropriate screening and preventive services including:        Fall prevention is advised today  Screening mammography is UTD Bone densitometry screening is requested today Diabetes screening is UTD Nutrition counseling is offered   Vaccines are updated as needed  Patient Instructions (the written plan) was given to the patient.

## 2017-01-02 NOTE — Progress Notes (Signed)
we discussed code status.  pt requests full code, but would not want to be started or maintained on artificial life-support measures if there was not a reasonable chance of recovery 

## 2017-01-03 ENCOUNTER — Encounter: Payer: Self-pay | Admitting: Hematology and Oncology

## 2017-01-03 ENCOUNTER — Other Ambulatory Visit: Payer: Self-pay | Admitting: General Surgery

## 2017-01-03 ENCOUNTER — Ambulatory Visit
Admission: RE | Admit: 2017-01-03 | Discharge: 2017-01-03 | Disposition: A | Discharge status: Home / Self Care | Payer: Medicare Other | Source: Ambulatory Visit | Attending: Radiation Oncology | Admitting: Radiation Oncology

## 2017-01-03 ENCOUNTER — Ambulatory Visit: Payer: Medicare Other | Admitting: Physical Therapy

## 2017-01-03 ENCOUNTER — Encounter: Payer: Self-pay | Admitting: *Deleted

## 2017-01-03 ENCOUNTER — Other Ambulatory Visit (HOSPITAL_BASED_OUTPATIENT_CLINIC_OR_DEPARTMENT_OTHER): Payer: Medicare Other

## 2017-01-03 ENCOUNTER — Ambulatory Visit (HOSPITAL_BASED_OUTPATIENT_CLINIC_OR_DEPARTMENT_OTHER): Payer: Medicare Other | Admitting: Hematology and Oncology

## 2017-01-03 DIAGNOSIS — C50411 Malignant neoplasm of upper-outer quadrant of right female breast: Secondary | ICD-10-CM | POA: Diagnosis not present

## 2017-01-03 DIAGNOSIS — Z79811 Long term (current) use of aromatase inhibitors: Secondary | ICD-10-CM | POA: Diagnosis not present

## 2017-01-03 DIAGNOSIS — Z90722 Acquired absence of ovaries, bilateral: Secondary | ICD-10-CM | POA: Diagnosis not present

## 2017-01-03 DIAGNOSIS — Z87891 Personal history of nicotine dependence: Secondary | ICD-10-CM | POA: Diagnosis not present

## 2017-01-03 DIAGNOSIS — Z170 Estrogen receptor positive status [ER+]: Principal | ICD-10-CM

## 2017-01-03 DIAGNOSIS — Z9079 Acquired absence of other genital organ(s): Secondary | ICD-10-CM | POA: Diagnosis not present

## 2017-01-03 DIAGNOSIS — Z90710 Acquired absence of both cervix and uterus: Secondary | ICD-10-CM | POA: Diagnosis not present

## 2017-01-03 DIAGNOSIS — I10 Essential (primary) hypertension: Secondary | ICD-10-CM | POA: Diagnosis not present

## 2017-01-03 DIAGNOSIS — D0512 Intraductal carcinoma in situ of left breast: Secondary | ICD-10-CM | POA: Diagnosis not present

## 2017-01-03 DIAGNOSIS — Z803 Family history of malignant neoplasm of breast: Secondary | ICD-10-CM | POA: Diagnosis not present

## 2017-01-03 LAB — COMPREHENSIVE METABOLIC PANEL
ALT: 11 U/L (ref 0–55)
ANION GAP: 12 meq/L — AB (ref 3–11)
AST: 13 U/L (ref 5–34)
Albumin: 3.6 g/dL (ref 3.5–5.0)
Alkaline Phosphatase: 101 U/L (ref 40–150)
BUN: 8.4 mg/dL (ref 7.0–26.0)
CALCIUM: 9.6 mg/dL (ref 8.4–10.4)
CHLORIDE: 110 meq/L — AB (ref 98–109)
CO2: 21 mEq/L — ABNORMAL LOW (ref 22–29)
CREATININE: 0.9 mg/dL (ref 0.6–1.1)
EGFR: >60 mL/min/{1.73_m2} (ref >60)
Glucose: 124 mg/dl (ref 70–140)
POTASSIUM: 3.5 meq/L (ref 3.5–5.1)
Sodium: 143 mEq/L (ref 136–145)
Total Bilirubin: 0.88 mg/dL (ref 0.20–1.20)
Total Protein: 7.5 g/dL (ref 6.4–8.3)

## 2017-01-03 LAB — CBC WITH DIFFERENTIAL/PLATELET
BASO%: 0.4 % (ref 0.0–2.0)
BASOS ABS: 0 10*3/uL (ref 0.0–0.1)
EOS%: 2.2 % (ref 0.0–7.0)
Eosinophils Absolute: 0.1 10*3/uL (ref 0.0–0.5)
HEMATOCRIT: 44.5 % (ref 34.8–46.6)
HGB: 14.3 g/dL (ref 11.6–15.9)
LYMPH#: 0.8 10*3/uL — AB (ref 0.9–3.3)
LYMPH%: 18.1 % (ref 14.0–49.7)
MCH: 29.8 pg (ref 25.1–34.0)
MCHC: 32.1 g/dL (ref 31.5–36.0)
MCV: 92.7 fL (ref 79.5–101.0)
MONO#: 0.5 10*3/uL (ref 0.1–0.9)
MONO%: 9.7 % (ref 0.0–14.0)
NEUT#: 3.2 10*3/uL (ref 1.5–6.5)
NEUT%: 69.6 % (ref 38.4–76.8)
PLATELETS: 287 10*3/uL (ref 145–400)
RBC: 4.8 10*6/uL (ref 3.70–5.45)
RDW: 14.2 % (ref 11.2–14.5)
WBC: 4.6 10*3/uL (ref 3.9–10.3)

## 2017-01-03 NOTE — Progress Notes (Signed)
[facility] NOTE  Patient Care Team: Renato Shin, MD as PCP - General Prentiss Bells, MD (Ophthalmology)  CHIEF COMPLAINTS/PURPOSE OF CONSULTATION:  Newly diagnosed breast cancer  HISTORY OF PRESENTING ILLNESS:  Alexandria Randolph 82 y.o. female is here because of recent diagnosis of right breast cancer. Patient had a routine screening mammogram that detected a right breast mass measuring 7 mm at the 9:00 position. Axilla was negative. Biopsy of the mass revealed invasive ductal carcinoma with DCIS that was ER/PR positive HER-2 negative with a Ki-67 15%. She was presented this morning in the multidisciplinary tumor board and she is here today by herself discuss the treatment plan.  I reviewed her records extensively and collaborated the history with the patient.  SUMMARY OF ONCOLOGIC HISTORY:   Malignant neoplasm of upper-outer quadrant of right breast in female, estrogen receptor positive ([place])   12/27/2016 Initial Diagnosis    Screening detected right breast mass 7 mm at 9o clock position, axilla negative, biopsy: IDC grade 2 with DCIS ER 100%, PR 100%, Ki-67 15%,HER-2 negative ratio 1.48 T1b N0 stage IA clinical stage       MEDICAL HISTORY:  Past Medical History:  Diagnosis Date  . Allergy    SEASONAL  . Cataracts, bilateral   . DYSLIPIDEMIA 12/01/2006  . Environmental allergies    has had allergy test:grass,trees,plants,dust  . HYPERGLYCEMIA 12/01/2006  . HYPERTENSION 12/01/2006  . OSTEOPOROSIS 12/01/2006  . SMOKER 12/01/2006    SURGICAL HISTORY: Past Surgical History:  Procedure Laterality Date  . ABDOMINAL HYSTERECTOMY    . COLONOSCOPY    . POLYPECTOMY    . TOTAL ABDOMINAL HYSTERECTOMY W/ BILATERAL SALPINGOOPHORECTOMY  1980    SOCIAL HISTORY: Social History   Social History  . Marital status: Single    Spouse name: N/A  . Number of children: N/A  . Years of education: N/A   Occupational History  . Retired    Social History Main  Topics  . Smoking status: Former Smoker    Types: Cigarettes    Quit date: 03/20/2000  . Smokeless tobacco: Never Used     Comment: Pt states that she smoked off and on- "I don't know for how long"  . Alcohol use 0.0 oz/week     Comment: occ  . Drug use: No  . Sexual activity: Not on file   Other Topics Concern  . Not on file   Social History Narrative   Married    FAMILY HISTORY: Family History  Problem Relation Age of Onset  . Cancer Mother        "Throat" and colon cancer, both at an advanced age  . Diabetes Father   . Breast cancer Paternal Aunt   . Colon cancer Neg Hx     ALLERGIES:  is allergic to losartan potassium; olmesartan medoxomil; and simvastatin.  MEDICATIONS:  Current Outpatient Prescriptions  Medication Sig Dispense Refill  . cetirizine (ZYRTEC) 10 MG tablet Take 10 mg by mouth daily.    . hydrochlorothiazide (HYDRODIURIL) 12.5 MG tablet Take 1 tablet (12.5 mg total) by mouth daily. 90 tablet 3   No current facility-administered medications for this visit.     REVIEW OF SYSTEMS:   Constitutional: Denies fevers, chills or abnormal night sweats Eyes: Denies blurriness of vision, double vision or watery eyes Ears, nose, mouth, throat, and face: Denies mucositis or sore throat Respiratory: Denies cough, dyspnea or wheezes Cardiovascular: Denies palpitation, chest discomfort or lower extremity swelling Gastrointestinal:  Denies nausea, heartburn or  change in bowel habits Skin: Denies abnormal skin rashes Lymphatics: Denies new lymphadenopathy or easy bruising Neurological:Denies numbness, tingling or new weaknesses Behavioral/Psych: Mood is stable, no new changes  Breast:  Denies any palpable lumps or discharge All other systems were reviewed with the patient and are negative.  PHYSICAL EXAMINATION: ECOG PERFORMANCE STATUS: 0 - Asymptomatic  Vitals:   01/03/17 0906  BP: (!) 151/72  Pulse: 95  Resp: 20  Temp: 98.3 F (36.8 C)  SpO2: 100%    Filed Weights   01/03/17 0906  Weight: 161 lb 11.2 oz (73.3 kg)    GENERAL:alert, no distress and comfortable SKIN: skin color, texture, turgor are normal, no rashes or significant lesions EYES: normal, conjunctiva are pink and non-injected, sclera clear OROPHARYNX:no exudate, no erythema and lips, buccal mucosa, and tongue normal  NECK: supple, thyroid normal size, non-tender, without nodularity LYMPH:  no palpable lymphadenopathy in the cervical, axillary or inguinal LUNGS: clear to auscultation and percussion with normal breathing effort HEART: regular rate & rhythm and no murmurs and no lower extremity edema ABDOMEN:abdomen soft, non-tender and normal bowel sounds Musculoskeletal:no cyanosis of digits and no clubbing  PSYCH: alert & oriented x 3 with fluent speech NEURO: no focal motor/sensory deficits BREAST: No palpable nodules in breast. No palpable axillary or supraclavicular lymphadenopathy (exam performed in the presence of a chaperone)   LABORATORY DATA:  I have reviewed the data as listed Lab Results  Component Value Date   WBC 4.6 01/03/2017   HGB 14.3 01/03/2017   HCT 44.5 01/03/2017   MCV 92.7 01/03/2017   PLT 287 01/03/2017   Lab Results  Component Value Date   NA 143 01/03/2017   K 3.5 01/03/2017   CL 102 11/01/2014   CO2 21 (L) 01/03/2017    RADIOGRAPHIC STUDIES: I have personally reviewed the radiological reports and agreed with the findings in the report.  ASSESSMENT AND PLAN:  Malignant neoplasm of upper-outer quadrant of right breast in female, estrogen receptor positive ([place]) 12/27/2016: Screening detected right breast mass 7 mm at 95 position, axilla negative, biopsy: IDC grade 2 with DCIS ER 100%, PR and a pleasant, Ki-67 15%,HER-2 negative ratio 1.48 T1b N0 stage IA clinical stage.  Pathology and radiology counseling: Discussed with the patient, the details of pathology including the type of breast cancer,the clinical staging, the significance  of ER, PR and HER-2/neu receptors and the implications for treatment. After reviewing the pathology in detail, we proceeded to discuss the different treatment options between surgery, radiation, antiestrogen therapies.  Recommendation: 1. Breast conserving surgery 2. Adjuvant radiation therapy 3. Adjuvant antiestrogen therapy  Anastrozole counseling: We discussed the risks and benefits of anti-estrogen therapy with aromatase inhibitors. These include but not limited to insomnia, hot flashes, mood changes, vaginal dryness, bone density loss, and weight gain. We strongly believe that the benefits far outweigh the risks. Patient understands these risks and consented to starting treatment. Planned treatment duration is 5 years.   Return to clinic after surgery to discuss final pathology report    All questions were answered. The patient knows to call the clinic with any problems, questions or concerns.    Rulon Eisenmenger, MD 01/03/17

## 2017-01-03 NOTE — Progress Notes (Signed)
Radiation Oncology         [phone] ________________________________  Initial Outpatient Consultation  Name: Alexandria Randolph MRN: [id number]  Date: 01/03/2017  DOB: 1934/04/07  CL:EXNTZGY, Hilliard Clark, MD  Fanny Skates, MD   REFERRING PHYSICIAN: Fanny Skates, MD  DIAGNOSIS: The encounter diagnosis was Malignant neoplasm of upper-outer quadrant of right breast in female, estrogen receptor positive ([place]).   Classification:ClinicalForm:Breast, AJCC 8th Edition Stage IA (cT1b, cN0, cM0, G2, ER: Positive, PR: Positive, HER2: Negative)   HISTORY OF PRESENT ILLNESS::Alexandria Randolph is a 82 y.o. female who is seen today in our multidisciplinary breast clinic. The patient presented for routine screening mammogram on 12/18/16 which showed a 0.7 cm oval mass in the right breast which is indeterminate. Accordingly she underwent diagnostic mammogram and breast ultrasound on 12/21/16. These scans showed a 0.7 cm lobulated mass in the right breast suspicious for malignancy.  The right breast at 9:00 o'clock position was biopsied on 12/27/16 revealing invasive ductal carcinoma, grade II with associated DCIS, ER 100%, PR 100%, HER-2 negative, Ki-67 15%.   The patient has a family history of breast cancer including a paternal aunt who was diagnosed at age 21.  The patient is here for further evaluation and discussion of radiation treatment options in the management of her disease.  PREVIOUS RADIATION THERAPY: No  PAST MEDICAL HISTORY:  has a past medical history of Allergy; Cataracts, bilateral; DYSLIPIDEMIA (12/01/2006); Environmental allergies; HYPERGLYCEMIA (12/01/2006); HYPERTENSION (12/01/2006); OSTEOPOROSIS (12/01/2006); and SMOKER (12/01/2006).    PAST SURGICAL HISTORY: Past Surgical History:  Procedure Laterality Date  . ABDOMINAL HYSTERECTOMY    . COLONOSCOPY    . POLYPECTOMY    . TOTAL ABDOMINAL HYSTERECTOMY W/ BILATERAL SALPINGOOPHORECTOMY  1980    FAMILY HISTORY: family history  includes Breast cancer in her paternal aunt; Cancer in her mother; Diabetes in her father.  SOCIAL HISTORY:  reports that she quit smoking about 16 years ago. Her smoking use included Cigarettes. She has never used smokeless tobacco. She reports that she drinks alcohol. She reports that she does not use drugs.  ALLERGIES: Losartan potassium; Olmesartan medoxomil; and Simvastatin  MEDICATIONS:  Current Outpatient Prescriptions  Medication Sig Dispense Refill  . cetirizine (ZYRTEC) 10 MG tablet Take 10 mg by mouth daily.    . hydrochlorothiazide (HYDRODIURIL) 12.5 MG tablet Take 1 tablet (12.5 mg total) by mouth daily. 90 tablet 3   No current facility-administered medications for this encounter.    Gynecologic History  Age at first menstrual period? 15  Are you still having periods? No Approximate date of last period? 1980  If you are still having periods: Are your periods regular? N/A  If you no longer have periods: Have you used hormone replacement? Yes  If YES, for how long? 1 year When did you stop? 1981 Obstetric History:  How many children have you carried to term? 0 Your age at first live birth? N/A  Pregnant now or trying to get pregnant? No  Have you used birth control pills or hormone shots for contraception? No  If so, for how long (or approximate dates)? N/A  Would you be interested in learning more about the options to preserve fertility? N/A Health Maintenance:  Have you ever had a colonoscopy? N/A If yes, date? N/A  Have you ever had a bone density? N/A If yes, date? N/A  Date of your last PAP smear? N/A Date of your FIRST mammogram? N/A   REVIEW OF SYSTEMS: REVIEW OF SYSTEMS: A 10+ POINT REVIEW OF  SYSTEMS WAS OBTAINED including neurology, dermatology, psychiatry, cardiac, respiratory, lymph, extremities, GI, GU, musculoskeletal, constitutional, reproductive, HEENT. All pertinent positives are noted in the HPI. All others are negative.   PHYSICAL EXAM:  Vitals with  BMI 01/03/2017  Height '4\' 10"'$   Weight 161 lbs 11 oz  BMI 89.3  Systolic 810  Diastolic 72  Pulse 95  Respirations 20   Lungs are clear to auscultation bilaterally. Heart has regular rate and rhythm. No palpable cervical, supraclavicular, or axillary adenopathy. Abdomen soft, non-tender, normal bowel sounds. Left breast shows no palpable mass or nipple discharge. Right breast shows no palpable mass or nipple discharge.    ECOG = 1  0 - Asymptomatic (Fully active, able to carry on all predisease activities without restriction)  1 - Symptomatic but completely ambulatory (Restricted in physically strenuous activity but ambulatory and able to carry out work of a light or sedentary nature. For example, light housework, office work)  2 - Symptomatic, <50% in bed during the day (Ambulatory and capable of all self care but unable to carry out any work activities. Up and about more than 50% of waking hours)  3 - Symptomatic, >50% in bed, but not bedbound (Capable of only limited self-care, confined to bed or chair 50% or more of waking hours)  4 - Bedbound (Completely disabled. Cannot carry on any self-care. Totally confined to bed or chair)  5 - Death   Eustace Pen MM, Creech RH, Tormey DC, et al. [phone]). "Toxicity and response criteria of the [facility] Group". [place] Oncol. 5 (6): 649-55  LABORATORY DATA:  Lab Results  Component Value Date   WBC 4.6 01/03/2017   HGB 14.3 01/03/2017   HCT 44.5 01/03/2017   MCV 92.7 01/03/2017   PLT 287 01/03/2017   NEUTROABS 3.2 01/03/2017   Lab Results  Component Value Date   NA 143 01/03/2017   K 3.5 01/03/2017   CL 102 11/01/2014   CO2 21 (L) 01/03/2017   GLUCOSE 124 01/03/2017   CREATININE 0.9 01/03/2017   CALCIUM 9.6 01/03/2017      RADIOGRAPHY: No results found.    IMPRESSION: 82 year-old woman with invasive ductal carcinoma of the right breast, grade II, ER/PR+, HER-2 negative.    Classification:ClinicalForm:Breast, AJCC 8th Edition Stage IA (cT1b, cN0, cM0, G2, ER: Positive, PR: Positive, HER2: Negative)   Stage I invasive ductal carcinoma of the right breast. Patient appears to have a small lesion estimated to be approximately 0.7 cm. Patient would be a candidate for breast conservation therapy and she does wish to proceed with lumpectomy. Given her excellent prognosis we would not recommend axillary node evaluation. If the patient is found to have a small lesion as expected with clear margins (less than 2 cm) and the patient agrees to proceed with adjuvant hormonal therapy then she could avoid adjuvant radiation therapy. At this time this is the treatment approach she would like to proceed with I.e. lumpectomy followed by hormonal therapy and no adjuvant radiation treatment.  PLAN: The patient will proceed with partial mastectomy, unless she is found to have a larger tumor than expected she will then proceed with adjuvant hormonal therapy and no radiation therapy as part of her management.      ------------------------------------------------  Blair Promise, PhD, MD  This document serves as a record of services personally performed by Gery Pray, MD. It was created on his behalf by Arlyce Harman, a trained medical scribe. The creation of this record is based  on the scribe's personal observations and the provider's statements to them. This document has been checked and approved by the attending provider.

## 2017-01-03 NOTE — Progress Notes (Unsigned)
Nutrition Assessment  Reason for Assessment:  Pt seen in Breast Clinic  ASSESSMENT:   82 year old female with right breast cancer. Past medical history of HTN, asthm, smoker, dyslipidemia.  Patient reports for the past 2 weeks decreased appetite. Feels it is related to allergies, nasal drainage.  "I have just not been that hungry lately."  Reports typically has eggs, bacon or sausage for breakfast, then noon time may drink a boost shake or have something light. Dinner meal is usually salad or pasta or meat and vegetables.  Reports weight has been stable, "too much"  Medications:  zyrtec  Labs: reviewed  Anthropometrics:   Height: 4 feet 10 inches Weight: 161 lb 11.2 oz and increasing BMI: 33  Patient reports stable weight during 2 weeks with decreased intake   NUTRITION DIAGNOSIS: Food and nutrition related knowledge deficit related to new diagnosis of breast cancer as evidenced by no prior need for nutrition related information.  INTERVENTION:   Discussed and provided packet of information regarding nutritional tips for breast cancer patients.  Patient relieved that she is consuming healthy foods and eating better than she thought.  Questions answered.  Teachback method used.  Contact information provided and patient knows to contact me with questions/concerns.    MONITORING, EVALUATION, and GOAL: Pt will consume a healthy plant based diet to maintain lean body mass throughout treatment.   Elita Dame B. Zenia Resides, [place], [place] Registered Dietitian [phone] (pager)

## 2017-01-03 NOTE — Assessment & Plan Note (Signed)
12/27/2016: Screening detected right breast mass 7 mm at 95 position, axilla negative, biopsy: IDC grade 2 with DCIS ER 100%, PR and a pleasant, Ki-67 15%,HER-2 negative ratio 1.48 T1b N0 stage IA clinical stage.  Pathology and radiology counseling: Discussed with the patient, the details of pathology including the type of breast cancer,the clinical staging, the significance of ER, PR and HER-2/neu receptors and the implications for treatment. After reviewing the pathology in detail, we proceeded to discuss the different treatment options between surgery, radiation, antiestrogen therapies.  Recommendation: 1. Breast conserving surgery 2. Adjuvant radiation therapy 3. Adjuvant antiestrogen therapy  Anastrozole counseling: We discussed the risks and benefits of anti-estrogen therapy with aromatase inhibitors. These include but not limited to insomnia, hot flashes, mood changes, vaginal dryness, bone density loss, and weight gain. We strongly believe that the benefits far outweigh the risks. Patient understands these risks and consented to starting treatment. Planned treatment duration is 5 years.   Return to clinic after surgery to discuss final pathology report

## 2017-01-03 NOTE — Progress Notes (Signed)
Clinical Social Work [facility] Psychosocial Distress Screening [place]  Patient completed distress screening protocol and scored a 8 on the Psychosocial Distress Thermometer which indicates moderate distress. Clinical Social Worker met with patient in [facility] to assess for distress and other psychosocial needs. Patient stated she was feeling overwhelmed but felt "better" after meeting with the treatment team and getting more information on her treatment plan. CSW and patient discussed common feeling and emotions when being diagnosed with cancer, and the importance of support during treatment. CSW informed patient of the support team and support services at [facility]. CSW provided contact information and encouraged patient to call with any questions or concerns.  ONCBCN DISTRESS SCREENING 01/03/2017  Screening Type Initial Screening  Distress experienced in past week (1-10) 8  Emotional problem type Nervousness/Anxiety;Adjusting to illness  Information Concerns Type Lack of info about treatment  Physical Problem type Loss of appetitie  Physician notified of physical symptoms Yes     Alexandria Randolph, MSW, LCSW, OSW-C Clinical Social Worker [place] [phone]

## 2017-01-09 ENCOUNTER — Telehealth: Payer: Self-pay | Admitting: *Deleted

## 2017-01-09 NOTE — Telephone Encounter (Signed)
  Oncology Nurse Navigator Documentation  Navigator Location: [facility] (01/09/17 1500)   )Navigator Encounter Type: Telephone;[facility] Follow-up (01/09/17 1500) Telephone: Outgoing Call;Clinic/[facility] Follow-up (01/09/17 1500)                                                  Time Spent with Patient: 15 (01/09/17 1500)

## 2017-01-14 NOTE — H&P (Signed)
Alexandria Randolph Location: [facility] Patient #: [id number] DOB: [birth year] Undefined / Language: Cleophus Molt / Race: Black or African American Female        History of Present Illness       This is an 82 year old female seen in the [place] today. She is referred by Dr. Isaiah Blakes at [facility] imaging for evaluation and management of a small invasive cancer in the right breast laterally. Dr. Renato Shin is her PCP. Dr. Lindi Adie and Dr. Sondra Come are involved her cancer care.      She has no prior breast problems. Get screening mammography. Recent imaging shows a 7 mm mass in the right breast at the 9 o'clock position. Core biopsy shows invasive ductal carcinoma mixed with DCIS. ER 100%. PR 100%. Ki-67 15%. HER-2 negative.      Past history significant for hypertension. Former smoker quit many years ago. Was on inhalers for a while but these were stopped. SOME allergies. History TAH and BSO for benign disease     Family history reveals mother died of throat and colon cancer. Father had diabetes. A paternal aunt had breast cancer age 55 Social history reveals she is single. No children. Retired. Quit smoking many years ago. Drinks alcohol occasionally      We had a long talk. We talked about clinical stage of her disease. We talked about lumpectomy and mastectomy. We talked about lymph node biopsies. None of the treating physicians feel that she would benefit from sentinel node biopsy as it would not influence decisions regarding chemotherapy or radiation therapy and she is clinically node negative. She is strongly motivated for breast conservation and I think she is an excellent candidate for that. She will be scheduled for right breast lumpectomy with radioactive seed localization. I discussed the indications, details, techniques, and risks of the surgery in detail. She is aware of the risk of bleeding, infection, cosmetic deformity, reoperation for positive margins, shoulder  disability, nerve damage with chronic pain, and other unforeseen problems. She understands all these issues well. All her questions were answered. She agrees with this plan. Assuming pathology is consistent with her clinical diagnosis, she will probably receive antiestrogen therapy but be able to forego radiation therapy. Those decisions will be made later.   Medication History  Medications Reconciled    Physical Exam  General Mental Status-Alert. General Appearance-Consistent with stated age. Hydration-Well hydrated. Voice-Normal.  Head and Neck Head-normocephalic, atraumatic with no lesions or palpable masses. Trachea-midline. Thyroid Gland Characteristics - normal size and consistency.  Eye Eyeball - Bilateral-Extraocular movements intact. Sclera/Conjunctiva - Bilateral-No scleral icterus.  Chest and Lung Exam Chest and lung exam reveals -quiet, even and easy respiratory effort with no use of accessory muscles and on auscultation, normal breath sounds, no adventitious sounds and normal vocal resonance. Inspection Chest Wall - Normal. Back - normal.  Breast Note: Breasts are moderately large. No palpable mass in either breast. Minimal ecchymoses right lateral breast. No hematoma. No axillary adenopathy on either side.   Cardiovascular Cardiovascular examination reveals -normal heart sounds, regular rate and rhythm with no murmurs and normal pedal pulses bilaterally.  Abdomen Inspection Inspection of the abdomen reveals - No Hernias. Skin - Scar - no surgical scars. Palpation/Percussion Palpation and Percussion of the abdomen reveal - Soft, Non Tender, No Rebound tenderness, No Rigidity (guarding) and No hepatosplenomegaly. Auscultation Auscultation of the abdomen reveals - Bowel sounds normal.  Neurologic Neurologic evaluation reveals -alert and oriented x 3 with no impairment of recent or remote memory.  Mental  Status-Normal.  Musculoskeletal Normal Exam - Left-Upper Extremity Strength Normal and Lower Extremity Strength Normal. Normal Exam - Right-Upper Extremity Strength Normal and Lower Extremity Strength Normal.  Lymphatic Head & Neck  General Head & Neck Lymphatics: Bilateral - Description - Normal. Axillary  General Axillary Region: Bilateral - Description - Normal. Tenderness - Non Tender. Femoral & Inguinal  Generalized Femoral & Inguinal Lymphatics: Bilateral - Description - Normal. Tenderness - Non Tender.    Assessment & Plan  PRIMARY CANCER OF UPPER OUTER QUADRANT OF RIGHT FEMALE BREAST (C50.411)   your recent imaging studies and biopsies show invasive ductal carcinoma of the right breast laterallyive ductal carcinoma of the right breast laterally at 9:00 position This is only 7 mm in diameter  we have discussed treatment plan in general we have discussed options for surgery You have stated that your preference is breast conservation, and I think you are an excellent candidate for that.  You will be scheduled for right breast lumpectomy with radioactive seed localization we have iscussed the indications, techniques, and risks of the surgery in detail Dr. Darrel Hoover office will call you tomorrow to begin the scheduling process  HYPERTENSION, ESSENTIAL (I10) HISTORY OF TOTAL ABDOMINAL HYSTERECTOMY AND BILATERAL SALPINGO-OOPHORECTOMY (Z90.710) FAMILY HISTORY OF BREAST CANCER IN FEMALE (Z80.3) Impression: paternal aunt, breast cancer, age 80 FORMER SMOKER (Z23.891)    Edsel Petrin. Dalbert Batman, M.D., [facility] General and Minimally invasive Surgery Breast and Colorectal Surgery Office:   [phone] Pager:   [phone]

## 2017-01-15 ENCOUNTER — Encounter (HOSPITAL_BASED_OUTPATIENT_CLINIC_OR_DEPARTMENT_OTHER): Payer: Self-pay | Admitting: *Deleted

## 2017-01-16 ENCOUNTER — Encounter (HOSPITAL_BASED_OUTPATIENT_CLINIC_OR_DEPARTMENT_OTHER)
Admission: RE | Admit: 2017-01-16 | Discharge: 2017-01-16 | Disposition: A | Discharge status: Home / Self Care | Payer: Medicare Other | Source: Ambulatory Visit | Attending: General Surgery | Admitting: General Surgery

## 2017-01-16 ENCOUNTER — Ambulatory Visit: Payer: Medicare Other

## 2017-01-16 VITALS — BP 142/78

## 2017-01-16 DIAGNOSIS — Z833 Family history of diabetes mellitus: Secondary | ICD-10-CM | POA: Diagnosis not present

## 2017-01-16 DIAGNOSIS — I10 Essential (primary) hypertension: Secondary | ICD-10-CM

## 2017-01-16 DIAGNOSIS — M1990 Unspecified osteoarthritis, unspecified site: Secondary | ICD-10-CM | POA: Diagnosis not present

## 2017-01-16 DIAGNOSIS — Z170 Estrogen receptor positive status [ER+]: Secondary | ICD-10-CM | POA: Diagnosis not present

## 2017-01-16 DIAGNOSIS — J45909 Unspecified asthma, uncomplicated: Secondary | ICD-10-CM | POA: Diagnosis not present

## 2017-01-16 DIAGNOSIS — C50411 Malignant neoplasm of upper-outer quadrant of right female breast: Secondary | ICD-10-CM | POA: Diagnosis present

## 2017-01-16 DIAGNOSIS — Z87891 Personal history of nicotine dependence: Secondary | ICD-10-CM | POA: Diagnosis not present

## 2017-01-16 DIAGNOSIS — Z803 Family history of malignant neoplasm of breast: Secondary | ICD-10-CM | POA: Diagnosis not present

## 2017-01-16 DIAGNOSIS — Z90710 Acquired absence of both cervix and uterus: Secondary | ICD-10-CM | POA: Diagnosis not present

## 2017-01-16 DIAGNOSIS — Z800 Family history of malignant neoplasm of digestive organs: Secondary | ICD-10-CM | POA: Diagnosis not present

## 2017-01-16 DIAGNOSIS — C50511 Malignant neoplasm of lower-outer quadrant of right female breast: Secondary | ICD-10-CM | POA: Diagnosis not present

## 2017-01-17 ENCOUNTER — Telehealth: Payer: Self-pay | Admitting: Hematology and Oncology

## 2017-01-17 DIAGNOSIS — C50811 Malignant neoplasm of overlapping sites of right female breast: Secondary | ICD-10-CM | POA: Diagnosis not present

## 2017-01-17 NOTE — Telephone Encounter (Signed)
Left voicemail for patient regarding appt added per 10/29 sch msg. Sending confirmation letter in the mail.

## 2017-01-19 ENCOUNTER — Ambulatory Visit (HOSPITAL_BASED_OUTPATIENT_CLINIC_OR_DEPARTMENT_OTHER): Payer: Medicare Other | Admitting: Anesthesiology

## 2017-01-19 ENCOUNTER — Encounter (HOSPITAL_BASED_OUTPATIENT_CLINIC_OR_DEPARTMENT_OTHER): Payer: Self-pay | Admitting: *Deleted

## 2017-01-19 ENCOUNTER — Ambulatory Visit (HOSPITAL_BASED_OUTPATIENT_CLINIC_OR_DEPARTMENT_OTHER)
Admission: RE | Admit: 2017-01-19 | Discharge: 2017-01-19 | Disposition: A | Discharge status: Home / Self Care | Payer: Medicare Other | Source: Ambulatory Visit | Attending: General Surgery | Admitting: General Surgery

## 2017-01-19 ENCOUNTER — Encounter (HOSPITAL_BASED_OUTPATIENT_CLINIC_OR_DEPARTMENT_OTHER)
Admission: RE | Disposition: A | Discharge status: Home / Self Care | Payer: Self-pay | Source: Ambulatory Visit | Attending: General Surgery

## 2017-01-19 DIAGNOSIS — Z833 Family history of diabetes mellitus: Secondary | ICD-10-CM | POA: Diagnosis not present

## 2017-01-19 DIAGNOSIS — Z170 Estrogen receptor positive status [ER+]: Secondary | ICD-10-CM | POA: Diagnosis not present

## 2017-01-19 DIAGNOSIS — C50511 Malignant neoplasm of lower-outer quadrant of right female breast: Secondary | ICD-10-CM | POA: Diagnosis not present

## 2017-01-19 DIAGNOSIS — Z87891 Personal history of nicotine dependence: Secondary | ICD-10-CM | POA: Insufficient documentation

## 2017-01-19 DIAGNOSIS — Z90710 Acquired absence of both cervix and uterus: Secondary | ICD-10-CM | POA: Insufficient documentation

## 2017-01-19 DIAGNOSIS — J45909 Unspecified asthma, uncomplicated: Secondary | ICD-10-CM | POA: Diagnosis not present

## 2017-01-19 DIAGNOSIS — Z800 Family history of malignant neoplasm of digestive organs: Secondary | ICD-10-CM | POA: Insufficient documentation

## 2017-01-19 DIAGNOSIS — C50811 Malignant neoplasm of overlapping sites of right female breast: Secondary | ICD-10-CM | POA: Diagnosis not present

## 2017-01-19 DIAGNOSIS — R739 Hyperglycemia, unspecified: Secondary | ICD-10-CM | POA: Diagnosis not present

## 2017-01-19 DIAGNOSIS — Z803 Family history of malignant neoplasm of breast: Secondary | ICD-10-CM | POA: Insufficient documentation

## 2017-01-19 DIAGNOSIS — M1990 Unspecified osteoarthritis, unspecified site: Secondary | ICD-10-CM | POA: Insufficient documentation

## 2017-01-19 DIAGNOSIS — I10 Essential (primary) hypertension: Secondary | ICD-10-CM | POA: Diagnosis not present

## 2017-01-19 DIAGNOSIS — C50411 Malignant neoplasm of upper-outer quadrant of right female breast: Secondary | ICD-10-CM

## 2017-01-19 DIAGNOSIS — C50911 Malignant neoplasm of unspecified site of right female breast: Secondary | ICD-10-CM | POA: Diagnosis not present

## 2017-01-19 HISTORY — PX: BREAST LUMPECTOMY WITH RADIOACTIVE SEED LOCALIZATION: SHX6424

## 2017-01-19 SURGERY — BREAST LUMPECTOMY WITH RADIOACTIVE SEED LOCALIZATION
Anesthesia: General | Site: Breast | Laterality: Right

## 2017-01-19 MED ORDER — MIDAZOLAM HCL 2 MG/2ML IJ SOLN: 1.0000 mg | INTRAMUSCULAR | Status: DC | PRN

## 2017-01-19 MED ORDER — GABAPENTIN 300 MG PO CAPS
300.0000 mg | ORAL_CAPSULE | ORAL | Status: AC
  Administered 2017-01-19: 300 mg via ORAL

## 2017-01-19 MED ORDER — BUPIVACAINE-EPINEPHRINE (PF) 0.5% -1:200000 IJ SOLN
INTRAMUSCULAR | Status: DC | PRN
  Administered 2017-01-19: 10 mL

## 2017-01-19 MED ORDER — CEFAZOLIN SODIUM-DEXTROSE 2-4 GM/100ML-% IV SOLN
2.0000 g | INTRAVENOUS | Status: AC
  Administered 2017-01-19: 2 g via INTRAVENOUS

## 2017-01-19 MED ORDER — LIDOCAINE 2% (20 MG/ML) 5 ML SYRINGE
INTRAMUSCULAR | Status: AC
  Filled 2017-01-19: qty 5

## 2017-01-19 MED ORDER — FENTANYL CITRATE (PF) 100 MCG/2ML IJ SOLN
50.0000 ug | INTRAMUSCULAR | Status: DC | PRN
  Administered 2017-01-19 (×2): 50 ug via INTRAVENOUS

## 2017-01-19 MED ORDER — LIDOCAINE 2% (20 MG/ML) 5 ML SYRINGE
INTRAMUSCULAR | Status: DC | PRN
  Administered 2017-01-19: 40 mg via INTRAVENOUS

## 2017-01-19 MED ORDER — ACETAMINOPHEN 500 MG PO TABS
ORAL_TABLET | ORAL | Status: AC
  Filled 2017-01-19: qty 2

## 2017-01-19 MED ORDER — SCOPOLAMINE 1 MG/3DAYS TD PT72: 1.0000 | MEDICATED_PATCH | Freq: Once | TRANSDERMAL | Status: DC | PRN

## 2017-01-19 MED ORDER — HYDROCODONE-ACETAMINOPHEN 5-325 MG PO TABS: 1.0000 | ORAL_TABLET | Freq: Four times a day (QID) | ORAL | 0 refills | Status: DC | PRN

## 2017-01-19 MED ORDER — FENTANYL CITRATE (PF) 100 MCG/2ML IJ SOLN: 25.0000 ug | INTRAMUSCULAR | Status: DC | PRN

## 2017-01-19 MED ORDER — DEXAMETHASONE SODIUM PHOSPHATE 10 MG/ML IJ SOLN
INTRAMUSCULAR | Status: AC
  Filled 2017-01-19: qty 1

## 2017-01-19 MED ORDER — ONDANSETRON HCL 4 MG/2ML IJ SOLN
INTRAMUSCULAR | Status: DC | PRN
  Administered 2017-01-19: 4 mg via INTRAVENOUS

## 2017-01-19 MED ORDER — ONDANSETRON HCL 4 MG/2ML IJ SOLN
INTRAMUSCULAR | Status: AC
  Filled 2017-01-19: qty 2

## 2017-01-19 MED ORDER — CHLORHEXIDINE GLUCONATE CLOTH 2 % EX PADS: 6.0000 | MEDICATED_PAD | Freq: Once | CUTANEOUS | Status: DC

## 2017-01-19 MED ORDER — PROPOFOL 10 MG/ML IV BOLUS
INTRAVENOUS | Status: DC | PRN
  Administered 2017-01-19: 150 mg via INTRAVENOUS

## 2017-01-19 MED ORDER — OXYCODONE HCL 5 MG/5ML PO SOLN: 5.0000 mg | Freq: Once | ORAL | Status: DC | PRN

## 2017-01-19 MED ORDER — ACETAMINOPHEN 500 MG PO TABS
1000.0000 mg | ORAL_TABLET | ORAL | Status: AC
  Administered 2017-01-19: 1000 mg via ORAL

## 2017-01-19 MED ORDER — ONDANSETRON HCL 4 MG/2ML IJ SOLN: 4.0000 mg | Freq: Four times a day (QID) | INTRAMUSCULAR | Status: DC | PRN

## 2017-01-19 MED ORDER — FENTANYL CITRATE (PF) 100 MCG/2ML IJ SOLN
INTRAMUSCULAR | Status: AC
  Filled 2017-01-19: qty 2

## 2017-01-19 MED ORDER — CEFAZOLIN SODIUM-DEXTROSE 2-4 GM/100ML-% IV SOLN
INTRAVENOUS | Status: AC
  Filled 2017-01-19: qty 100

## 2017-01-19 MED ORDER — LACTATED RINGERS IV SOLN
INTRAVENOUS | Status: DC
  Administered 2017-01-19: 09:00:00 via INTRAVENOUS

## 2017-01-19 MED ORDER — OXYCODONE HCL 5 MG PO TABS: 5.0000 mg | ORAL_TABLET | Freq: Once | ORAL | Status: DC | PRN

## 2017-01-19 MED ORDER — DEXAMETHASONE SODIUM PHOSPHATE 4 MG/ML IJ SOLN
INTRAMUSCULAR | Status: DC | PRN
  Administered 2017-01-19: 10 mg via INTRAVENOUS

## 2017-01-19 MED ORDER — PROPOFOL 500 MG/50ML IV EMUL
INTRAVENOUS | Status: AC
  Filled 2017-01-19: qty 50

## 2017-01-19 MED ORDER — GABAPENTIN 300 MG PO CAPS
ORAL_CAPSULE | ORAL | Status: AC
  Filled 2017-01-19: qty 1

## 2017-01-19 MED ORDER — CELECOXIB 100 MG PO CAPS: 100.0000 mg | ORAL_CAPSULE | ORAL | Status: DC

## 2017-01-19 SURGICAL SUPPLY — 66 items
ADH SKN CLS APL DERMABOND .7 (GAUZE/BANDAGES/DRESSINGS) ×1
APL SKNCLS STERI-STRIP NONHPOA (GAUZE/BANDAGES/DRESSINGS)
APPLIER CLIP 9.375 MED OPEN (MISCELLANEOUS) ×3
APR CLP MED 9.3 20 MLT OPN (MISCELLANEOUS) ×1
BENZOIN TINCTURE PRP APPL 2/3 (GAUZE/BANDAGES/DRESSINGS) IMPLANT
BINDER BREAST LRG (GAUZE/BANDAGES/DRESSINGS) IMPLANT
BINDER BREAST MEDIUM (GAUZE/BANDAGES/DRESSINGS) IMPLANT
BINDER BREAST XLRG (GAUZE/BANDAGES/DRESSINGS) ×2 IMPLANT
BINDER BREAST XXLRG (GAUZE/BANDAGES/DRESSINGS) IMPLANT
BLADE HEX COATED 2.75 (ELECTRODE) ×3 IMPLANT
BLADE SURG 10 STRL SS (BLADE) IMPLANT
BLADE SURG 15 STRL LF DISP TIS (BLADE) ×1 IMPLANT
BLADE SURG 15 STRL SS (BLADE) ×3
CANISTER SUC SOCK COL 7IN (MISCELLANEOUS) IMPLANT
CANISTER SUCT 1200ML W/VALVE (MISCELLANEOUS) ×3 IMPLANT
CHLORAPREP W/TINT 26ML (MISCELLANEOUS) ×3 IMPLANT
CLIP APPLIE 9.375 MED OPEN (MISCELLANEOUS) IMPLANT
CLOSURE WOUND 1/2 X4 (GAUZE/BANDAGES/DRESSINGS)
COVER BACK TABLE 60X90IN (DRAPES) ×3 IMPLANT
COVER MAYO STAND STRL (DRAPES) ×3 IMPLANT
COVER PROBE W GEL 5X96 (DRAPES) ×3 IMPLANT
DECANTER SPIKE VIAL GLASS SM (MISCELLANEOUS) IMPLANT
DERMABOND ADVANCED (GAUZE/BANDAGES/DRESSINGS) ×2
DERMABOND ADVANCED .7 DNX12 (GAUZE/BANDAGES/DRESSINGS) ×1 IMPLANT
DEVICE DUBIN W/COMP PLATE 8390 (MISCELLANEOUS) ×3 IMPLANT
DRAPE LAPAROSCOPIC ABDOMINAL (DRAPES) ×3 IMPLANT
DRAPE UTILITY XL STRL (DRAPES) ×3 IMPLANT
DRSG PAD ABDOMINAL 8X10 ST (GAUZE/BANDAGES/DRESSINGS) ×2 IMPLANT
ELECT REM PT RETURN 9FT ADLT (ELECTROSURGICAL) ×3
ELECTRODE REM PT RTRN 9FT ADLT (ELECTROSURGICAL) ×1 IMPLANT
GAUZE SPONGE 4X4 12PLY STRL LF (GAUZE/BANDAGES/DRESSINGS) IMPLANT
GLOVE BIO SURGEON STRL SZ 6.5 (GLOVE) ×1 IMPLANT
GLOVE BIO SURGEONS STRL SZ 6.5 (GLOVE) ×1
GLOVE EUDERMIC 7 POWDERFREE (GLOVE) ×3 IMPLANT
GLOVE EXAM NITRILE MD LF STRL (GLOVE) ×2 IMPLANT
GOWN STRL REUS W/ TWL LRG LVL3 (GOWN DISPOSABLE) ×1 IMPLANT
GOWN STRL REUS W/ TWL XL LVL3 (GOWN DISPOSABLE) ×1 IMPLANT
GOWN STRL REUS W/TWL LRG LVL3 (GOWN DISPOSABLE) ×3
GOWN STRL REUS W/TWL XL LVL3 (GOWN DISPOSABLE) ×3
ILLUMINATOR WAVEGUIDE N/F (MISCELLANEOUS) IMPLANT
KIT MARKER MARGIN INK (KITS) ×3 IMPLANT
LIGHT WAVEGUIDE WIDE FLAT (MISCELLANEOUS) IMPLANT
NDL HYPO 25X1 1.5 SAFETY (NEEDLE) ×1 IMPLANT
NEEDLE HYPO 25X1 1.5 SAFETY (NEEDLE) ×3 IMPLANT
NS IRRIG 1000ML POUR BTL (IV SOLUTION) ×3 IMPLANT
PACK BASIN DAY SURGERY FS (CUSTOM PROCEDURE TRAY) ×3 IMPLANT
PENCIL BUTTON HOLSTER BLD 10FT (ELECTRODE) ×3 IMPLANT
SHEET MEDIUM DRAPE 40X70 STRL (DRAPES) IMPLANT
SLEEVE SCD COMPRESS KNEE MED (MISCELLANEOUS) ×3 IMPLANT
SPONGE LAP 18X18 X RAY DECT (DISPOSABLE) IMPLANT
SPONGE LAP 4X18 X RAY DECT (DISPOSABLE) ×3 IMPLANT
STRIP CLOSURE SKIN 1/2X4 (GAUZE/BANDAGES/DRESSINGS) IMPLANT
SUT ETHILON 3 0 FSL (SUTURE) IMPLANT
SUT MNCRL AB 4-0 PS2 18 (SUTURE) ×3 IMPLANT
SUT SILK 2 0 SH (SUTURE) ×3 IMPLANT
SUT VIC AB 2-0 CT1 27 (SUTURE)
SUT VIC AB 2-0 CT1 TAPERPNT 27 (SUTURE) IMPLANT
SUT VIC AB 3-0 SH 27 (SUTURE)
SUT VIC AB 3-0 SH 27X BRD (SUTURE) IMPLANT
SUT VICRYL 3-0 CR8 SH (SUTURE) ×3 IMPLANT
SYR 10ML LL (SYRINGE) ×3 IMPLANT
TOWEL OR 17X24 6PK STRL BLUE (TOWEL DISPOSABLE) ×3 IMPLANT
TOWEL OR NON WOVEN STRL DISP B (DISPOSABLE) IMPLANT
TUBE CONNECTING 20'X1/4 (TUBING) ×1
TUBE CONNECTING 20X1/4 (TUBING) ×2 IMPLANT
YANKAUER SUCT BULB TIP NO VENT (SUCTIONS) ×3 IMPLANT

## 2017-01-19 NOTE — Anesthesia Postprocedure Evaluation (Signed)
Anesthesia Post Note  Patient: Alexandria Randolph  Procedure(s) Performed: RIGHT BREAST LUMPECTOMY WITH RADIOACTIVE SEED LOCALIZATION (Right Breast)     Patient location during evaluation: PACU Anesthesia Type: General Level of consciousness: awake and alert Pain management: pain level controlled Vital Signs Assessment: post-procedure vital signs reviewed and stable Respiratory status: spontaneous breathing, nonlabored ventilation, respiratory function stable and patient connected to nasal cannula oxygen Cardiovascular status: blood pressure returned to baseline and stable Postop Assessment: no apparent nausea or vomiting Anesthetic complications: no    Last Vitals:  Vitals:   01/19/17 1204 01/19/17 1210  BP: (!) 165/75 (!) 160/75  Pulse: 89   Resp: 20   Temp: 36.6 C   SpO2: 95%     Last Pain:  Vitals:   01/19/17 1152  TempSrc:   PainSc: 0-No pain                 Johneisha Broaden S

## 2017-01-19 NOTE — Transfer of Care (Signed)
Immediate Anesthesia Transfer of Care Note  Patient: Alexandria Randolph  Procedure(s) Performed: RIGHT BREAST LUMPECTOMY WITH RADIOACTIVE SEED LOCALIZATION (Right Breast)  Patient Location: PACU  Anesthesia Type:General  Level of Consciousness: awake and sedated  Airway & Oxygen Therapy: Patient Spontanous Breathing and Patient connected to face mask oxygen  Post-op Assessment: Report given to RN and Post -op Vital signs reviewed and stable  Post vital signs: Reviewed and stable  Last Vitals:  Vitals:   01/19/17 0840 01/19/17 1124  BP: (!) 152/83   Pulse: 92 98  Resp: 20 (!) 28  Temp: 36.6 C   SpO2: 100% 100%    Last Pain:  Vitals:   01/19/17 0840  TempSrc: Oral         Complications: No apparent anesthesia complications

## 2017-01-19 NOTE — Anesthesia Procedure Notes (Signed)
Procedure Name: LMA Insertion Performed by: Demri Poulton W Pre-anesthesia Checklist: Patient identified, Emergency Drugs available, Suction available and Patient being monitored Patient Re-evaluated:Patient Re-evaluated prior to induction Oxygen Delivery Method: Circle system utilized Preoxygenation: Pre-oxygenation with 100% oxygen Induction Type: IV induction Ventilation: Mask ventilation without difficulty LMA: LMA inserted LMA Size: 4.0 Number of attempts: 1 Placement Confirmation: positive ETCO2 Tube secured with: Tape Dental Injury: Teeth and Oropharynx as per pre-operative assessment        

## 2017-01-19 NOTE — Op Note (Signed)
Patient Name:           Alexandria Randolph   Date of Surgery:        01/19/2017  Pre op Diagnosis:      Invasive ductal carcinoma right breast, estrogen receptor positive, lower outer quadrant  Post op Diagnosis:    Same  Procedure:                 Right breast lumpectomy with radioactive seed localization  Surgeon:                     Edsel Petrin. Dalbert Batman, M.D., FACS  Assistant:                      OR staff   Indication for Assistant: N/A  Operative Indications:   This is an 82 year old female seen in the [place] recently. She is referred by Dr. Isaiah Blakes at [facility] imaging for evaluation and management of a small invasive cancer in the right breast laterally. Dr. Renato Shin is her PCP. Dr. Lindi Adie and Dr. Sondra Come are involved her cancer care.      She has no prior breast problems. Get screening mammography. Recent imaging shows a 7 mm mass in the right breast at the 9 o'clock position. Core biopsy shows invasive ductal carcinoma mixed with DCIS. ER 100%. PR 100%. Ki-67 15%. HER-2 negative.     Family history reveals mother died of throat and colon cancer. Father had diabetes. A paternal aunt had breast cancer age 53      We had a long talk. We talked about clinical stage of her disease. We talked about lumpectomy and mastectomy. We talked about lymph node biopsies. None of the treating physicians feel that she would benefit from sentinel node biopsy as it would not influence decisions regarding chemotherapy or radiation therapy and she is clinically node negative. She is strongly motivated for breast conservation and I think she is an excellent candidate for that. She will be scheduled for right breast lumpectomy with radioactive seed localization.Marland Kitchen  Operative Findings:       The marker clip and radioactive seed marking the tumor were in the lower outer quadrant of the right breast.  The specimen mammogram looked very good with the marker clip and the seed in the center of the  specimen.  Grossly I felt that I had widely negative margins.  Procedure in Detail:          Following the induction of general LMA anesthesia the patient's right breast was prepped and draped in a sterile fashion.  Surgical timeout was performed.  Intravenous antibiotics were given.  0.5% Marcaine with epinephrine was used as local infiltration anesthetic.   Using the neoprobe I isolated a focal area of radioactivity in the lower outer right breast.  I made a curvilinear incision in the skin lines.  The lumpectomy was performed using the neoprobe and electrocautery.  The specimen was removed and marked with silk sutures and a 6 color ink kit to orient the pathologist.  The specimen mammogram looked very good as described above.  The specimen was marked and sent to the pathology lab.  Hemostasis was excellent and was achieved with electrocautery.  The wound was irrigated.  The walls of the lumpectomy cavity were marked with metal clips.  The breast tissues were closed in 2 separate layers with interrupted  3-0 Vicryl.  The skin was closed with a running subcuticular 4-0 Monocryl and Dermabond.  Breast binder was placed and the patient taken to PACU in stable condition.  EBL 10 mL.  Counts correct.  Complications none.       Edsel Petrin. Dalbert Batman, M.D., FACS General and Minimally Invasive Surgery Breast and Colorectal Surgery    Addendum: I logged onto the [facility]  website and reviewed her prescription medication history.    01/19/2017 11:11 AM

## 2017-01-19 NOTE — Interval H&P Note (Signed)
History and Physical Interval Note:  01/19/2017 8:25 AM  Alexandria Randolph  has presented today for surgery, with the diagnosis of RIGHT BREAST CANCER  The various methods of treatment have been discussed with the patient and family. After consideration of risks, benefits and other options for treatment, the patient has consented to  Procedure(s): BREAST LUMPECTOMY WITH RADIOACTIVE SEED LOCALIZATION (Right) as a surgical intervention .  The patient's history has been reviewed, patient examined, no change in status, stable for surgery.  I have reviewed the patient's chart and labs.  Questions were answered to the patient's satisfaction.     Adin Hector

## 2017-01-19 NOTE — Discharge Instructions (Signed)
[facility] °Office Phone Number [phone] ° °BREAST BIOPSY/ LUMPECTOMY: POST OP INSTRUCTIONS ° °Always review your discharge instruction sheet given to you by the facility where your surgery was performed. ° °IF YOU HAVE DISABILITY OR FAMILY LEAVE FORMS, YOU MUST BRING THEM TO THE OFFICE FOR PROCESSING.  DO NOT GIVE THEM TO YOUR DOCTOR. ° °1. A prescription for pain medication may be given to you upon discharge.  Take your pain medication as prescribed, if needed.  If narcotic pain medicine is not needed, then you may take acetaminophen (Tylenol) or ibuprofen (Advil) as needed. °2. Take your usually prescribed medications unless otherwise directed °3. If you need a refill on your pain medication, please contact your pharmacy.  They will contact our office to request authorization.  Prescriptions will not be filled after 5pm or on week-ends. °4. You should eat very light the first 24 hours after surgery, such as soup, crackers, pudding, etc.  Resume your normal diet the day after surgery. °5. Most patients will experience some swelling and bruising in the breast.  Ice packs and a good support bra will help.  Swelling and bruising can take several days to resolve.  °6. It is common to experience some constipation if taking pain medication after surgery.  Increasing fluid intake and taking a stool softener will usually help or prevent this problem from occurring.  A mild laxative (Milk of Magnesia or Miralax) should be taken according to package directions if there are no bowel movements after 48 hours. °7. Unless discharge instructions indicate otherwise, you may remove your bandages 24-48 hours after surgery, and you may shower at that time.  You may have steri-strips (small skin tapes) in place directly over the incision.  These strips should be left on the skin for 7-10 days.  If your surgeon used skin glue on the incision, you may shower in 24 hours.  The glue will flake off over the next 2-3  weeks.  Any sutures or staples will be removed at the office during your follow-up visit. °8. ACTIVITIES:  You may resume regular daily activities (gradually increasing) beginning the next day.  Wearing a good support bra or sports bra minimizes pain and swelling.  You may have sexual intercourse when it is comfortable. °a. You may drive when you no longer are taking prescription pain medication, you can comfortably wear a seatbelt, and you can safely maneuver your car and apply brakes. °b. RETURN TO WORK:  ______________________________________________________________________________________ °9. You should see your doctor in the office for a follow-up appointment approximately two weeks after your surgery.  Your doctor’s nurse will typically make your follow-up appointment when she calls you with your pathology report.  Expect your pathology report 2-3 business days after your surgery.  You may call to check if you do not hear from us after three days. °10. OTHER INSTRUCTIONS: _______________________________________________________________________________________________ _____________________________________________________________________________________________________________________________________ °_____________________________________________________________________________________________________________________________________ °_____________________________________________________________________________________________________________________________________ ° °WHEN TO CALL YOUR DOCTOR: °1. Fever over 101.0 °2. Nausea and/or vomiting. °3. Extreme swelling or bruising. °4. Continued bleeding from incision. °5. Increased pain, redness, or drainage from the incision. ° °The clinic staff is available to answer your questions during regular business hours.  Please don’t hesitate to call and ask to speak to one of the nurses for clinical concerns.  If you have a medical emergency, go to the nearest emergency  room or call 911.  A surgeon from [facility] is always on call at the hospital. ° °For further questions, please visit [email or web address]  ° °  Post Anesthesia Home Care Instructions ° °Activity: °Get plenty of rest for the remainder of the day. A responsible individual must stay with you for 24 hours following the procedure.  °For the next 24 hours, DO NOT: °-Drive a car °-Operate machinery °-Drink alcoholic beverages °-Take any medication unless instructed by your physician °-Make any legal decisions or sign important papers. ° °Meals: °Start with liquid foods such as gelatin or soup. Progress to regular foods as tolerated. Avoid greasy, spicy, heavy foods. If nausea and/or vomiting occur, drink only clear liquids until the nausea and/or vomiting subsides. Call your physician if vomiting continues. ° °Special Instructions/Symptoms: °Your throat may feel dry or sore from the anesthesia or the breathing tube placed in your throat during surgery. If this causes discomfort, gargle with warm salt water. The discomfort should disappear within 24 hours. ° °If you had a scopolamine patch placed behind your ear for the management of post- operative nausea and/or vomiting: ° °1. The medication in the patch is effective for 72 hours, after which it should be removed.  Wrap patch in a tissue and discard in the trash. Wash hands thoroughly with soap and water. °2. You may remove the patch earlier than 72 hours if you experience unpleasant side effects which may include dry mouth, dizziness or visual disturbances. °3. Avoid touching the patch. Wash your hands with soap and water after contact with the patch. °  ° ° °

## 2017-01-19 NOTE — Anesthesia Preprocedure Evaluation (Signed)
Anesthesia Evaluation  Patient identified by MRN, date of birth, ID band Patient awake    Reviewed: Allergy & Precautions, H&P , NPO status , Patient's Chart, lab work & pertinent test results  Airway Mallampati: II   Neck ROM: full    Dental   Pulmonary asthma , former smoker,    breath sounds clear to auscultation       Cardiovascular hypertension,  Rhythm:regular Rate:Normal     Neuro/Psych    GI/Hepatic   Endo/Other    Renal/GU      Musculoskeletal  (+) Arthritis ,   Abdominal   Peds  Hematology   Anesthesia Other Findings   Reproductive/Obstetrics                             Anesthesia Physical Anesthesia Plan  ASA: II  Anesthesia Plan: General   Post-op Pain Management:    Induction: Intravenous  PONV Risk Score and Plan: 3 and Ondansetron, Dexamethasone, Midazolam and Treatment may vary due to age or medical condition  Airway Management Planned: LMA  Additional Equipment:   Intra-op Plan:   Post-operative Plan:   Informed Consent: I have reviewed the patients History and Physical, chart, labs and discussed the procedure including the risks, benefits and alternatives for the proposed anesthesia with the patient or authorized representative who has indicated his/her understanding and acceptance.     Plan Discussed with: CRNA, Anesthesiologist and Surgeon  Anesthesia Plan Comments:         Anesthesia Quick Evaluation

## 2017-01-22 ENCOUNTER — Encounter (HOSPITAL_BASED_OUTPATIENT_CLINIC_OR_DEPARTMENT_OTHER): Payer: Self-pay | Admitting: General Surgery

## 2017-01-23 NOTE — Progress Notes (Signed)
Inform patient of Pathology report. Tell her that her breast cancer was 1 cm diameter.  Margins are negative and she will not need any further surgery.  I will discuss this with her in detail at her next office visit. surgery.  This is good news. That may know that you reached her.   hmi

## 2017-01-30 ENCOUNTER — Ambulatory Visit (HOSPITAL_BASED_OUTPATIENT_CLINIC_OR_DEPARTMENT_OTHER): Payer: Medicare Other | Admitting: Hematology and Oncology

## 2017-01-30 ENCOUNTER — Telehealth: Payer: Self-pay | Admitting: Hematology and Oncology

## 2017-01-30 DIAGNOSIS — Z170 Estrogen receptor positive status [ER+]: Secondary | ICD-10-CM

## 2017-01-30 DIAGNOSIS — C50411 Malignant neoplasm of upper-outer quadrant of right female breast: Secondary | ICD-10-CM | POA: Diagnosis not present

## 2017-01-30 MED ORDER — ANASTROZOLE 1 MG PO TABS: 1.0000 mg | ORAL_TABLET | Freq: Every day | ORAL | 3 refills | Status: DC

## 2017-01-30 NOTE — Progress Notes (Signed)
Patient Care Team: Renato Shin, MD as PCP - Scotty Court, MD (Ophthalmology)  DIAGNOSIS:  Encounter Diagnosis  Name Primary?  . Malignant neoplasm of upper-outer quadrant of right breast in female, estrogen receptor positive ([place])     SUMMARY OF ONCOLOGIC HISTORY:   Malignant neoplasm of upper-outer quadrant of right breast in female, estrogen receptor positive ([place])   12/27/2016 Initial Diagnosis    Screening detected right breast mass 7 mm at 9o clock position, axilla negative, biopsy: IDC grade 2 with DCIS ER 100%, PR 100%, Ki-67 15%,HER-2 negative ratio 1.48 T1b N0 stage IA clinical stage      01/19/2017 Surgery    Right lumpectomy: IDC grade 2, 1 cm, with high-grade DCIS, margins negative, ER 100%, PR 100%, HER-2 negative, Ki-67 15%, T1BNX stage I a       CHIEF COMPLIANT: Follow-up after lumpectomy to discuss pathology report  INTERVAL HISTORY: Alexandria Randolph is a 82 year old with above-mentioned history of right breast cancer underwent lumpectomy on 01/19/2017.  Final pathology came back as 1 cm sized invasive ductal carcinoma with DCIS that was ER PR positive HER-2 negative as previously noted.  She is healing very well from the surgery.  She reports no new complaints or concerns.  REVIEW OF SYSTEMS:   Constitutional: Denies fevers, chills or abnormal weight loss Eyes: Denies blurriness of vision Ears, nose, mouth, throat, and face: Denies mucositis or sore throat Respiratory: Denies cough, dyspnea or wheezes Cardiovascular: Denies palpitation, chest discomfort Gastrointestinal:  Denies nausea, heartburn or change in bowel habits Skin: Denies abnormal skin rashes Lymphatics: Denies new lymphadenopathy or easy bruising Neurological:Denies numbness, tingling or new weaknesses Behavioral/Psych: Mood is stable, no new changes  Extremities: No lower extremity edema Breast:  denies any pain or lumps or nodules in either breasts All other systems were reviewed  with the patient and are negative.  I have reviewed the past medical history, past surgical history, social history and family history with the patient and they are unchanged from previous note.  ALLERGIES:  is allergic to losartan potassium; olmesartan medoxomil; and simvastatin.  MEDICATIONS:  Current Outpatient Medications  Medication Sig Dispense Refill  . anastrozole (ARIMIDEX) 1 MG tablet Take 1 tablet (1 mg total) daily by mouth. 90 tablet 3  . cetirizine (ZYRTEC) 10 MG tablet Take 10 mg by mouth daily.    . hydrochlorothiazide (HYDRODIURIL) 12.5 MG tablet Take 1 tablet (12.5 mg total) by mouth daily. 90 tablet 3   No current facility-administered medications for this visit.     PHYSICAL EXAMINATION: ECOG PERFORMANCE STATUS: 1 - Symptomatic but completely ambulatory  Vitals:   01/30/17 0830  BP: (!) 162/82  Pulse: 96  Resp: 20  Temp: 98.2 F (36.8 C)  SpO2: 100%   Filed Weights   01/30/17 0830  Weight: 159 lb (72.1 kg)    GENERAL:alert, no distress and comfortable SKIN: skin color, texture, turgor are normal, no rashes or significant lesions EYES: normal, Conjunctiva are pink and non-injected, sclera clear OROPHARYNX:no exudate, no erythema and lips, buccal mucosa, and tongue normal  NECK: supple, thyroid normal size, non-tender, without nodularity LYMPH:  no palpable lymphadenopathy in the cervical, axillary or inguinal LUNGS: clear to auscultation and percussion with normal breathing effort HEART: regular rate & rhythm and no murmurs and no lower extremity edema ABDOMEN:abdomen soft, non-tender and normal bowel sounds MUSCULOSKELETAL:no cyanosis of digits and no clubbing  NEURO: alert & oriented x 3 with fluent speech, no focal motor/sensory deficits EXTREMITIES: No lower  extremity edema  LABORATORY DATA:  I have reviewed the data as listed   Chemistry      Component Value Date/Time   NA 143 01/03/2017 0849   K 3.5 01/03/2017 0849   CL 102 11/01/2014 1440    CO2 21 (L) 01/03/2017 0849   BUN 8.4 01/03/2017 0849   CREATININE 0.9 01/03/2017 0849      Component Value Date/Time   CALCIUM 9.6 01/03/2017 0849   ALKPHOS 101 01/03/2017 0849   AST 13 01/03/2017 0849   ALT 11 01/03/2017 0849   BILITOT 0.88 01/03/2017 0849       Lab Results  Component Value Date   WBC 4.6 01/03/2017   HGB 14.3 01/03/2017   HCT 44.5 01/03/2017   MCV 92.7 01/03/2017   PLT 287 01/03/2017   NEUTROABS 3.2 01/03/2017    ASSESSMENT & PLAN:  Malignant neoplasm of upper-outer quadrant of right breast in female, estrogen receptor positive ([place]) 01/19/2017 right lumpectomy: IDC grade 2, 1 cm, with high-grade DCIS, margins negative, ER 100%, PR 100%, HER-2 negative, Ki-67 15%, T1BNX stage I a  Pathology counseling: I discussed the final pathology report of the patient provided  a copy of this report. I discussed the margins as well as lymph node surgeries. We also discussed the final staging along with previously performed ER/PR and HER-2/neu testing.  Recommendation: 1.  Radiation oncology determined that she does not need adjuvant radiation primarily because of her elderly age. 2. Adjuvant antiestrogen therapy with anastrozole 1 mg daily X 5 years, this will start in December.  Return to clinic in 3-4 months for survivorship care plan visit   I spent 25 minutes talking to the patient of which more than half was spent in counseling and coordination of care.  No orders of the defined types were placed in this encounter.  The patient has a good understanding of the overall plan. she agrees with it. she will call with any problems that may develop before the next visit here.   Rulon Eisenmenger, MD 01/30/17

## 2017-01-30 NOTE — Telephone Encounter (Signed)
Gave patient AVS and calendar of upcoming march appointments.

## 2017-01-30 NOTE — Assessment & Plan Note (Signed)
01/19/2017 right lumpectomy: IDC grade 2, 1 cm, with high-grade DCIS, margins negative, ER 100%, PR 100%, HER-2 negative, Ki-67 15%, T1BNX stage I a  Pathology counseling: I discussed the final pathology report of the patient provided  a copy of this report. I discussed the margins as well as lymph node surgeries. We also discussed the final staging along with previously performed ER/PR and HER-2/neu testing.  Recommendation: 1. +/-Adjuvant radiation 2. Adjuvant antiestrogen therapy with anastrozole 1 mg daily X 5 years  Return to clinic after radiation is complete

## 2017-05-23 ENCOUNTER — Telehealth: Payer: Self-pay

## 2017-05-23 NOTE — Telephone Encounter (Signed)
Called and left message reminding patient of appt 3/12 at 1000.

## 2017-05-24 DIAGNOSIS — H402231 Chronic angle-closure glaucoma, bilateral, mild stage: Secondary | ICD-10-CM | POA: Diagnosis not present

## 2017-05-24 DIAGNOSIS — H524 Presbyopia: Secondary | ICD-10-CM | POA: Diagnosis not present

## 2017-05-28 ENCOUNTER — Telehealth: Payer: Self-pay | Admitting: Hematology and Oncology

## 2017-05-28 NOTE — Telephone Encounter (Signed)
Spoke with patient and confirmed appt - returned patients phone call.

## 2017-05-29 ENCOUNTER — Inpatient Hospital Stay: Payer: Medicare Other | Attending: Adult Health | Admitting: Adult Health

## 2017-05-29 ENCOUNTER — Encounter: Payer: Self-pay | Admitting: Adult Health

## 2017-05-29 ENCOUNTER — Telehealth: Payer: Self-pay | Admitting: Adult Health

## 2017-05-29 VITALS — BP 158/73 | HR 77 | Temp 98.7°F | Resp 17 | Ht <= 58 in | Wt 162.2 lb

## 2017-05-29 DIAGNOSIS — C50411 Malignant neoplasm of upper-outer quadrant of right female breast: Secondary | ICD-10-CM

## 2017-05-29 DIAGNOSIS — Z170 Estrogen receptor positive status [ER+]: Secondary | ICD-10-CM | POA: Diagnosis not present

## 2017-05-29 DIAGNOSIS — Z79811 Long term (current) use of aromatase inhibitors: Secondary | ICD-10-CM

## 2017-05-29 DIAGNOSIS — M8580 Other specified disorders of bone density and structure, unspecified site: Secondary | ICD-10-CM | POA: Diagnosis not present

## 2017-05-29 DIAGNOSIS — E2839 Other primary ovarian failure: Secondary | ICD-10-CM

## 2017-05-29 NOTE — Telephone Encounter (Signed)
Gave patient AVs and calendar of upcoming October appointments.  °

## 2017-05-29 NOTE — Progress Notes (Signed)
CLINIC:  Survivorship   REASON FOR VISIT:  Routine follow-up post-treatment for a recent history of breast cancer.  BRIEF ONCOLOGIC HISTORY:    Malignant neoplasm of upper-outer quadrant of right breast in female, estrogen receptor positive ([place])   12/27/2016 Initial Diagnosis    Screening detected right breast mass 7 mm at 9o clock position, axilla negative, biopsy: IDC grade 2 with DCIS ER 100%, PR 100%, Ki-67 15%,HER-2 negative ratio 1.48 T1b N0 stage IA clinical stage      01/19/2017 Surgery    Right lumpectomy: IDC grade 2, 1 cm, with high-grade DCIS, margins negative, ER 100%, PR 100%, HER-2 negative, Ki-67 15%, T1BNX stage I a      02/2017 -  Anti-estrogen oral therapy    Anastrozole daily       INTERVAL HISTORY:  Alexandria Randolph presents to the [facility] today for our initial meeting to review her survivorship care plan detailing her treatment course for breast cancer, as well as monitoring long-term side effects of that treatment, education regarding health maintenance, screening, and overall wellness and health promotion.     Overall, Alexandria Randolph reports feeling quite well.  She is taking the Anastrozole daily.  She is tolerating it well.  She does have hot flashes.  These happen one to two times per week and are not particularly bothersome.      REVIEW OF SYSTEMS:  Review of Systems  Constitutional: Negative for appetite change, chills, fatigue and fever.  HENT:   Negative for hearing loss, lump/mass and trouble swallowing.   Eyes: Negative for eye problems and icterus.  Respiratory: Negative for chest tightness, cough and shortness of breath.   Cardiovascular: Negative for chest pain, leg swelling and palpitations.  Gastrointestinal: Negative for abdominal distention, abdominal pain, blood in stool, constipation, diarrhea, nausea and vomiting.  Endocrine: Positive for hot flashes.  Musculoskeletal: Negative for arthralgias.  Skin: Negative for itching and  rash.  Neurological: Negative for dizziness and headaches.  Hematological: Negative for adenopathy. Does not bruise/bleed easily.  Psychiatric/Behavioral: Negative for depression. The patient is not nervous/anxious.   Breast: Denies any new nodularity, masses, tenderness, nipple changes, or nipple discharge.      ONCOLOGY TREATMENT TEAM:  1. Surgeon:  Dr. Dalbert Batman at [facility] 2. Medical Oncologist: Dr. Lindi Adie      PAST MEDICAL/SURGICAL HISTORY:  Past Medical History:  Diagnosis Date  . Allergy    SEASONAL  . Cataracts, bilateral   . DYSLIPIDEMIA 12/01/2006  . Environmental allergies    has had allergy test:grass,trees,plants,dust  . HYPERGLYCEMIA 12/01/2006  . HYPERTENSION 12/01/2006  . OSTEOPOROSIS 12/01/2006  . SMOKER 12/01/2006   Past Surgical History:  Procedure Laterality Date  . ABDOMINAL HYSTERECTOMY    . BREAST LUMPECTOMY WITH RADIOACTIVE SEED LOCALIZATION Right 01/19/2017   Procedure: RIGHT BREAST LUMPECTOMY WITH RADIOACTIVE SEED LOCALIZATION;  Surgeon: Fanny Skates, MD;  Location: [facility];  Service: General;  Laterality: Right;  . COLONOSCOPY    . POLYPECTOMY    . TOTAL ABDOMINAL HYSTERECTOMY W/ BILATERAL SALPINGOOPHORECTOMY  1980     ALLERGIES:  Allergies  Allergen Reactions  . Losartan Potassium     REACTION: sob  . Olmesartan Medoxomil     REACTION: sob  . Simvastatin     REACTION: Malgias     CURRENT MEDICATIONS:  Outpatient Encounter Medications as of 05/29/2017  Medication Sig  . anastrozole (ARIMIDEX) 1 MG tablet Take 1 tablet (1 mg total) daily by mouth.  . Calcium Citrate-Vitamin  D (CITRACAL + D PO) Take by mouth daily.  . cetirizine (ZYRTEC) 10 MG tablet Take 10 mg by mouth daily.  . hydrochlorothiazide (HYDRODIURIL) 12.5 MG tablet Take 1 tablet (12.5 mg total) by mouth daily.   No facility-administered encounter medications on file as of 05/29/2017.      ONCOLOGIC FAMILY HISTORY:  Family History  Problem  Relation Age of Onset  . Cancer Mother        "Throat" and colon cancer, both at an advanced age  . Diabetes Father   . Breast cancer Paternal Aunt   . Colon cancer Neg Hx      GENETIC COUNSELING/TESTING: Not at this time  SOCIAL HISTORY:  Alexandria Randolph is single and lives alone in [place], [place].  She does not have any children.  Ms. Kennebrew is currently retired.  She denies any current or history of tobacco, alcohol, or illicit drug use.     PHYSICAL EXAMINATION:  Vital Signs:   Vitals:   05/29/17 0957  BP: (!) 158/73  Pulse: 77  Resp: 17  Temp: 98.7 F (37.1 C)  SpO2: 100%   Filed Weights   05/29/17 0957  Weight: 162 lb 3.2 oz (73.6 kg)   General: Well-nourished, well-appearing female in no acute distress.  She is unaccompanied today.   HEENT: Head is normocephalic.  Pupils equal and reactive to light. Conjunctivae clear without exudate.  Sclerae anicteric. Oral mucosa is pink, moist.  Oropharynx is pink without lesions or erythema.  Lymph: No cervical, supraclavicular, or infraclavicular lymphadenopathy noted on palpation.  Cardiovascular: Regular rate and rhythm.Marland Kitchen Respiratory: Clear to auscultation bilaterally. Chest expansion symmetric; breathing non-labored.  Breasts: right breast s/p lumpectomy.  No nodularity, nodules, masses noted, left breast without nodules, masses, skin/nipple changes.  Benign bilateral breast cancer. GI: Abdomen soft and round; non-tender, non-distended. Bowel sounds normoactive.  GU: Deferred.  Neuro: No focal deficits. Steady gait.  Psych: Mood and affect normal and appropriate for situation.  Extremities: No edema. MSK: No focal spinal tenderness to palpation.  Full range of motion in bilateral upper extremities Skin: Warm and dry.  LABORATORY DATA:  None for this visit.  DIAGNOSTIC IMAGING:  None for this visit.      ASSESSMENT AND PLAN:  [name] is a pleasant 83 y.o. female with Stage IA right breast invasive  ductal carcinoma, ER+/PR+/HER2-, diagnosed in 12/2016, treated with lumpectomy, and anti-estrogen therapy with Anastrozole beginning in 02/2017.  She presents to the Survivorship Clinic for our initial meeting and routine follow-up post-completion of treatment for breast cancer.    1. Stage IA right breast cancer:  Alexandria Randolph is continuing to recover from definitive treatment for breast cancer. She will follow-up with her medical oncologist, Dr. Lindi Adie in 6 months with history and physical exam per surveillance protocol.  She will continue her anti-estrogen therapy with Anastrozole. Thus far, she is tolerating the Anastrozole well, with minimal side effects. She was instructed to make Dr. Lindi Adie or myself aware if she begins to experience any worsening side effects of the medication and I could see her back in clinic to help manage those side effects, as needed.  Today, a comprehensive survivorship care plan and treatment summary was reviewed with the patient today detailing her breast cancer diagnosis, treatment course, potential late/long-term effects of treatment, appropriate follow-up care with recommendations for the future, and patient education resources.  A copy of this summary, along with a letter will be sent to the patient's primary care provider  via mail/fax/In Basket message after today's visit.    2. Bone health:  Given Ms. Harmon's age/history of breast cancer and her current treatment regimen including anti-estrogen therapy with Anastrozole, she is at risk for bone demineralization.  Her last DEXA scan was 2015, which showed osteopenia with a T score of -1.1 in the femur.  In the meantime, she was encouraged to increase her consumption of foods rich in calcium, as well as increase her weight-bearing activities.  She was given education on specific activities to promote bone health.  3. Cancer screening:  Due to Ms. Bardon's history and her age, she should receive screening for skin cancers, colon  cancer, and gynecologic cancers.  The information and recommendations are listed on the patient's comprehensive care plan/treatment summary and were reviewed in detail with the patient.    4. Health maintenance and wellness promotion: Ms. Giddens was encouraged to consume 5-7 servings of fruits and vegetables per day. We reviewed the "Nutrition Rainbow" handout, as well as the handout "Take Control of Your Health and Reduce Your Cancer Risk" from the [facility].  She was also encouraged to engage in moderate to vigorous exercise for 30 minutes per day most days of the week. We discussed the [employer] fitness program, which is designed for cancer survivors to help them become more physically fit after cancer treatments.  She was instructed to limit her alcohol consumption and continue to abstain from tobacco use.     5. Support services/counseling: It is not uncommon for this period of the patient's cancer care trajectory to be one of many emotions and stressors.  We discussed an opportunity for her to participate in the next session of [facility] ("Finding Your New Normal") support group series designed for patients after they have completed treatment.   Ms. Myhand was encouraged to take advantage of our many other support services programs, support groups, and/or counseling in coping with her new life as a cancer survivor after completing anti-cancer treatment.  She was offered support today through active listening and expressive supportive counseling.  She was given information regarding our available services and encouraged to contact me with any questions or for help enrolling in any of our support group/programs.    Dispo:   -Return to cancer center in 6 months for follow up with Dr. Lindi Adie -Mammogram due in 11/2017 -Bone Density in 11/2017 with mammogram -Follow up with surgery within the month -She is welcome to return back to the Survivorship Clinic at any time; no additional follow-up  needed at this time.  -Consider referral back to survivorship as a long-term survivor for continued surveillance  A total of (30) minutes of face-to-face time was spent with this patient with greater than 50% of that time in counseling and care-coordination.   Gardenia Phlegm, [place] [phone]   Note: PRIMARY CARE PROVIDER Renato Shin, [place] [phone]

## 2017-06-13 DIAGNOSIS — H26492 Other secondary cataract, left eye: Secondary | ICD-10-CM | POA: Diagnosis not present

## 2017-07-03 ENCOUNTER — Encounter: Payer: Self-pay | Admitting: Endocrinology

## 2017-07-03 ENCOUNTER — Ambulatory Visit
Admission: RE | Admit: 2017-07-03 | Discharge: 2017-07-03 | Disposition: A | Discharge status: Home / Self Care | Payer: Medicare Other | Source: Ambulatory Visit | Attending: Endocrinology | Admitting: Endocrinology

## 2017-07-03 ENCOUNTER — Ambulatory Visit (INDEPENDENT_AMBULATORY_CARE_PROVIDER_SITE_OTHER): Payer: Medicare Other | Admitting: Endocrinology

## 2017-07-03 VITALS — BP 138/78 | HR 72 | Resp 16 | Wt 165.1 lb

## 2017-07-03 DIAGNOSIS — I10 Essential (primary) hypertension: Secondary | ICD-10-CM

## 2017-07-03 DIAGNOSIS — R739 Hyperglycemia, unspecified: Secondary | ICD-10-CM

## 2017-07-03 DIAGNOSIS — M810 Age-related osteoporosis without current pathological fracture: Secondary | ICD-10-CM

## 2017-07-03 DIAGNOSIS — E785 Hyperlipidemia, unspecified: Secondary | ICD-10-CM | POA: Diagnosis not present

## 2017-07-03 LAB — POCT GLYCOSYLATED HEMOGLOBIN (HGB A1C): Hemoglobin A1C: 5.5

## 2017-07-03 NOTE — Patient Instructions (Addendum)
blood tests are requested for you today.  We'll let you know about the results. Please continue the same medications.  Please come back for a follow-up appointment in 6 months (after 01/02/17).

## 2017-07-03 NOTE — Progress Notes (Signed)
Subjective:    Patient ID: Alexandria Randolph, female    DOB: May 22, 1934, 83 y.o.   MRN: [id number]  HPI The state of at least three ongoing medical problems is addressed today, with interval history of each noted here: Hyperglycemia: she denies weight change.  This is a stable problem.  Dyslipidemia: no chest pain.  This is a stable problem.  HTN: she denies edema.  This is a stable problem.   Osteoporosis: she denies falls.  This is a stable problem. Past Medical History:  Diagnosis Date  . Allergy    SEASONAL  . Cataracts, bilateral   . DYSLIPIDEMIA 12/01/2006  . Environmental allergies    has had allergy test:grass,trees,plants,dust  . HYPERGLYCEMIA 12/01/2006  . HYPERTENSION 12/01/2006  . OSTEOPOROSIS 12/01/2006  . SMOKER 12/01/2006    Past Surgical History:  Procedure Laterality Date  . ABDOMINAL HYSTERECTOMY    . BREAST LUMPECTOMY WITH RADIOACTIVE SEED LOCALIZATION Right 01/19/2017   Procedure: RIGHT BREAST LUMPECTOMY WITH RADIOACTIVE SEED LOCALIZATION;  Surgeon: Fanny Skates, MD;  Location: [facility];  Service: General;  Laterality: Right;  . COLONOSCOPY    . POLYPECTOMY    . TOTAL ABDOMINAL HYSTERECTOMY W/ BILATERAL SALPINGOOPHORECTOMY  1980    Social History   Socioeconomic History  . Marital status: Single    Spouse name: Not on file  . Number of children: Not on file  . Years of education: Not on file  . Highest education level: Not on file  Occupational History  . Occupation: Retired  Scientific laboratory technician  . Financial resource strain: Not on file  . Food insecurity:    Worry: Not on file    Inability: Not on file  . Transportation needs:    Medical: Not on file    Non-medical: Not on file  Tobacco Use  . Smoking status: Former Smoker    Types: Cigarettes    Last attempt to quit: 03/20/2000    Years since quitting: 17.3  . Smokeless tobacco: Never Used  . Tobacco comment: Pt states that she smoked off and on- "I don't know for how long"    Substance and Sexual Activity  . Alcohol use: Yes    Alcohol/week: 0.0 oz    Comment: occ  . Drug use: No  . Sexual activity: Not on file  Lifestyle  . Physical activity:    Days per week: Not on file    Minutes per session: Not on file  . Stress: Not on file  Relationships  . Social connections:    Talks on phone: Not on file    Gets together: Not on file    Attends religious service: Not on file    Active member of club or organization: Not on file    Attends meetings of clubs or organizations: Not on file    Relationship status: Not on file  . Intimate partner violence:    Fear of current or ex partner: Not on file    Emotionally abused: Not on file    Physically abused: Not on file    Forced sexual activity: Not on file  Other Topics Concern  . Not on file  Social History Narrative   Married    Current Outpatient Medications on File Prior to Visit  Medication Sig Dispense Refill  . anastrozole (ARIMIDEX) 1 MG tablet Take 1 tablet (1 mg total) daily by mouth. 90 tablet 3  . Calcium Citrate-Vitamin D (CITRACAL + D PO) Take by mouth daily.    Marland Kitchen  cetirizine (ZYRTEC) 10 MG tablet Take 10 mg by mouth daily.    . hydrochlorothiazide (HYDRODIURIL) 12.5 MG tablet Take 1 tablet (12.5 mg total) by mouth daily. 90 tablet 3   No current facility-administered medications on file prior to visit.     Allergies  Allergen Reactions  . Losartan Potassium     REACTION: sob  . Olmesartan Medoxomil     REACTION: sob  . Simvastatin     REACTION: Malgias    Family History  Problem Relation Age of Onset  . Cancer Mother        "Throat" and colon cancer, both at an advanced age  . Diabetes Father   . Breast cancer Paternal Aunt   . Colon cancer Neg Hx     BP 138/78   Pulse 72   Resp 16   Wt 165 lb 2 oz (74.9 kg)   BMI 34.51 kg/m   Review of Systems Denies sob and numbness.     Objective:   Physical Exam VITAL SIGNS:  See vs page GENERAL: no distress LUNGS:  Clear  to auscultation.   Lab Results  Component Value Date   HGBA1C 5.5 07/03/2017       Assessment & Plan:  Hyperglycemia: stable Dyslipidemia: recheck today HTN: well-controlled.  Please continue the same medication Osteoporosis: recheck labs today  Patient Instructions  blood tests are requested for you today.  We'll let you know about the results. Please continue the same medications.  Please come back for a follow-up appointment in 6 months (after 01/02/17).

## 2017-07-09 ENCOUNTER — Ambulatory Visit (INDEPENDENT_AMBULATORY_CARE_PROVIDER_SITE_OTHER)
Admission: RE | Admit: 2017-07-09 | Discharge: 2017-07-09 | Disposition: A | Discharge status: Home / Self Care | Payer: Medicare Other | Source: Ambulatory Visit | Attending: Endocrinology | Admitting: Endocrinology

## 2017-07-09 DIAGNOSIS — M810 Age-related osteoporosis without current pathological fracture: Secondary | ICD-10-CM

## 2017-07-12 ENCOUNTER — Telehealth: Payer: Self-pay | Admitting: Endocrinology

## 2017-07-12 NOTE — Telephone Encounter (Signed)
I called patient & gave her lab results.

## 2017-07-12 NOTE — Telephone Encounter (Signed)
Patient called the [facility] today She missed a call from our office regarding her bone density test results.  Please advise

## 2017-11-05 ENCOUNTER — Telehealth: Payer: Self-pay | Admitting: Endocrinology

## 2017-11-05 NOTE — Telephone Encounter (Signed)
Paperwork received for orders for the pts bone density--checked chart there are 2 orders in the system. Called [facility] and let them know

## 2017-11-05 NOTE — Telephone Encounter (Signed)
I have called [facility] & patient is scheduled for both diagnostic 9/3 as well as bone density 10/3.

## 2017-12-25 ENCOUNTER — Inpatient Hospital Stay: Payer: Medicare Other | Admitting: Hematology and Oncology

## 2017-12-25 DIAGNOSIS — M8589 Other specified disorders of bone density and structure, multiple sites: Secondary | ICD-10-CM | POA: Diagnosis not present

## 2017-12-25 DIAGNOSIS — Z780 Asymptomatic menopausal state: Secondary | ICD-10-CM | POA: Diagnosis not present

## 2017-12-25 DIAGNOSIS — R928 Other abnormal and inconclusive findings on diagnostic imaging of breast: Secondary | ICD-10-CM | POA: Diagnosis not present

## 2017-12-25 DIAGNOSIS — Z853 Personal history of malignant neoplasm of breast: Secondary | ICD-10-CM | POA: Diagnosis not present

## 2017-12-25 LAB — HM MAMMOGRAPHY

## 2017-12-25 LAB — HM DEXA SCAN

## 2017-12-27 ENCOUNTER — Encounter: Payer: Self-pay | Admitting: Family

## 2017-12-27 NOTE — Progress Notes (Signed)
Outside notes received. Information abstracted. Notes sent to scan.  

## 2017-12-31 ENCOUNTER — Telehealth: Payer: Self-pay | Admitting: Hematology and Oncology

## 2017-12-31 ENCOUNTER — Inpatient Hospital Stay: Payer: Medicare Other | Attending: Hematology and Oncology | Admitting: Hematology and Oncology

## 2017-12-31 DIAGNOSIS — Z170 Estrogen receptor positive status [ER+]: Secondary | ICD-10-CM

## 2017-12-31 DIAGNOSIS — C50411 Malignant neoplasm of upper-outer quadrant of right female breast: Secondary | ICD-10-CM | POA: Insufficient documentation

## 2017-12-31 DIAGNOSIS — Z79811 Long term (current) use of aromatase inhibitors: Secondary | ICD-10-CM | POA: Diagnosis not present

## 2017-12-31 NOTE — Telephone Encounter (Signed)
Gave avs and calendar ° °

## 2017-12-31 NOTE — Assessment & Plan Note (Addendum)
01/19/2017 right lumpectomy: IDC grade 2, 1 cm, with high-grade DCIS, margins negative, ER 100%, PR 100%, HER-2 negative, Ki-67 15%, T1BNX stage I a  Recommendation: 1.  Radiation oncology determined that she does not need adjuvant radiation primarily because of her elderly age. 2. Adjuvant antiestrogen therapy with anastrozole 1 mg daily X 5 years started December 2018  Anastrozole toxicities:  Patient is experiencing decreased appetite and attributes this to anastrozole therapy. I instructed her to stop anastrozole for couple of weeks and assess.  If it is truly anastrozole related then she will call us back so we can switch her to letrozole.  Breast cancer surveillance: 1. Breast exam: 05/29/2017 2. mammogram: Done at [facility] 3. Bone density test 07/07/2017: T score -1.3 mild osteopenia Return to clinic in 1 yr for follow-up.

## 2017-12-31 NOTE — Progress Notes (Signed)
Patient Care Team: Marrian Salvage, [place] as PCP - General (Internal Medicine) Prentiss Bells, MD (Ophthalmology) Nicholas Lose, MD as Consulting Physician (Hematology and Oncology) Delice Bison Charlestine Massed, NP as Nurse Practitioner (Hematology and Oncology) Fanny Skates, MD as Consulting Physician (General Surgery)  DIAGNOSIS:  Encounter Diagnosis  Name Primary?  . Malignant neoplasm of upper-outer quadrant of right breast in female, estrogen receptor positive ([place])     SUMMARY OF ONCOLOGIC HISTORY:   Malignant neoplasm of upper-outer quadrant of right breast in female, estrogen receptor positive ([place])   12/27/2016 Initial Diagnosis    Screening detected right breast mass 7 mm at 9o clock position, axilla negative, biopsy: IDC grade 2 with DCIS ER 100%, PR 100%, Ki-67 15%,HER-2 negative ratio 1.48 T1b N0 stage IA clinical stage    01/19/2017 Surgery    Right lumpectomy: IDC grade 2, 1 cm, with high-grade DCIS, margins negative, ER 100%, PR 100%, HER-2 negative, Ki-67 15%, T1BNX stage I a    02/2017 -  Anti-estrogen oral therapy    Anastrozole daily     CHIEF COMPLIANT: Follow-up on anastrozole therapy  INTERVAL HISTORY: Alexandria Randolph is a 83 year old with above-mentioned history of right breast cancer treated with lumpectomy and is now on anastrozole therapy.  She is tolerating anastrozole fairly well.  The hot flashes have decreased in intensity.  She however is complaining of decrease in appetite.  However she has not lost any weight because she is substituting regular food with the chips and other carbohydrates.  She thinks it is related to anastrozole therapy.  REVIEW OF SYSTEMS:   Constitutional: Denies fevers, chills or abnormal weight loss Eyes: Denies blurriness of vision Ears, nose, mouth, throat, and face: Denies mucositis or sore throat Respiratory: Denies cough, dyspnea or wheezes Cardiovascular: Denies palpitation, chest discomfort Gastrointestinal:   Denies nausea, heartburn or change in bowel habits Skin: Denies abnormal skin rashes Lymphatics: Denies new lymphadenopathy or easy bruising Neurological:Denies numbness, tingling or new weaknesses Behavioral/Psych: Mood is stable, no new changes  Extremities: No lower extremity edema Breast:  denies any pain or lumps or nodules in either breasts All other systems were reviewed with the patient and are negative.  I have reviewed the past medical history, past surgical history, social history and family history with the patient and they are unchanged from previous note.  ALLERGIES:  is allergic to losartan potassium; olmesartan medoxomil; and simvastatin.  MEDICATIONS:  Current Outpatient Medications  Medication Sig Dispense Refill  . anastrozole (ARIMIDEX) 1 MG tablet Take 1 tablet (1 mg total) daily by mouth. 90 tablet 3  . Calcium Citrate-Vitamin D (CITRACAL + D PO) Take by mouth daily.     No current facility-administered medications for this visit.     PHYSICAL EXAMINATION: ECOG PERFORMANCE STATUS: 1 - Symptomatic but completely ambulatory  Vitals:   12/31/17 0926  BP: 140/83  Pulse: 92  Resp: 17  Temp: 98.8 F (37.1 C)  SpO2: 100%   Filed Weights   12/31/17 0926  Weight: 160 lb 4.8 oz (72.7 kg)    GENERAL:alert, no distress and comfortable SKIN: skin color, texture, turgor are normal, no rashes or significant lesions EYES: normal, Conjunctiva are pink and non-injected, sclera clear OROPHARYNX:no exudate, no erythema and lips, buccal mucosa, and tongue normal  NECK: supple, thyroid normal size, non-tender, without nodularity LYMPH:  no palpable lymphadenopathy in the cervical, axillary or inguinal LUNGS: clear to auscultation and percussion with normal breathing effort HEART: regular rate & rhythm and no murmurs  and no lower extremity edema ABDOMEN:abdomen soft, non-tender and normal bowel sounds MUSCULOSKELETAL:no cyanosis of digits and no clubbing  NEURO: alert  & oriented x 3 with fluent speech, no focal motor/sensory deficits EXTREMITIES: No lower extremity edema BREAST: No palpable masses or nodules in either right or left breasts. No palpable axillary supraclavicular or infraclavicular adenopathy no breast tenderness or nipple discharge. (exam performed in the presence of a chaperone)  LABORATORY DATA:  I have reviewed the data as listed CMP Latest Ref Rng & Units 01/03/2017 12/07/2014 11/01/2014  Glucose 70 - 140 mg/dl 124 - 135(H)  BUN 7.0 - 26.0 mg/dL 8.4 - 7  Creatinine 0.6 - 1.1 mg/dL 0.9 - 0.83  Sodium 136 - 145 mEq/L 143 - 138  Potassium 3.5 - 5.1 mEq/L 3.5 - 4.0  Chloride 101 - 111 mmol/L - - 102  CO2 22 - 29 mEq/L 21(L) - 25  Calcium 8.4 - 10.4 mg/dL 9.6 9.4 9.5  Total Protein 6.4 - 8.3 g/dL 7.5 - -  Total Bilirubin 0.20 - 1.20 mg/dL 0.88 - -  Alkaline Phos 40 - 150 U/L 101 - -  AST 5 - 34 U/L 13 - -  ALT 0 - 55 U/L 11 - -    Lab Results  Component Value Date   WBC 4.6 01/03/2017   HGB 14.3 01/03/2017   HCT 44.5 01/03/2017   MCV 92.7 01/03/2017   PLT 287 01/03/2017   NEUTROABS 3.2 01/03/2017    ASSESSMENT & PLAN:  Malignant neoplasm of upper-outer quadrant of right breast in female, estrogen receptor positive ([place]) 01/19/2017 right lumpectomy: IDC grade 2, 1 cm, with high-grade DCIS, margins negative, ER 100%, PR 100%, HER-2 negative, Ki-67 15%, T1BNX stage I a  Recommendation: 1.  Radiation oncology determined that she does not need adjuvant radiation primarily because of her elderly age. 2. Adjuvant antiestrogen therapy with anastrozole 1 mg daily X 5 years started December 2018  Anastrozole toxicities:  Patient is experiencing decreased appetite and attributes this to anastrozole therapy. I instructed her to stop anastrozole for couple of weeks and assess.  If it is truly anastrozole related then she will call us back so we can switch her to letrozole.  Breast cancer surveillance: 1. Breast exam: 05/29/2017 2.  mammogram: Done at [facility] 3. Bone density test 07/07/2017: T score -1.3 mild osteopenia Return to clinic in 1 yr for follow-up.    No orders of the defined types were placed in this encounter.  The patient has a good understanding of the overall plan. she agrees with it. she will call with any problems that may develop before the next visit here.   Harriette Ohara, MD 12/31/17

## 2018-01-03 ENCOUNTER — Encounter: Payer: Self-pay | Admitting: Family

## 2018-01-04 ENCOUNTER — Telehealth: Payer: Self-pay | Admitting: Endocrinology

## 2018-01-04 NOTE — Telephone Encounter (Signed)
please call patient: You have mildly low bone density.  You should take calcium 1200 mg per day, and vitamin-D, 400 units per day

## 2018-01-07 ENCOUNTER — Telehealth: Payer: Self-pay | Admitting: Endocrinology

## 2018-01-07 DIAGNOSIS — H40033 Anatomical narrow angle, bilateral: Secondary | ICD-10-CM | POA: Diagnosis not present

## 2018-01-07 NOTE — Telephone Encounter (Signed)
Patient returned call regarding lab results

## 2018-01-07 NOTE — Telephone Encounter (Signed)
lft vm to return call 

## 2018-01-07 NOTE — Telephone Encounter (Signed)
Pt aware of results 

## 2018-01-10 ENCOUNTER — Ambulatory Visit: Payer: Federal, State, Local not specified - PPO | Admitting: Endocrinology

## 2018-01-10 ENCOUNTER — Encounter: Payer: Self-pay | Admitting: Family

## 2018-01-10 NOTE — Progress Notes (Signed)
Outside notes received. Information abstracted. Notes sent to scan.  

## 2018-01-11 ENCOUNTER — Telehealth: Payer: Self-pay | Admitting: Endocrinology

## 2018-01-11 NOTE — Telephone Encounter (Signed)
lft vm for pt to return call 

## 2018-01-11 NOTE — Telephone Encounter (Signed)
please call patient: Bone density is good.  I hope you feel well.

## 2018-01-11 NOTE — Telephone Encounter (Signed)
Pt stated she is returning call to nurse

## 2018-01-14 ENCOUNTER — Telehealth: Payer: Self-pay

## 2018-01-14 MED ORDER — LETROZOLE 2.5 MG PO TABS: 2.5000 mg | ORAL_TABLET | Freq: Every day | ORAL | 2 refills | Status: DC

## 2018-01-14 NOTE — Telephone Encounter (Signed)
Called pt and informed her of bone density results. Verbalized acceptance and understanding.

## 2018-01-14 NOTE — Telephone Encounter (Signed)
Returned patient's call.  Patient calling to follow up with update on appetite after stopping the anastrozole for 2 weeks.   Patient states she has her full appetite back.    Per Dr. Lindi Adie recommendations, change medication to Letrozole.  Patient aware and voiced understanding and agreement.  Patient educated on possible side effects.  Patient will update with any concerns or questions.  No further needs at this time.

## 2018-03-11 ENCOUNTER — Encounter: Payer: Federal, State, Local not specified - PPO | Admitting: Family

## 2018-04-03 ENCOUNTER — Other Ambulatory Visit (INDEPENDENT_AMBULATORY_CARE_PROVIDER_SITE_OTHER): Payer: Medicare Other

## 2018-04-03 ENCOUNTER — Encounter: Payer: Self-pay | Admitting: Family

## 2018-04-03 ENCOUNTER — Other Ambulatory Visit: Payer: Self-pay | Admitting: Hematology and Oncology

## 2018-04-03 ENCOUNTER — Ambulatory Visit (INDEPENDENT_AMBULATORY_CARE_PROVIDER_SITE_OTHER): Payer: Medicare Other | Admitting: Family

## 2018-04-03 VITALS — BP 136/80 | HR 80 | Temp 98.3°F | Ht <= 58 in | Wt 157.4 lb

## 2018-04-03 DIAGNOSIS — Z23 Encounter for immunization: Secondary | ICD-10-CM | POA: Diagnosis not present

## 2018-04-03 DIAGNOSIS — Z860101 Personal history of adenomatous and serrated colon polyps: Secondary | ICD-10-CM

## 2018-04-03 DIAGNOSIS — E559 Vitamin D deficiency, unspecified: Secondary | ICD-10-CM

## 2018-04-03 DIAGNOSIS — Z86010 Personal history of colonic polyps: Secondary | ICD-10-CM

## 2018-04-03 DIAGNOSIS — R5383 Other fatigue: Secondary | ICD-10-CM | POA: Diagnosis not present

## 2018-04-03 DIAGNOSIS — M8580 Other specified disorders of bone density and structure, unspecified site: Secondary | ICD-10-CM

## 2018-04-03 DIAGNOSIS — Z13220 Encounter for screening for lipoid disorders: Secondary | ICD-10-CM | POA: Diagnosis not present

## 2018-04-03 LAB — CBC WITH DIFFERENTIAL/PLATELET
BASOS PCT: 1.2 % (ref 0.0–3.0)
Basophils Absolute: 0.1 10*3/uL (ref 0.0–0.1)
EOS PCT: 1 % (ref 0.0–5.0)
Eosinophils Absolute: 0.1 10*3/uL (ref 0.0–0.7)
HEMATOCRIT: 42.4 % (ref 36.0–46.0)
HEMOGLOBIN: 14.1 g/dL (ref 12.0–15.0)
LYMPHS PCT: 24.6 % (ref 12.0–46.0)
Lymphs Abs: 1.3 10*3/uL (ref 0.7–4.0)
MCHC: 33.1 g/dL (ref 30.0–36.0)
MCV: 91 fl (ref 78.0–100.0)
Monocytes Absolute: 0.5 10*3/uL (ref 0.1–1.0)
Monocytes Relative: 8.9 % (ref 3.0–12.0)
Neutro Abs: 3.5 10*3/uL (ref 1.4–7.7)
Neutrophils Relative %: 64.3 % (ref 43.0–77.0)
Platelets: 343 10*3/uL (ref 150.0–400.0)
RBC: 4.66 Mil/uL (ref 3.87–5.11)
RDW: 13.1 % (ref 11.5–15.5)
WBC: 5.5 10*3/uL (ref 4.0–10.5)

## 2018-04-03 LAB — LIPID PANEL
CHOLESTEROL: 230 mg/dL — AB (ref 0–200)
HDL: 81.2 mg/dL (ref >39.00)
LDL Cholesterol: 125 mg/dL — ABNORMAL HIGH (ref 0–99)
NonHDL: 148.38
TRIGLYCERIDES: 117 mg/dL (ref 0.0–149.0)
Total CHOL/HDL Ratio: 3
VLDL: 23.4 mg/dL (ref 0.0–40.0)

## 2018-04-03 LAB — VITAMIN D 25 HYDROXY (VIT D DEFICIENCY, FRACTURES): VITD: 39.12 ng/mL (ref 30.00–100.00)

## 2018-04-03 LAB — COMPREHENSIVE METABOLIC PANEL
ALBUMIN: 4.2 g/dL (ref 3.5–5.2)
ALK PHOS: 94 U/L (ref 39–117)
ALT: 15 U/L (ref 0–35)
AST: 14 U/L (ref 0–37)
BUN: 9 mg/dL (ref 6–23)
CALCIUM: 9.7 mg/dL (ref 8.4–10.5)
CHLORIDE: 105 meq/L (ref 96–112)
CO2: 24 mEq/L (ref 19–32)
Creatinine, Ser: 0.97 mg/dL (ref 0.40–1.20)
GFR: 70.73 mL/min (ref >60.00)
Glucose, Bld: 102 mg/dL — ABNORMAL HIGH (ref 70–99)
POTASSIUM: 3.5 meq/L (ref 3.5–5.1)
Sodium: 140 mEq/L (ref 135–145)
TOTAL PROTEIN: 7.5 g/dL (ref 6.0–8.3)
Total Bilirubin: 0.9 mg/dL (ref 0.2–1.2)

## 2018-04-03 LAB — TSH: TSH: 1.83 u[IU]/mL (ref 0.35–4.50)

## 2018-04-03 NOTE — Progress Notes (Signed)
Alexandria Randolph is a 84 y.o. female with the following history as recorded in EpicCare:  Patient Active Problem List   Diagnosis Date Noted  . Malignant neoplasm of upper-outer quadrant of right breast in female, estrogen receptor positive ([place]) 01/02/2017  . Cough variant asthma 12/24/2014  . Asthma with exacerbation 12/07/2014  . Arthritis of right hip 02/18/2014  . Osteoarthritis of right knee 11/27/2012  . Wellness examination 09/14/2010  . Chronic rhinitis 09/14/2010  . HIP PAIN, RIGHT 06/22/2009  . Dyslipidemia 12/01/2006  . SMOKER 12/01/2006  . HTN (hypertension) 12/01/2006  . Osteoporosis 12/01/2006  . Hyperglycemia 12/01/2006    Current Outpatient Medications  Medication Sig Dispense Refill  . Calcium Citrate-Vitamin D (CITRACAL + D PO) Take by mouth daily.    Marland Kitchen letrozole (FEMARA) 2.5 MG tablet TAKE ONE TABLET BY MOUTH DAILY 90 tablet 1   No current facility-administered medications for this visit.     Allergies: Losartan potassium; Olmesartan medoxomil; and Simvastatin  Past Medical History:  Diagnosis Date  . Allergy    SEASONAL  . Cataracts, bilateral   . DYSLIPIDEMIA 12/01/2006  . Environmental allergies    has had allergy test:grass,trees,plants,dust  . HYPERGLYCEMIA 12/01/2006  . HYPERTENSION 12/01/2006  . OSTEOPOROSIS 12/01/2006  . SMOKER 12/01/2006    Past Surgical History:  Procedure Laterality Date  . ABDOMINAL HYSTERECTOMY    . BREAST LUMPECTOMY WITH RADIOACTIVE SEED LOCALIZATION Right 01/19/2017   Procedure: RIGHT BREAST LUMPECTOMY WITH RADIOACTIVE SEED LOCALIZATION;  Surgeon: Fanny Skates, MD;  Location: [facility];  Service: General;  Laterality: Right;  . COLONOSCOPY    . POLYPECTOMY    . TOTAL ABDOMINAL HYSTERECTOMY W/ BILATERAL SALPINGOOPHORECTOMY  1980    Family History  Problem Relation Age of Onset  . Cancer Mother        "Throat" and colon cancer, both at an advanced age  . Diabetes Father   . Breast cancer Paternal  Aunt   . Colon cancer Neg Hx     Social History   Tobacco Use  . Smoking status: Former Smoker    Types: Cigarettes    Last attempt to quit: 03/20/2000    Years since quitting: 18.0  . Smokeless tobacco: Never Used  . Tobacco comment: Pt states that she smoked off and on- "I don't know for how long"  Substance Use Topics  . Alcohol use: Yes    Alcohol/week: 0.0 standard drinks    Comment: occ    Subjective:  Patient presents to transfer care from another provider; history of breast cancer- currently in remission, doing very well- sees oncology every 6 months- 1 year; agreeable to seeing GI to discuss safety of repeating colonoscopy; in baseline state of health- planning to join [employer]- knows she needs to exercise more regularly;  No acute concerns today; Has dog, Chevy; no children of her own- has helped to raise sister/ godchildren; feels like she has very good support system.     Objective:  Vitals:   04/03/18 0858  BP: 136/80  Pulse: 80  Temp: 98.3 F (36.8 C)  TempSrc: Oral  SpO2: 99%  Weight: 157 lb 6.4 oz (71.4 kg)  Height: 4' 10" (1.473 m)    General: Well developed, well nourished, in no acute distress  Skin : Warm and dry.  Head: Normocephalic and atraumatic  Eyes: Sclera and conjunctiva clear; pupils round and reactive to light; extraocular movements intact  Ears: External normal; canals clear; tympanic membranes normal  Oropharynx: Pink, supple. No  suspicious lesions  Neck: Supple without thyromegaly, adenopathy  Lungs: Respirations unlabored; clear to auscultation bilaterally without wheeze, rales, rhonchi  CVS exam: normal rate and regular rhythm.  Abdomen: Soft; nontender; nondistended; normoactive bowel sounds; no masses or hepatosplenomegaly  Musculoskeletal: No deformities; no active joint inflammation  Extremities: No edema, cyanosis, clubbing  Vessels: Symmetric bilaterally  Neurologic: Alert and oriented; speech intact; face symmetrical; moves all  extremities well; CNII-XII intact without focal deficit   Assessment:  1. Hx of adenomatous colonic polyps   2. Other fatigue   3. Lipid screening   4. Osteopenia, unspecified location   5. Vitamin D deficiency   6. Need for influenza vaccination     Plan:  1. Refer to GI for further evaluation; Labs updated today; congratulated patient on commitment to health; flu shot updated; see oncology as scheduled. Follow-up in 1 year, sooner prn.   No follow-ups on file.  Orders Placed This Encounter  Procedures  . Flu vaccine HIGH DOSE PF  . CBC w/Diff    Standing Status:   Future    Number of Occurrences:   1    Standing Expiration Date:   04/03/2019  . Comp Met (CMET)    Standing Status:   Future    Number of Occurrences:   1    Standing Expiration Date:   04/03/2019  . Lipid panel    Standing Status:   Future    Number of Occurrences:   1    Standing Expiration Date:   04/04/2019  . TSH    Standing Status:   Future    Number of Occurrences:   1    Standing Expiration Date:   04/03/2019  . Vitamin D (25 hydroxy)    Standing Status:   Future    Number of Occurrences:   1    Standing Expiration Date:   04/03/2019  . Ambulatory referral to Gastroenterology    Referral Priority:   Routine    Referral Type:   Consultation    Referral Reason:   Specialty Services Required    Referred to Provider:   Jerene Bears, MD    Number of Visits Requested:   1    Requested Prescriptions    No prescriptions requested or ordered in this encounter

## 2018-04-08 ENCOUNTER — Encounter: Payer: Self-pay | Admitting: Physician Assistant

## 2018-04-16 ENCOUNTER — Ambulatory Visit: Payer: Federal, State, Local not specified - PPO | Admitting: Physician Assistant

## 2018-04-29 ENCOUNTER — Telehealth: Payer: Self-pay | Admitting: *Deleted

## 2018-04-29 NOTE — Telephone Encounter (Signed)
Received TC from patient. She states she is on Letrozole.   She states she is experiencing decreased appetite for the last couple of weeks, though no change in her weight.  Also she states she is noticing tingly up and down her left arm to her neck.  It happens almost daily but doesn't last long. I can happen if she turns her head a certain way as well. Informed patient that I would pass information on to Dr. Lindi Adie.  She does not see him until October 2020.

## 2018-07-09 ENCOUNTER — Encounter: Payer: Self-pay | Admitting: Internal Medicine

## 2018-11-12 ENCOUNTER — Other Ambulatory Visit: Payer: Self-pay | Admitting: Hematology and Oncology

## 2018-12-09 ENCOUNTER — Telehealth: Payer: Self-pay | Admitting: Hematology and Oncology

## 2018-12-09 NOTE — Telephone Encounter (Signed)
Romeo 10/14 MOVED F/U FROM AM TO PM. LEFT MESSAGE. SCHEDULE MAILED.

## 2018-12-10 ENCOUNTER — Telehealth: Payer: Self-pay | Admitting: Hematology and Oncology

## 2018-12-10 NOTE — Telephone Encounter (Signed)
Returned call to patient per staff message from desk nurse. Per message patient needs morning appointments.   Spoke with patient and per patient she spoke with person who bring her and he will bring her in the afternoon. Per patient she will keep 10/14 appointment as scheduled.

## 2018-12-31 DIAGNOSIS — Z853 Personal history of malignant neoplasm of breast: Secondary | ICD-10-CM | POA: Diagnosis not present

## 2018-12-31 LAB — HM MAMMOGRAPHY

## 2018-12-31 NOTE — Progress Notes (Signed)
Patient Care Team: Marrian Salvage, [place] as PCP - General (Internal Medicine)  DIAGNOSIS:    ICD-10-CM   1. Malignant neoplasm of upper-outer quadrant of right breast in female, estrogen receptor positive ([place])  C50.411    Z17.0     SUMMARY OF ONCOLOGIC HISTORY: Oncology History  Malignant neoplasm of upper-outer quadrant of right breast in female, estrogen receptor positive ([place])  12/27/2016 Initial Diagnosis   Screening detected right breast mass 7 mm at 9o clock position, axilla negative, biopsy: IDC grade 2 with DCIS ER 100%, PR 100%, Ki-67 15%,HER-2 negative ratio 1.48 T1b N0 stage IA clinical stage   01/19/2017 Surgery   Right lumpectomy: IDC grade 2, 1 cm, with high-grade DCIS, margins negative, ER 100%, PR 100%, HER-2 negative, Ki-67 15%, T1BNX stage I a   02/2017 -  Anti-estrogen oral therapy   Anastrozole daily     CHIEF COMPLIANT: Follow-up on anastrozole therapy  INTERVAL HISTORY: Alexandria Randolph is a 84 y.o. with above-mentioned history of right breast cancer treated with lumpectomy and who is on anti-estrogen therapy with anastrozole. I last saw her a year ago. She presents to the clinic today for annual follow-up.   REVIEW OF SYSTEMS:   Constitutional: Denies fevers, chills or abnormal weight loss Eyes: Denies blurriness of vision Ears, nose, mouth, throat, and face: Denies mucositis or sore throat Respiratory: Denies cough, dyspnea or wheezes Cardiovascular: Denies palpitation, chest discomfort Gastrointestinal: Denies nausea, heartburn or change in bowel habits Skin: Denies abnormal skin rashes Lymphatics: Denies new lymphadenopathy or easy bruising Neurological: Denies numbness, tingling or new weaknesses Behavioral/Psych: Mood is stable, no new changes  Extremities: No lower extremity edema Breast: denies any pain or lumps or nodules in either breasts All other systems were reviewed with the patient and are negative.  I have reviewed the past  medical history, past surgical history, social history and family history with the patient and they are unchanged from previous note.  ALLERGIES:  is allergic to losartan potassium; olmesartan medoxomil; and simvastatin.  MEDICATIONS:  Current Outpatient Medications  Medication Sig Dispense Refill  . Calcium Citrate-Vitamin D (CITRACAL + D PO) Take by mouth daily.    Marland Kitchen letrozole (FEMARA) 2.5 MG tablet TAKE ONE TABLET BY MOUTH DAILY 90 tablet 0   No current facility-administered medications for this visit.     PHYSICAL EXAMINATION: ECOG PERFORMANCE STATUS: 1 - Symptomatic but completely ambulatory  There were no vitals filed for this visit. There were no vitals filed for this visit.  GENERAL: alert, no distress and comfortable SKIN: skin color, texture, turgor are normal, no rashes or significant lesions EYES: normal, Conjunctiva are pink and non-injected, sclera clear OROPHARYNX: no exudate, no erythema and lips, buccal mucosa, and tongue normal  NECK: supple, thyroid normal size, non-tender, without nodularity LYMPH: no palpable lymphadenopathy in the cervical, axillary or inguinal LUNGS: clear to auscultation and percussion with normal breathing effort HEART: regular rate & rhythm and no murmurs and no lower extremity edema ABDOMEN: abdomen soft, non-tender and normal bowel sounds MUSCULOSKELETAL: no cyanosis of digits and no clubbing  NEURO: alert & oriented x 3 with fluent speech, no focal motor/sensory deficits EXTREMITIES: No lower extremity edema BREAST: No palpable masses or nodules in either right or left breasts. No palpable axillary supraclavicular or infraclavicular adenopathy no breast tenderness or nipple discharge. (exam performed in the presence of a chaperone)  LABORATORY DATA:  I have reviewed the data as listed CMP Latest Ref Rng & Units 04/03/2018 01/03/2017  12/07/2014  Glucose 70 - 99 mg/dL 102(H) 124 -  BUN 6 - 23 mg/dL 9 8.4 -  Creatinine 0.40 - 1.20 mg/dL  0.97 0.9 -  Sodium 135 - 145 mEq/L 140 143 -  Potassium 3.5 - 5.1 mEq/L 3.5 3.5 -  Chloride 96 - 112 mEq/L 105 - -  CO2 19 - 32 mEq/L 24 21(L) -  Calcium 8.4 - 10.5 mg/dL 9.7 9.6 9.4  Total Protein 6.0 - 8.3 g/dL 7.5 7.5 -  Total Bilirubin 0.2 - 1.2 mg/dL 0.9 0.88 -  Alkaline Phos 39 - 117 U/L 94 101 -  AST 0 - 37 U/L 14 13 -  ALT 0 - 35 U/L 15 11 -    Lab Results  Component Value Date   WBC 5.5 04/03/2018   HGB 14.1 04/03/2018   HCT 42.4 04/03/2018   MCV 91.0 04/03/2018   PLT 343.0 04/03/2018   NEUTROABS 3.5 04/03/2018    ASSESSMENT & PLAN:  Malignant neoplasm of upper-outer quadrant of right breast in female, estrogen receptor positive ([place]) 01/19/2017 right lumpectomy: IDC grade 2, 1 cm, with high-grade DCIS, margins negative, ER 100%, PR 100%, HER-2 negative, Ki-67 15%, T1BNX stage I a  Recommendation: 1.Radiation oncology determined that she does not need adjuvant radiationprimarily because of her elderly age. 2. Adjuvant antiestrogen therapy with anastrozole 1 mg dailyX5 years started December 2018  Anastrozole toxicities:  Patient is experiencing decreased appetite and attributes this to anastrozole therapy. I instructed her to stop anastrozole for couple of weeks and assess.  If it is truly anastrozole related then she will call us back so we can switch her to letrozole.  Breast cancer surveillance: 1. Breast exam: 01/01/2019: Benign 2. mammogram: at [facility] 12/31/2018: Benign breast density category B 3. Bone density test 12/25/2017: T score -1.5 mild osteopenia Return to clinic in 1 yr for follow-up.  No orders of the defined types were placed in this encounter.  The patient has a good understanding of the overall plan. she agrees with it. she will call with any problems that may develop before the next visit here.  Nicholas Lose, MD 01/01/2019  Julious Oka Dorshimer am acting as scribe for Dr. Nicholas Lose.  I have reviewed the above documentation for  accuracy and completeness, and I agree with the above.

## 2019-01-01 ENCOUNTER — Other Ambulatory Visit: Payer: Self-pay

## 2019-01-01 ENCOUNTER — Inpatient Hospital Stay: Payer: Medicare Other | Attending: Hematology and Oncology | Admitting: Hematology and Oncology

## 2019-01-01 DIAGNOSIS — C50411 Malignant neoplasm of upper-outer quadrant of right female breast: Secondary | ICD-10-CM | POA: Insufficient documentation

## 2019-01-01 DIAGNOSIS — Z79811 Long term (current) use of aromatase inhibitors: Secondary | ICD-10-CM | POA: Insufficient documentation

## 2019-01-01 DIAGNOSIS — Z170 Estrogen receptor positive status [ER+]: Secondary | ICD-10-CM | POA: Insufficient documentation

## 2019-01-01 DIAGNOSIS — M8580 Other specified disorders of bone density and structure, unspecified site: Secondary | ICD-10-CM | POA: Insufficient documentation

## 2019-01-01 MED ORDER — LETROZOLE 2.5 MG PO TABS: 2.5000 mg | ORAL_TABLET | Freq: Every day | ORAL | 3 refills | Status: DC

## 2019-01-01 NOTE — Assessment & Plan Note (Signed)
01/19/2017 right lumpectomy: IDC grade 2, 1 cm, with high-grade DCIS, margins negative, ER 100%, PR 100%, HER-2 negative, Ki-67 15%, T1BNX stage I a  Recommendation: 1.Radiation oncology determined that she does not need adjuvant radiationprimarily because of her elderly age. 2. Adjuvant antiestrogen therapy with anastrozole 1 mg dailyX5 years started December 2018  Anastrozole toxicities:  Patient is experiencing decreased appetite and attributes this to anastrozole therapy. I instructed her to stop anastrozole for couple of weeks and assess.  If it is truly anastrozole related then she will call us back so we can switch her to letrozole.  Breast cancer surveillance: 1. Breast exam: 01/01/2019: Benign 2. mammogram: at [facility] 12/25/2017: Benign breast density category B 3. Bone density test 12/25/2017: T score -1.5 mild osteopenia Return to clinic in 1 yr for follow-up.

## 2019-01-02 ENCOUNTER — Encounter: Payer: Self-pay | Admitting: Family

## 2019-01-02 ENCOUNTER — Telehealth: Payer: Self-pay | Admitting: Hematology and Oncology

## 2019-01-02 NOTE — Progress Notes (Signed)
Outside notes received. Information abstracted. Notes sent to scan.  

## 2019-01-02 NOTE — Telephone Encounter (Signed)
I talk with patient regarding schedule  

## 2019-05-16 ENCOUNTER — Ambulatory Visit: Payer: Medicare Other | Attending: Internal Medicine

## 2019-05-16 DIAGNOSIS — Z23 Encounter for immunization: Secondary | ICD-10-CM | POA: Insufficient documentation

## 2019-05-16 NOTE — Progress Notes (Signed)
   Covid-19 Vaccination Clinic  Name:  Alexandria Randolph    MRN: [id number] DOB: 03-19-1935  05/16/2019  [name] was observed post Covid-19 immunization for 15 minutes without incidence. She was provided with Vaccine Information Sheet and instruction to access the V-Safe system.   [name] was instructed to call 911 with any severe reactions post vaccine: Marland Kitchen Difficulty breathing  . Swelling of your face and throat  . A fast heartbeat  . A bad rash all over your body  . Dizziness and weakness    Immunizations Administered    Name Date Dose VIS Date Route   Pfizer COVID-19 Vaccine 05/16/2019  3:18 PM 0.3 mL 02/28/2019 Intramuscular   Manufacturer: [place]   Lot: [id number]   [place]: [id number]

## 2019-06-03 ENCOUNTER — Telehealth: Payer: Self-pay

## 2019-06-03 NOTE — Telephone Encounter (Signed)
Called patient to offer htn clinic told patient to give the office a call back.

## 2019-06-11 ENCOUNTER — Ambulatory Visit: Payer: Medicare Other | Attending: Internal Medicine

## 2019-06-11 DIAGNOSIS — Z23 Encounter for immunization: Secondary | ICD-10-CM

## 2019-06-11 NOTE — Progress Notes (Signed)
   Covid-19 Vaccination Clinic  Name:  Alexandria Randolph    MRN: [id number] DOB: 1935/02/18  06/11/2019  Ms. Burn was observed post Covid-19 immunization for 15 minutes without incident. She was provided with Vaccine Information Sheet and instruction to access the V-Safe system.   [name] was instructed to call 911 with any severe reactions post vaccine: Marland Kitchen Difficulty breathing  . Swelling of face and throat  . A fast heartbeat  . A bad rash all over body  . Dizziness and weakness   Immunizations Administered    Name Date Dose VIS Date Route   Pfizer COVID-19 Vaccine 06/11/2019 10:56 AM 0.3 mL 02/28/2019 Intramuscular   Manufacturer: [place]   Lot: [id number]   [place]: [id number]

## 2020-01-02 ENCOUNTER — Inpatient Hospital Stay
Payer: Federal, State, Local not specified - PPO | Attending: Hematology and Oncology | Admitting: Hematology and Oncology

## 2020-01-02 NOTE — Assessment & Plan Note (Deleted)
01/19/2017 right lumpectomy: IDC grade 2, 1 cm, with high-grade DCIS, margins negative, ER 100%, PR 100%, HER-2 negative, Ki-67 15%, T1BNX stage I a  Recommendation: 1.Radiation oncology determined that she does not need adjuvant radiationprimarily because of her elderly age. 2. Adjuvant antiestrogen therapy with anastrozole 1 mg dailyX5 yearsstartedDecember 2018  Anastrozole toxicities: Patient is experiencing decreased appetite and attributes this to anastrozole therapy. I instructed her to stop anastrozole for couple of weeks and assess. If it is truly anastrozole related then she will call us back so we can switch her to letrozole.  Breast cancer surveillance: 1.Breast exam: 01/02/2020: Benign 2.mammogram:at [facility] 12/31/2018: Benign breast density category B 3.Bone density test 12/25/2017: T score -1.5 mild osteopenia Return to clinic in1 yrforfollow-up.

## 2020-04-26 ENCOUNTER — Telehealth: Payer: Self-pay

## 2020-04-26 NOTE — Telephone Encounter (Signed)
Pt notified that screening mammogram appt made for 04/29/20 at 0915.  Shared phone number to [facility] pt aware of location as she has been there before.

## 2020-04-26 NOTE — Telephone Encounter (Signed)
Received notification from [facility] that pt needs f/u mammogram & they have not been able to reach her.  Offered pt assistance in scheduling mammogram & pt accepts.  Will call [facility] to schedule & f/u with pt.  Pt verb understanding.

## 2020-04-30 ENCOUNTER — Telehealth: Payer: Self-pay | Admitting: Family

## 2020-04-30 NOTE — Telephone Encounter (Signed)
I am going to sign her mammogram order but she has not been seen here since 2020; she is overdue for OV and must schedule.

## 2020-05-03 NOTE — Telephone Encounter (Signed)
Pt notified of Laura's note; pt verb understanding.  OV scheduled for 05/11/20.

## 2020-05-04 DIAGNOSIS — Z853 Personal history of malignant neoplasm of breast: Secondary | ICD-10-CM | POA: Diagnosis not present

## 2020-05-04 LAB — HM MAMMOGRAPHY

## 2020-05-06 ENCOUNTER — Encounter: Payer: Self-pay | Admitting: Family

## 2020-05-11 ENCOUNTER — Telehealth: Payer: Self-pay | Admitting: Hematology and Oncology

## 2020-05-11 ENCOUNTER — Other Ambulatory Visit: Payer: Self-pay

## 2020-05-11 ENCOUNTER — Ambulatory Visit (INDEPENDENT_AMBULATORY_CARE_PROVIDER_SITE_OTHER): Payer: Medicare Other | Admitting: Family

## 2020-05-11 VITALS — BP 142/80 | HR 101 | Temp 98.0°F | Resp 18 | Ht 59.0 in | Wt 136.8 lb

## 2020-05-11 DIAGNOSIS — C50411 Malignant neoplasm of upper-outer quadrant of right female breast: Secondary | ICD-10-CM | POA: Diagnosis not present

## 2020-05-11 DIAGNOSIS — Z13220 Encounter for screening for lipoid disorders: Secondary | ICD-10-CM

## 2020-05-11 DIAGNOSIS — E559 Vitamin D deficiency, unspecified: Secondary | ICD-10-CM

## 2020-05-11 DIAGNOSIS — E785 Hyperlipidemia, unspecified: Secondary | ICD-10-CM | POA: Diagnosis not present

## 2020-05-11 DIAGNOSIS — Z170 Estrogen receptor positive status [ER+]: Secondary | ICD-10-CM | POA: Diagnosis not present

## 2020-05-11 DIAGNOSIS — Z86010 Personal history of colonic polyps: Secondary | ICD-10-CM

## 2020-05-11 LAB — CBC WITH DIFFERENTIAL/PLATELET
Basophils Absolute: 0 10*3/uL (ref 0.0–0.1)
Basophils Relative: 0.8 % (ref 0.0–3.0)
Eosinophils Absolute: 0 10*3/uL (ref 0.0–0.7)
Eosinophils Relative: 0.3 % (ref 0.0–5.0)
HCT: 42.8 % (ref 36.0–46.0)
Hemoglobin: 14.2 g/dL (ref 12.0–15.0)
Lymphocytes Relative: 21.4 % (ref 12.0–46.0)
Lymphs Abs: 1.1 10*3/uL (ref 0.7–4.0)
MCHC: 33.2 g/dL (ref 30.0–36.0)
MCV: 92.3 fl (ref 78.0–100.0)
Monocytes Absolute: 0.5 10*3/uL (ref 0.1–1.0)
Monocytes Relative: 10.5 % (ref 3.0–12.0)
Neutro Abs: 3.4 10*3/uL (ref 1.4–7.7)
Neutrophils Relative %: 67 % (ref 43.0–77.0)
Platelets: 254 10*3/uL (ref 150.0–400.0)
RBC: 4.64 Mil/uL (ref 3.87–5.11)
RDW: 13.3 % (ref 11.5–15.5)
WBC: 5 10*3/uL (ref 4.0–10.5)

## 2020-05-11 LAB — COMPREHENSIVE METABOLIC PANEL
ALT: 16 U/L (ref 0–35)
AST: 17 U/L (ref 0–37)
Albumin: 4.2 g/dL (ref 3.5–5.2)
Alkaline Phosphatase: 97 U/L (ref 39–117)
BUN: 12 mg/dL (ref 6–23)
CO2: 25 mEq/L (ref 19–32)
Calcium: 9.8 mg/dL (ref 8.4–10.5)
Chloride: 103 mEq/L (ref 96–112)
Creatinine, Ser: 0.96 mg/dL (ref 0.40–1.20)
GFR: 54.54 mL/min — ABNORMAL LOW (ref >60.00)
Glucose, Bld: 100 mg/dL — ABNORMAL HIGH (ref 70–99)
Potassium: 4.5 mEq/L (ref 3.5–5.1)
Sodium: 139 mEq/L (ref 135–145)
Total Bilirubin: 1.4 mg/dL — ABNORMAL HIGH (ref 0.2–1.2)
Total Protein: 7.2 g/dL (ref 6.0–8.3)

## 2020-05-11 LAB — LIPID PANEL
Cholesterol: 225 mg/dL — ABNORMAL HIGH (ref 0–200)
HDL: 103.3 mg/dL (ref >39.00)
LDL Cholesterol: 99 mg/dL (ref 0–99)
NonHDL: 121.79
Total CHOL/HDL Ratio: 2
Triglycerides: 112 mg/dL (ref 0.0–149.0)
VLDL: 22.4 mg/dL (ref 0.0–40.0)

## 2020-05-11 LAB — VITAMIN D 25 HYDROXY (VIT D DEFICIENCY, FRACTURES): VITD: 35.56 ng/mL (ref 30.00–100.00)

## 2020-05-11 NOTE — Progress Notes (Signed)
Alexandria Randolph is a 86 y.o. female with the following history as recorded in EpicCare:  Patient Active Problem List   Diagnosis Date Noted  . Malignant neoplasm of upper-outer quadrant of right breast in female, estrogen receptor positive ([place]) 01/02/2017  . Cough variant asthma 12/24/2014  . Asthma with exacerbation 12/07/2014  . Arthritis of right hip 02/18/2014  . Osteoarthritis of right knee 11/27/2012  . Wellness examination 09/14/2010  . Chronic rhinitis 09/14/2010  . HIP PAIN, RIGHT 06/22/2009  . Dyslipidemia 12/01/2006  . SMOKER 12/01/2006  . HTN (hypertension) 12/01/2006  . Osteoporosis 12/01/2006  . Hyperglycemia 12/01/2006    Current Outpatient Medications  Medication Sig Dispense Refill  . letrozole (FEMARA) 2.5 MG tablet Take 1 tablet (2.5 mg total) by mouth daily. 90 tablet 3  . Calcium Citrate-Vitamin D (CITRACAL + D PO) Take by mouth daily. (Patient not taking: Reported on 05/11/2020)     No current facility-administered medications for this visit.    Allergies: Losartan potassium, Olmesartan medoxomil, and Simvastatin  Past Medical History:  Diagnosis Date  . Allergy    SEASONAL  . Cataracts, bilateral   . DYSLIPIDEMIA 12/01/2006  . Environmental allergies    has had allergy test:grass,trees,plants,dust  . HYPERGLYCEMIA 12/01/2006  . HYPERTENSION 12/01/2006  . OSTEOPOROSIS 12/01/2006  . SMOKER 12/01/2006    Past Surgical History:  Procedure Laterality Date  . ABDOMINAL HYSTERECTOMY    . BREAST LUMPECTOMY WITH RADIOACTIVE SEED LOCALIZATION Right 01/19/2017   Procedure: RIGHT BREAST LUMPECTOMY WITH RADIOACTIVE SEED LOCALIZATION;  Surgeon: Fanny Skates, MD;  Location: [facility];  Service: General;  Laterality: Right;  . COLONOSCOPY    . POLYPECTOMY    . TOTAL ABDOMINAL HYSTERECTOMY W/ BILATERAL SALPINGOOPHORECTOMY  1980    Family History  Problem Relation Age of Onset  . Cancer Mother        "Throat" and colon cancer, both at an  advanced age  . Diabetes Father   . Breast cancer Paternal Aunt   . Colon cancer Neg Hx     Social History   Tobacco Use  . Smoking status: Former Smoker    Types: Cigarettes    Quit date: 03/20/2000    Years since quitting: 20.1  . Smokeless tobacco: Never Used  . Tobacco comment: Pt states that she smoked off and on- "I don't know for how long"  Substance Use Topics  . Alcohol use: Yes    Alcohol/week: 0.0 standard drinks    Comment: occ    Subjective:   Patient presents for yearly follow-up;  Has not been seen in our office since 2020;  Was due to see her oncologist in 12/2019- per patient, no one called her to schedule so she assumed she didn't have to go;  Sees eye doctor and dentist regularly;  Per patient, she has never had high blood pressure nor taken any medication; medical record indicates differently but patient is adamant; Denies any chest pain, shortness of breath, blurred vision or headache.   Recently had mammogram- normal; patient defers DEXA at this time;   Review of Systems  Constitutional: Negative.   HENT: Negative.   Eyes: Negative.   Respiratory: Negative.   Cardiovascular: Negative.   Gastrointestinal: Negative.   Genitourinary: Negative.   Musculoskeletal: Negative.   Skin: Negative.   Neurological: Negative.   Endo/Heme/Allergies: Negative.   Psychiatric/Behavioral: Negative.        Objective:  Vitals:   05/11/20 0943  BP: (!) 142/80  Pulse: (!) 101  Resp: 18  Temp: 98 F (36.7 C)  TempSrc: Oral  SpO2: 99%  Weight: 136 lb 12.8 oz (62.1 kg)  Height: _0  (1.499 m)    General: Well developed, well nourished, in no acute distress  Skin : Warm and dry.  Head: Normocephalic and atraumatic  Eyes: Sclera and conjunctiva clear; pupils round and reactive to light; extraocular movements intact  Ears: External normal; canals clear; tympanic membranes normal  Oropharynx: Pink, supple. No suspicious lesions  Neck: Supple without  thyromegaly, adenopathy  Lungs: Respirations unlabored; clear to auscultation bilaterally without wheeze, rales, rhonchi  CVS exam: normal rate and regular rhythm.  Abdomen: Soft; nontender; nondistended; normoactive bowel sounds; no masses or hepatosplenomegaly  Musculoskeletal: No deformities; no active joint inflammation  Extremities: No edema, cyanosis, clubbing  Vessels: Symmetric bilaterally  Neurologic: Alert and oriented; speech intact; face symmetrical; moves all extremities well; CNII-XII intact without focal deficit   Assessment:  1. Malignant neoplasm of upper-outer quadrant of right breast in female, estrogen receptor positive ([place])   2. Personal history of colonic polyps   3. Lipid screening   4. Vitamin D deficiency     Plan:  1. Refer back to oncology- needs yearly follow-up until oncology releases her; 2. Refer back to GI- she was due for colonoscopy in 2019; doubt it needs to be repeated but will let GI make final decision; 3. Check lipid panel; 4. Check Vitamin D level;  Age appropriate preventive healthcare needs addressed; encouraged regular eye doctor and dental exams; encouraged regular exercise; will update labs and refills as needed today; follow-up to be determined;  Time spent 30 minutes discussing health needs and preventive health care goals;  This visit occurred during the SARS-CoV-2 public health emergency.  Safety protocols were in place, including screening questions prior to the visit, additional usage of staff PPE, and extensive cleaning of exam room while observing appropriate contact time as indicated for disinfecting solutions.      No follow-ups on file.  Orders Placed This Encounter  Procedures  . CBC with Differential/Platelet    Standing Status:   Future    Number of Occurrences:   1    Standing Expiration Date:   05/11/2021  . Comp Met (CMET)    Standing Status:   Future    Number of Occurrences:   1    Standing Expiration Date:    05/11/2021  . Lipid panel    Standing Status:   Future    Number of Occurrences:   1    Standing Expiration Date:   05/11/2021  . Vitamin D (25 hydroxy)    Standing Status:   Future    Number of Occurrences:   1    Standing Expiration Date:   05/11/2021  . Ambulatory referral to Oncology    Referral Priority:   Routine    Referral Type:   Consultation    Referral Reason:   Specialty Services Required    Referred to Provider:   Nicholas Lose, MD    Number of Visits Requested:   1  . Ambulatory referral to Gastroenterology    Referral Priority:   Routine    Referral Type:   Consultation    Referral Reason:   Specialty Services Required    Referred to Provider:   Jerene Bears, MD    Number of Visits Requested:   1    Requested Prescriptions    No prescriptions requested or ordered in this encounter

## 2020-05-11 NOTE — Telephone Encounter (Signed)
Scheduled per 2/22 staff msg. Called and spoke with pt, confirmed 3/16 appt

## 2020-05-12 ENCOUNTER — Other Ambulatory Visit: Payer: Self-pay | Admitting: Family

## 2020-05-12 DIAGNOSIS — R899 Unspecified abnormal finding in specimens from other organs, systems and tissues: Secondary | ICD-10-CM

## 2020-05-27 ENCOUNTER — Other Ambulatory Visit: Payer: Federal, State, Local not specified - PPO

## 2020-06-01 NOTE — Progress Notes (Signed)
Patient Care Team: Marrian Salvage, [place] as PCP - General (Internal Medicine)  DIAGNOSIS:    ICD-10-CM   1. Malignant neoplasm of upper-outer quadrant of right breast in female, estrogen receptor positive ([place])  C50.411    Z17.0     SUMMARY OF ONCOLOGIC HISTORY: Oncology History  Malignant neoplasm of upper-outer quadrant of right breast in female, estrogen receptor positive ([place])  12/27/2016 Initial Diagnosis   Screening detected right breast mass 7 mm at 9o clock position, axilla negative, biopsy: IDC grade 2 with DCIS ER 100%, PR 100%, Ki-67 15%,HER-2 negative ratio 1.48 T1b N0 stage IA clinical stage   01/19/2017 Surgery   Right lumpectomy: IDC grade 2, 1 cm, with high-grade DCIS, margins negative, ER 100%, PR 100%, HER-2 negative, Ki-67 15%, T1BNX stage I a   02/2017 -  Anti-estrogen oral therapy   Anastrozole daily     CHIEF COMPLIANT: Follow-up of right breast cancer on anastrozole therapy  INTERVAL HISTORY: Alexandria Randolph is a 86 y.o. with above-mentioned history of right breast cancer treated with lumpectomy and who is on anti-estrogen therapy with anastrozole. She presents to the clinic today for annual follow-up.   She denies any lumps or nodules in the breast.  Denies any side effects from anastrozole therapy.  ALLERGIES:  is allergic to losartan potassium, olmesartan medoxomil, and simvastatin.  MEDICATIONS:  Current Outpatient Medications  Medication Sig Dispense Refill   Calcium Citrate-Vitamin D (CITRACAL + D PO) Take by mouth daily. (Patient not taking: Reported on 05/11/2020)     letrozole (FEMARA) 2.5 MG tablet Take 1 tablet (2.5 mg total) by mouth daily. 90 tablet 3   No current facility-administered medications for this visit.    PHYSICAL EXAMINATION: ECOG PERFORMANCE STATUS: 1 - Symptomatic but completely ambulatory  Vitals:   06/02/20 0938  BP: (!) 151/76  Pulse: (!) 102  Resp: 19  Temp: (!) 97.5 F (36.4 C)  SpO2: 100%   Filed  Weights   06/02/20 0938  Weight: 138 lb 8 oz (62.8 kg)    BREAST: No palpable masses or nodules in either right or left breasts. No palpable axillary supraclavicular or infraclavicular adenopathy no breast tenderness or nipple discharge. (exam performed in the presence of a chaperone)  LABORATORY DATA:  I have reviewed the data as listed CMP Latest Ref Rng & Units 05/11/2020 04/03/2018 01/03/2017  Glucose 70 - 99 mg/dL 100(H) 102(H) 124  BUN 6 - 23 mg/dL 12 9 8.4  Creatinine 0.40 - 1.20 mg/dL 0.96 0.97 0.9  Sodium 135 - 145 mEq/L 139 140 143  Potassium 3.5 - 5.1 mEq/L 4.5 3.5 3.5  Chloride 96 - 112 mEq/L 103 105 -  CO2 19 - 32 mEq/L 25 24 21(L)  Calcium 8.4 - 10.5 mg/dL 9.8 9.7 9.6  Total Protein 6.0 - 8.3 g/dL 7.2 7.5 7.5  Total Bilirubin 0.2 - 1.2 mg/dL 1.4(H) 0.9 0.88  Alkaline Phos 39 - 117 U/L 97 94 101  AST 0 - 37 U/L _0 ALT 0 - 35 U/L _1 Lab Results  Component Value Date   WBC 5.0 05/11/2020   HGB 14.2 05/11/2020   HCT 42.8 05/11/2020   MCV 92.3 05/11/2020   PLT 254.0 05/11/2020   NEUTROABS 3.4 05/11/2020    ASSESSMENT & PLAN:  Malignant neoplasm of upper-outer quadrant of right breast in female, estrogen receptor positive ([place]) 01/19/2017 right lumpectomy: IDC grade 2, 1 cm, with high-grade DCIS, margins negative, ER  100%, PR 100%, HER-2 negative, Ki-67 15%, T1BNX stage I a  Recommendation: 1.Radiation oncology determined that she does not need adjuvant radiationprimarily because of her elderly age. 2. Adjuvant antiestrogen therapy with anastrozole 1 mg dailyX5 yearsstartedDecember 2018  Anastrozole toxicities: Patient is experiencing decreased appetite and attributes this to anastrozole therapy.   Breast cancer surveillance: 1.Breast exam: 06/01/20: Benign 2.mammogram:at [facility] 05/04/20: Benign breast density category B 3.Bone density test 12/25/2017: T score -1.5 mild osteopenia Return to clinic in1 yrforfollow-up.    No  orders of the defined types were placed in this encounter.  The patient has a good understanding of the overall plan. she agrees with it. she will call with any problems that may develop before the next visit here.  Total time spent: 20 mins including face to face time and time spent for planning, charting and coordination of care  Rulon Eisenmenger, MD, MPH 06/02/2020  I, Cloyde Reams Dorshimer, am acting as scribe for Dr. Nicholas Lose.  I have reviewed the above documentation for accuracy and completeness, and I agree with the above.

## 2020-06-01 NOTE — Assessment & Plan Note (Signed)
01/19/2017 right lumpectomy: IDC grade 2, 1 cm, with high-grade DCIS, margins negative, ER 100%, PR 100%, HER-2 negative, Ki-67 15%, T1BNX stage I a  Recommendation: 1.Radiation oncology determined that she does not need adjuvant radiationprimarily because of her elderly age. 2. Adjuvant antiestrogen therapy with anastrozole 1 mg dailyX5 yearsstartedDecember 2018  Anastrozole toxicities: Patient is experiencing decreased appetite and attributes this to anastrozole therapy. I instructed her to stop anastrozole for couple of weeks and assess. If it is truly anastrozole related then she will call us back so we can switch her to letrozole.  Breast cancer surveillance: 1.Breast exam: 06/01/20: Benign 2.mammogram:at [facility] 05/04/20: Benign breast density category B 3.Bone density test 12/25/2017: T score -1.5 mild osteopenia Return to clinic in1 yrforfollow-up.

## 2020-06-02 ENCOUNTER — Other Ambulatory Visit (INDEPENDENT_AMBULATORY_CARE_PROVIDER_SITE_OTHER): Payer: Medicare Other

## 2020-06-02 ENCOUNTER — Other Ambulatory Visit: Payer: Self-pay

## 2020-06-02 ENCOUNTER — Inpatient Hospital Stay: Payer: Medicare Other | Attending: Hematology and Oncology | Admitting: Hematology and Oncology

## 2020-06-02 DIAGNOSIS — R899 Unspecified abnormal finding in specimens from other organs, systems and tissues: Secondary | ICD-10-CM

## 2020-06-02 DIAGNOSIS — Z79811 Long term (current) use of aromatase inhibitors: Secondary | ICD-10-CM | POA: Diagnosis not present

## 2020-06-02 DIAGNOSIS — Z170 Estrogen receptor positive status [ER+]: Secondary | ICD-10-CM | POA: Insufficient documentation

## 2020-06-02 DIAGNOSIS — C50411 Malignant neoplasm of upper-outer quadrant of right female breast: Secondary | ICD-10-CM | POA: Insufficient documentation

## 2020-06-02 LAB — BILIRUBIN, TOTAL: Total Bilirubin: 1 mg/dL (ref 0.2–1.2)

## 2020-06-02 MED ORDER — LETROZOLE 2.5 MG PO TABS: 2.5000 mg | ORAL_TABLET | Freq: Every day | ORAL | 3 refills | Status: DC

## 2020-06-03 ENCOUNTER — Telehealth: Payer: Self-pay | Admitting: Hematology and Oncology

## 2020-06-03 NOTE — Telephone Encounter (Signed)
Scheduled per 3/16 los. Called and spoke with pt, confirmed 3/16 appt

## 2020-08-21 ENCOUNTER — Encounter (HOSPITAL_COMMUNITY): Payer: Self-pay | Admitting: Emergency Medicine

## 2020-08-21 ENCOUNTER — Inpatient Hospital Stay (HOSPITAL_COMMUNITY)
Admission: EM | Admit: 2020-08-21 | Discharge: 2020-09-03 | DRG: 266 | Disposition: A | Discharge status: Home Health | Payer: Medicare Other | Attending: Internal Medicine | Admitting: Internal Medicine

## 2020-08-21 ENCOUNTER — Emergency Department (HOSPITAL_COMMUNITY): Payer: Medicare Other

## 2020-08-21 ENCOUNTER — Other Ambulatory Visit: Payer: Self-pay

## 2020-08-21 DIAGNOSIS — Z803 Family history of malignant neoplasm of breast: Secondary | ICD-10-CM | POA: Diagnosis not present

## 2020-08-21 DIAGNOSIS — Z01818 Encounter for other preprocedural examination: Secondary | ICD-10-CM

## 2020-08-21 DIAGNOSIS — E785 Hyperlipidemia, unspecified: Secondary | ICD-10-CM | POA: Diagnosis not present

## 2020-08-21 DIAGNOSIS — Z170 Estrogen receptor positive status [ER+]: Secondary | ICD-10-CM | POA: Diagnosis not present

## 2020-08-21 DIAGNOSIS — I5041 Acute combined systolic (congestive) and diastolic (congestive) heart failure: Secondary | ICD-10-CM | POA: Diagnosis not present

## 2020-08-21 DIAGNOSIS — I2510 Atherosclerotic heart disease of native coronary artery without angina pectoris: Secondary | ICD-10-CM | POA: Diagnosis present

## 2020-08-21 DIAGNOSIS — E876 Hypokalemia: Secondary | ICD-10-CM | POA: Diagnosis not present

## 2020-08-21 DIAGNOSIS — Z9079 Acquired absence of other genital organ(s): Secondary | ICD-10-CM | POA: Diagnosis not present

## 2020-08-21 DIAGNOSIS — R918 Other nonspecific abnormal finding of lung field: Secondary | ICD-10-CM | POA: Diagnosis not present

## 2020-08-21 DIAGNOSIS — Z7982 Long term (current) use of aspirin: Secondary | ICD-10-CM

## 2020-08-21 DIAGNOSIS — I5023 Acute on chronic systolic (congestive) heart failure: Secondary | ICD-10-CM | POA: Diagnosis present

## 2020-08-21 DIAGNOSIS — I110 Hypertensive heart disease with heart failure: Secondary | ICD-10-CM | POA: Diagnosis not present

## 2020-08-21 DIAGNOSIS — M810 Age-related osteoporosis without current pathological fracture: Secondary | ICD-10-CM | POA: Diagnosis present

## 2020-08-21 DIAGNOSIS — Z66 Do not resuscitate: Secondary | ICD-10-CM | POA: Diagnosis not present

## 2020-08-21 DIAGNOSIS — Z952 Presence of prosthetic heart valve: Secondary | ICD-10-CM | POA: Diagnosis not present

## 2020-08-21 DIAGNOSIS — Z90710 Acquired absence of both cervix and uterus: Secondary | ICD-10-CM

## 2020-08-21 DIAGNOSIS — I428 Other cardiomyopathies: Secondary | ICD-10-CM | POA: Diagnosis not present

## 2020-08-21 DIAGNOSIS — Z79899 Other long term (current) drug therapy: Secondary | ICD-10-CM

## 2020-08-21 DIAGNOSIS — I5030 Unspecified diastolic (congestive) heart failure: Secondary | ICD-10-CM | POA: Diagnosis not present

## 2020-08-21 DIAGNOSIS — J189 Pneumonia, unspecified organism: Secondary | ICD-10-CM | POA: Diagnosis not present

## 2020-08-21 DIAGNOSIS — R000 Tachycardia, unspecified: Secondary | ICD-10-CM | POA: Diagnosis not present

## 2020-08-21 DIAGNOSIS — Z87891 Personal history of nicotine dependence: Secondary | ICD-10-CM | POA: Diagnosis not present

## 2020-08-21 DIAGNOSIS — I509 Heart failure, unspecified: Secondary | ICD-10-CM

## 2020-08-21 DIAGNOSIS — Z888 Allergy status to other drugs, medicaments and biological substances status: Secondary | ICD-10-CM

## 2020-08-21 DIAGNOSIS — I248 Other forms of acute ischemic heart disease: Secondary | ICD-10-CM | POA: Diagnosis not present

## 2020-08-21 DIAGNOSIS — Z006 Encounter for examination for normal comparison and control in clinical research program: Secondary | ICD-10-CM | POA: Diagnosis not present

## 2020-08-21 DIAGNOSIS — I700 Atherosclerosis of aorta: Secondary | ICD-10-CM | POA: Diagnosis not present

## 2020-08-21 DIAGNOSIS — Z7902 Long term (current) use of antithrombotics/antiplatelets: Secondary | ICD-10-CM

## 2020-08-21 DIAGNOSIS — I959 Hypotension, unspecified: Secondary | ICD-10-CM | POA: Diagnosis not present

## 2020-08-21 DIAGNOSIS — K449 Diaphragmatic hernia without obstruction or gangrene: Secondary | ICD-10-CM | POA: Diagnosis not present

## 2020-08-21 DIAGNOSIS — J302 Other seasonal allergic rhinitis: Secondary | ICD-10-CM | POA: Diagnosis present

## 2020-08-21 DIAGNOSIS — R0602 Shortness of breath: Secondary | ICD-10-CM | POA: Diagnosis not present

## 2020-08-21 DIAGNOSIS — J181 Lobar pneumonia, unspecified organism: Secondary | ICD-10-CM | POA: Diagnosis not present

## 2020-08-21 DIAGNOSIS — I480 Paroxysmal atrial fibrillation: Secondary | ICD-10-CM | POA: Diagnosis not present

## 2020-08-21 DIAGNOSIS — Z20822 Contact with and (suspected) exposure to covid-19: Secondary | ICD-10-CM | POA: Diagnosis not present

## 2020-08-21 DIAGNOSIS — R790 Abnormal level of blood mineral: Secondary | ICD-10-CM | POA: Diagnosis not present

## 2020-08-21 DIAGNOSIS — Z79811 Long term (current) use of aromatase inhibitors: Secondary | ICD-10-CM | POA: Diagnosis not present

## 2020-08-21 DIAGNOSIS — I5043 Acute on chronic combined systolic (congestive) and diastolic (congestive) heart failure: Secondary | ICD-10-CM | POA: Diagnosis not present

## 2020-08-21 DIAGNOSIS — I350 Nonrheumatic aortic (valve) stenosis: Secondary | ICD-10-CM

## 2020-08-21 DIAGNOSIS — R7989 Other specified abnormal findings of blood chemistry: Secondary | ICD-10-CM | POA: Diagnosis not present

## 2020-08-21 DIAGNOSIS — K5730 Diverticulosis of large intestine without perforation or abscess without bleeding: Secondary | ICD-10-CM | POA: Diagnosis not present

## 2020-08-21 DIAGNOSIS — I517 Cardiomegaly: Secondary | ICD-10-CM | POA: Diagnosis not present

## 2020-08-21 DIAGNOSIS — J432 Centrilobular emphysema: Secondary | ICD-10-CM | POA: Diagnosis not present

## 2020-08-21 DIAGNOSIS — J90 Pleural effusion, not elsewhere classified: Secondary | ICD-10-CM | POA: Diagnosis not present

## 2020-08-21 DIAGNOSIS — Z90722 Acquired absence of ovaries, bilateral: Secondary | ICD-10-CM

## 2020-08-21 DIAGNOSIS — I5021 Acute systolic (congestive) heart failure: Secondary | ICD-10-CM | POA: Diagnosis not present

## 2020-08-21 DIAGNOSIS — C50411 Malignant neoplasm of upper-outer quadrant of right female breast: Secondary | ICD-10-CM | POA: Diagnosis present

## 2020-08-21 HISTORY — DX: Nonrheumatic aortic (valve) stenosis: I35.0

## 2020-08-21 LAB — CBC WITH DIFFERENTIAL/PLATELET
Abs Immature Granulocytes: 0.02 10*3/uL (ref 0.00–0.07)
Basophils Absolute: 0.1 10*3/uL (ref 0.0–0.1)
Basophils Relative: 1 %
Eosinophils Absolute: 0 10*3/uL (ref 0.0–0.5)
Eosinophils Relative: 0 %
HCT: 40.6 % (ref 36.0–46.0)
Hemoglobin: 13.4 g/dL (ref 12.0–15.0)
Immature Granulocytes: 0 %
Lymphocytes Relative: 16 %
Lymphs Abs: 0.9 10*3/uL (ref 0.7–4.0)
MCH: 30 pg (ref 26.0–34.0)
MCHC: 33 g/dL (ref 30.0–36.0)
MCV: 91 fL (ref 80.0–100.0)
Monocytes Absolute: 0.6 10*3/uL (ref 0.1–1.0)
Monocytes Relative: 11 %
Neutro Abs: 4.2 10*3/uL (ref 1.7–7.7)
Neutrophils Relative %: 72 %
Platelets: 253 10*3/uL (ref 150–400)
RBC: 4.46 MIL/uL (ref 3.87–5.11)
RDW: 12.2 % (ref 11.5–15.5)
WBC: 5.9 10*3/uL (ref 4.0–10.5)
nRBC: 0 % (ref 0.0–0.2)

## 2020-08-21 LAB — LIPID PANEL
Cholesterol: 180 mg/dL (ref 0–200)
HDL: 45 mg/dL (ref >40)
LDL Cholesterol: 117 mg/dL — ABNORMAL HIGH (ref 0–99)
Total CHOL/HDL Ratio: 4 RATIO
Triglycerides: 90 mg/dL (ref <150)
VLDL: 18 mg/dL (ref 0–40)

## 2020-08-21 LAB — URINALYSIS, ROUTINE W REFLEX MICROSCOPIC
Bacteria, UA: NONE SEEN
Bilirubin Urine: NEGATIVE
Glucose, UA: NEGATIVE mg/dL
Hgb urine dipstick: NEGATIVE
Ketones, ur: NEGATIVE mg/dL
Nitrite: NEGATIVE
Protein, ur: NEGATIVE mg/dL
Specific Gravity, Urine: 1.027 (ref 1.005–1.030)
pH: 5 (ref 5.0–8.0)

## 2020-08-21 LAB — COMPREHENSIVE METABOLIC PANEL
ALT: 50 U/L — ABNORMAL HIGH (ref 0–44)
AST: 37 U/L (ref 15–41)
Albumin: 4 g/dL (ref 3.5–5.0)
Alkaline Phosphatase: 95 U/L (ref 38–126)
Anion gap: 14 (ref 5–15)
BUN: 22 mg/dL (ref 8–23)
CO2: 19 mmol/L — ABNORMAL LOW (ref 22–32)
Calcium: 9.6 mg/dL (ref 8.9–10.3)
Chloride: 107 mmol/L (ref 98–111)
Creatinine, Ser: 1.19 mg/dL — ABNORMAL HIGH (ref 0.44–1.00)
GFR, Estimated: 45 mL/min — ABNORMAL LOW (ref >60)
Glucose, Bld: 148 mg/dL — ABNORMAL HIGH (ref 70–99)
Potassium: 4.1 mmol/L (ref 3.5–5.1)
Sodium: 140 mmol/L (ref 135–145)
Total Bilirubin: 1.2 mg/dL (ref 0.3–1.2)
Total Protein: 7.4 g/dL (ref 6.5–8.1)

## 2020-08-21 LAB — BRAIN NATRIURETIC PEPTIDE: B Natriuretic Peptide: 2105 pg/mL — ABNORMAL HIGH (ref 0.0–100.0)

## 2020-08-21 LAB — TROPONIN I (HIGH SENSITIVITY)
Troponin I (High Sensitivity): 372 ng/L (ref <18)
Troponin I (High Sensitivity): 389 ng/L (ref <18)

## 2020-08-21 LAB — D-DIMER, QUANTITATIVE: D-Dimer, Quant: 1.17 ug/mL-FEU — ABNORMAL HIGH (ref 0.00–0.50)

## 2020-08-21 LAB — TSH: TSH: 2.383 u[IU]/mL (ref 0.350–4.500)

## 2020-08-21 LAB — RESP PANEL BY RT-PCR (FLU A&B, COVID) ARPGX2
Influenza A by PCR: NEGATIVE
Influenza B by PCR: NEGATIVE
SARS Coronavirus 2 by RT PCR: NEGATIVE

## 2020-08-21 MED ORDER — ACETAMINOPHEN 650 MG RE SUPP: 650.0000 mg | Freq: Four times a day (QID) | RECTAL | Status: DC | PRN

## 2020-08-21 MED ORDER — SODIUM CHLORIDE (PF) 0.9 % IJ SOLN
INTRAMUSCULAR | Status: AC
  Filled 2020-08-21: qty 50

## 2020-08-21 MED ORDER — ACETAMINOPHEN 325 MG PO TABS: 650.0000 mg | ORAL_TABLET | Freq: Four times a day (QID) | ORAL | Status: DC | PRN

## 2020-08-21 MED ORDER — SODIUM CHLORIDE 0.9 % IV BOLUS
1000.0000 mL | Freq: Once | INTRAVENOUS | Status: AC
  Administered 2020-08-21: 1000 mL via INTRAVENOUS

## 2020-08-21 MED ORDER — METOPROLOL TARTRATE 5 MG/5ML IV SOLN: 5.0000 mg | Freq: Four times a day (QID) | INTRAVENOUS | Status: DC | PRN

## 2020-08-21 MED ORDER — IOHEXOL 350 MG/ML SOLN
100.0000 mL | Freq: Once | INTRAVENOUS | Status: AC | PRN
  Administered 2020-08-21: 75 mL via INTRAVENOUS

## 2020-08-21 MED ORDER — HEPARIN (PORCINE) 25000 UT/250ML-% IV SOLN
700.0000 [IU]/h | INTRAVENOUS | Status: DC
  Administered 2020-08-21: 700 [IU]/h via INTRAVENOUS
  Filled 2020-08-21: qty 250

## 2020-08-21 MED ORDER — FUROSEMIDE 10 MG/ML IJ SOLN
20.0000 mg | Freq: Once | INTRAMUSCULAR | Status: AC
  Administered 2020-08-21: 20 mg via INTRAVENOUS
  Filled 2020-08-21: qty 4

## 2020-08-21 MED ORDER — SODIUM CHLORIDE 0.9% FLUSH
3.0000 mL | Freq: Two times a day (BID) | INTRAVENOUS | Status: DC
  Administered 2020-08-22 – 2020-08-31 (×5): 3 mL via INTRAVENOUS

## 2020-08-21 MED ORDER — ASPIRIN 81 MG PO CHEW
324.0000 mg | CHEWABLE_TABLET | Freq: Once | ORAL | Status: AC
  Administered 2020-08-21: 324 mg via ORAL
  Filled 2020-08-21: qty 4

## 2020-08-21 MED ORDER — POLYETHYLENE GLYCOL 3350 17 G PO PACK: 17.0000 g | PACK | Freq: Every day | ORAL | Status: DC | PRN

## 2020-08-21 MED ORDER — SODIUM CHLORIDE 0.9 % IV SOLN
1.0000 g | Freq: Once | INTRAVENOUS | Status: AC
  Administered 2020-08-21: 1 g via INTRAVENOUS
  Filled 2020-08-21: qty 10

## 2020-08-21 MED ORDER — ENOXAPARIN SODIUM 30 MG/0.3ML IJ SOSY
30.0000 mg | PREFILLED_SYRINGE | INTRAMUSCULAR | Status: DC
  Administered 2020-08-21: 30 mg via SUBCUTANEOUS
  Filled 2020-08-21: qty 0.3

## 2020-08-21 MED ORDER — FUROSEMIDE 10 MG/ML IJ SOLN
20.0000 mg | Freq: Two times a day (BID) | INTRAMUSCULAR | Status: DC
  Administered 2020-08-21 – 2020-08-22 (×2): 20 mg via INTRAVENOUS
  Filled 2020-08-21 (×2): qty 2

## 2020-08-21 MED ORDER — SODIUM CHLORIDE 0.9 % IV SOLN
500.0000 mg | Freq: Once | INTRAVENOUS | Status: DC
  Filled 2020-08-21: qty 500

## 2020-08-21 NOTE — ED Provider Notes (Signed)
[facility] DEPT Provider Note   CSN: [id number] Arrival date & time: 08/21/20  4503     History Chief Complaint  Patient presents with  . Shortness of Breath    Alexandria Randolph is a 86 y.o. female.  HPI Patient with a history of breast cancer 1 year ago, former smoker, presents with dyspnea for 1 week.  She notes that after a sneezing episode 1 week ago she developed dyspnea.  Since that time symptoms have persistent with some generalized weakness, but no pain.  No fever.  No vomiting.  No fall.  No relief with Allegra.    Past Medical History:  Diagnosis Date  . Allergy    SEASONAL  . Cataracts, bilateral   . DYSLIPIDEMIA 12/01/2006  . Environmental allergies    has had allergy test:grass,trees,plants,dust  . HYPERGLYCEMIA 12/01/2006  . HYPERTENSION 12/01/2006  . OSTEOPOROSIS 12/01/2006  . SMOKER 12/01/2006    Patient Active Problem List   Diagnosis Date Noted  . Malignant neoplasm of upper-outer quadrant of right breast in female, estrogen receptor positive ([place]) 01/02/2017  . Cough variant asthma 12/24/2014  . Asthma with exacerbation 12/07/2014  . Arthritis of right hip 02/18/2014  . Osteoarthritis of right knee 11/27/2012  . Wellness examination 09/14/2010  . Chronic rhinitis 09/14/2010  . HIP PAIN, RIGHT 06/22/2009  . Dyslipidemia 12/01/2006  . SMOKER 12/01/2006  . HTN (hypertension) 12/01/2006  . Osteoporosis 12/01/2006  . Hyperglycemia 12/01/2006    Past Surgical History:  Procedure Laterality Date  . ABDOMINAL HYSTERECTOMY    . BREAST LUMPECTOMY WITH RADIOACTIVE SEED LOCALIZATION Right 01/19/2017   Procedure: RIGHT BREAST LUMPECTOMY WITH RADIOACTIVE SEED LOCALIZATION;  Surgeon: Fanny Skates, MD;  Location: [facility];  Service: General;  Laterality: Right;  . COLONOSCOPY    . POLYPECTOMY    . TOTAL ABDOMINAL HYSTERECTOMY W/ BILATERAL SALPINGOOPHORECTOMY  1980     OB History   No obstetric history on  file.     Family History  Problem Relation Age of Onset  . Cancer Mother        "Throat" and colon cancer, both at an advanced age  . Diabetes Father   . Breast cancer Paternal Aunt   . Colon cancer Neg Hx     Social History   Tobacco Use  . Smoking status: Former Smoker    Types: Cigarettes    Quit date: 03/20/2000    Years since quitting: 20.4  . Smokeless tobacco: Never Used  . Tobacco comment: Pt states that she smoked off and on- "I don't know for how long"  Substance Use Topics  . Alcohol use: Yes    Alcohol/week: 0.0 standard drinks    Comment: occ  . Drug use: No    Home Medications Prior to Admission medications   Medication Sig Start Date End Date Taking? Authorizing Provider  Calcium Citrate-Vitamin D (CITRACAL + D PO) Take by mouth daily. Patient not taking: Reported on 05/11/2020    [provider]  letrozole [facility]) 2.5 MG tablet Take 1 tablet (2.5 mg total) by mouth daily. 06/02/20   Nicholas Lose, MD    Allergies    Losartan potassium, Olmesartan medoxomil, and Simvastatin  Review of Systems   Review of Systems  Constitutional:       Per HPI, otherwise negative  HENT:       Per HPI, otherwise negative  Respiratory:       Per HPI, otherwise negative  Cardiovascular:  Per HPI, otherwise negative  Gastrointestinal: Negative for vomiting.  Endocrine:       Negative aside from HPI  Genitourinary:       Neg aside from HPI   Musculoskeletal:       Per HPI, otherwise negative  Skin: Negative.   Neurological: Positive for weakness. Negative for syncope.    Physical Exam Updated Vital Signs BP 103/89   Pulse 92   Temp 98.3 F (36.8 C) (Oral)   Resp 20   Ht 4\' 11"  (1.499 m)   Wt 56.7 kg   SpO2 99%   BMI 25.25 kg/m   Physical Exam Vitals and nursing note reviewed.  Constitutional:      General: She is not in acute distress.    Appearance: She is well-developed.  HENT:     Head: Normocephalic and atraumatic.  Eyes:      Conjunctiva/sclera: Conjunctivae normal.  Cardiovascular:     Rate and Rhythm: Regular rhythm. Tachycardia present.  Pulmonary:     Effort: Tachypnea present.     Breath sounds: Decreased breath sounds present.  Abdominal:     General: There is no distension.  Musculoskeletal:     Right lower leg: No edema.     Left lower leg: No edema.  Skin:    General: Skin is warm and dry.  Neurological:     Mental Status: She is alert and oriented to person, place, and time.     Cranial Nerves: No cranial nerve deficit.     ED Results / Procedures / Treatments   Labs (all labs ordered are listed, but only abnormal results are displayed) Labs Reviewed  COMPREHENSIVE METABOLIC PANEL - Abnormal; Notable for the following components:      Result Value   CO2 19 (*)    Glucose, Bld 148 (*)    Creatinine, Ser 1.19 (*)    ALT 50 (*)    GFR, Estimated 45 (*)    All other components within normal limits  BRAIN NATRIURETIC PEPTIDE - Abnormal; Notable for the following components:   B Natriuretic Peptide 2,105.0 (*)    All other components within normal limits  D-DIMER, QUANTITATIVE - Abnormal; Notable for the following components:   D-Dimer, Quant 1.17 (*)    All other components within normal limits  RESP PANEL BY RT-PCR (FLU A&B, COVID) [id number]  CBC WITH DIFFERENTIAL/PLATELET  URINALYSIS, ROUTINE W REFLEX MICROSCOPIC    EKG EKG Interpretation  Date/Time:  Saturday August 21 2020 07:20:17 EDT Ventricular Rate:  112 PR Interval:  169 QRS Duration: 94 QT Interval:  339 QTC Calculation: 463 R Axis:   -10 Text Interpretation: Sinus tachycardia Ventricular premature complex T wave abnormality Abnormal ECG Confirmed by Carmin Muskrat [phone]) on 08/21/2020 7:55:28 AM   Radiology CT Angio Chest PE W/Cm &/Or Wo Cm  Result Date: 08/21/2020 CLINICAL DATA:  Shortness of breath and elevated D-dimer levels. EXAM: CT ANGIOGRAPHY CHEST WITH CONTRAST TECHNIQUE: Multidetector CT imaging of the chest was  performed using the standard protocol during bolus administration of intravenous contrast. Multiplanar CT image reconstructions and MIPs were obtained to evaluate the vascular anatomy. CONTRAST:  27mL OMNIPAQUE IOHEXOL 350 MG/ML SOLN COMPARISON:  Chest radiograph 08/21/2020 FINDINGS: Despite efforts by the technologist and patient, motion artifact is present on today's exam and could not be eliminated. This reduces exam sensitivity and specificity. Cardiovascular: No filling defect is identified in the pulmonary arterial tree to suggest pulmonary embolus. Coronary, aortic arch, and branch vessel atherosclerotic vascular disease. The  aorta and systemic vasculature are not opacified on today's exam which was optimized for pulmonary arterial opacification. Mild cardiomegaly.  Aortic valve calcification. Mediastinum/Nodes: Unremarkable Lungs/Pleura: Small bilateral pleural effusions. Airway thickening is present, suggesting bronchitis or reactive airways disease. Consolidation in the right lower lobe and to lesser extent posteriorly in the right upper lobe noted on images 42 through 83 of series 5. Upper Abdomen: Unremarkable Musculoskeletal: Severe left degenerative glenohumeral arthropathy. Thoracic spondylosis and thoracic kyphosis. Age indeterminate mild superior endplate compression fracture at L1. Review of the MIP images confirms the above findings. IMPRESSION: 1. No filling defect is identified in the pulmonary arterial tree to suggest pulmonary embolus. 2. Consolidation in the right lower lobe and to a lesser extent posteriorly in the right upper lobe concerning for multilobar pneumonia. Followup PA and lateral chest X-ray is recommended in 3-4 weeks following trial of antibiotic therapy to ensure resolution and exclude underlying malignancy. 3. Other imaging findings of potential clinical significance: Aortic Atherosclerosis (ICD10-I70.0). Coronary atherosclerosis. Aortic valve calcification. Mild  cardiomegaly. Small bilateral pleural effusions. Airway thickening is present, suggesting bronchitis or reactive airways disease. Severe left degenerative glenohumeral arthropathy. Age indeterminate mild superior endplate compression fracture at L1. Electronically Signed   By: Van Clines M.D.   On: 08/21/2020 12:55   DG Chest Port 1 View  Result Date: 08/21/2020 CLINICAL DATA:  Shortness of breath EXAM: PORTABLE CHEST 1 VIEW COMPARISON:  November 01, 2014 FINDINGS: There is no edema or airspace opacity. Heart is mildly enlarged with pulmonary vascularity normal. No adenopathy. There is degenerative change in the thoracic spine. Clips noted in right breast region. IMPRESSION: No edema or airspace opacity.  Heart mildly enlarged. Electronically Signed   By: Lowella Grip III M.D.   On: 08/21/2020 08:08    Procedures Procedures   Medications Ordered in ED Medications  furosemide (LASIX) injection 20 mg (has no administration in time range)  cefTRIAXone (ROCEPHIN) 1 g in sodium chloride 0.9 % 100 mL IVPB (has no administration in time range)  azithromycin (ZITHROMAX) 500 mg in sodium chloride 0.9 % 250 mL IVPB (has no administration in time range)  sodium chloride 0.9 % bolus 1,000 mL (1,000 mLs Intravenous Bolus 08/21/20 1059)  sodium chloride (PF) 0.9 % injection (  Given by Other 08/21/20 1303)  iohexol (OMNIPAQUE) 350 MG/ML injection 100 mL (75 mLs Intravenous Contrast Given 08/21/20 1221)    ED Course  I have reviewed the triage vital signs and the nursing notes.  Pertinent labs & imaging results that were available during my care of the patient were reviewed by me and considered in my medical decision making (see chart for details).      2:55 PM Patient has persistent tachypnea, increased work of breathing, and attempted ambulation result in substantial dyspnea. I reviewed the patient's x-ray, CT, labs with her female companion. Patient is COVID-negative, has no pulmonary embolism,  but does have multilobar pneumonia, and BNP is greater than 2000.  Patient has no history of congestive heart failure.  There is suspicion for multifactorial etiology for dyspnea with new CHF/pneumonia. Given the patient's advanced age, slight elevation in creatinine, the patient will require admission for diuresis, antibiotics, monitoring, management.  CURB 65- 4 MDM Rules/Calculators/A&P  MDM Number of Diagnoses or Management Options Community acquired pneumonia of right lower lobe of lung: new, needed workup Elevated brain natriuretic peptide (BNP) level: new, needed workup   Amount and/or Complexity of Data Reviewed Clinical lab tests: ordered and reviewed Tests in the radiology  section of CPT: ordered and reviewed Tests in the medicine section of CPT: reviewed and ordered Decide to obtain previous medical records or to obtain history from someone other than the patient: yes Review and summarize past medical records: yes Discuss the patient with other providers: yes Independent visualization of images, tracings, or specimens: yes  Risk of Complications, Morbidity, and/or Mortality Presenting problems: high Diagnostic procedures: high Management options: high  Critical Care Total time providing critical care: < 30 minutes  Patient Progress Patient progress: stable    Final Clinical Impression(s) / ED Diagnoses Final diagnoses:  Community acquired pneumonia of right lower lobe of lung  Elevated brain natriuretic peptide (BNP) level     Carmin Muskrat, MD 08/21/20 1505

## 2020-08-21 NOTE — H&P (Addendum)
History and Physical        Hospital Admission Note Date: 08/21/2020  Patient name: Alexandria Randolph Medical record number: [id number] Date of birth: 09/02/1934 Age: 86 y.o. Gender: female  PCP: Marrian Salvage, [place]  Patient coming from: home  At baseline, ambulates: independently  Chief Complaint    Chief Complaint  Patient presents with  . Shortness of Breath      HPI:   This is an 86 year old female with past medical history of breast cancer currently on letrozole, remote history of tobacco use who presented with 1 to 2 weeks of shortness of breath at rest and on exertion.  Believe that she had allergies initially as she sneezed and has been having shortness of breath ever since then with minimally productive sputum which has since resolved.  Also with generalized weakness but no pain.  No fever, chest pain, lower extremity edema, chills, nausea, vomiting, sick contacts or any other complaints.   ED Course: Afebrile, hemodynamically stable, on room air. Notable Labs: Sodium 140, K4.1, CO2 19, glucose 148, BUN 22, creatinine 1.19, WBC 5.9, D-dimer 1.17, BNP 2105, COVID-19 and flu negative. Notable Imaging: CTA chest-negative for PE, consolidation in the RLL and are UL concerning for multifocal pneumonia, small bilateral pleural effusions, mild cardiomegaly, possible bronchitis and other chronic changes. Patient received Lasix 20 mg IV x1, ceftriaxone, 1 L NS bolus.    Vitals:   08/21/20 1407 08/21/20 1500  BP: 103/89 112/85  Pulse: 92 (!) 102  Resp: 20 19  Temp:    SpO2: 99% 97%     Review of Systems:  Review of Systems  All other systems reviewed and are negative.   Medical/Social/Family History   Past Medical History: Past Medical History:  Diagnosis Date  . Allergy    SEASONAL  . Cataracts, bilateral   . DYSLIPIDEMIA 12/01/2006  . Environmental  allergies    has had allergy test:grass,trees,plants,dust  . HYPERGLYCEMIA 12/01/2006  . HYPERTENSION 12/01/2006  . OSTEOPOROSIS 12/01/2006  . SMOKER 12/01/2006    Past Surgical History:  Procedure Laterality Date  . ABDOMINAL HYSTERECTOMY    . BREAST LUMPECTOMY WITH RADIOACTIVE SEED LOCALIZATION Right 01/19/2017   Procedure: RIGHT BREAST LUMPECTOMY WITH RADIOACTIVE SEED LOCALIZATION;  Surgeon: Fanny Skates, MD;  Location: [place];  Service: General;  Laterality: Right;  . COLONOSCOPY    . POLYPECTOMY    . TOTAL ABDOMINAL HYSTERECTOMY W/ BILATERAL SALPINGOOPHORECTOMY  1980    Medications: Prior to Admission medications   Medication Sig Start Date End Date Taking? Authorizing Provider  Calcium Citrate-Vitamin D (CITRACAL + D PO) Take by mouth daily. Patient not taking: Reported on 05/11/2020    [provider]  letrozole [facility]) 2.5 MG tablet Take 1 tablet (2.5 mg total) by mouth daily. 06/02/20   Nicholas Lose, MD    Allergies:   Allergies  Allergen Reactions  . Losartan Potassium     REACTION: sob  . Olmesartan Medoxomil     REACTION: sob  . Simvastatin     REACTION: Malgias    Social History:  reports that she quit smoking about 20 years ago. Her smoking use included cigarettes. She has never used smokeless tobacco. She reports  current alcohol use. She reports that she does not use drugs.  Family History: Family History  Problem Relation Age of Onset  . Cancer Mother        "Throat" and colon cancer, both at an advanced age  . Diabetes Father   . Breast cancer Paternal Aunt   . Colon cancer Neg Hx      Objective   Physical Exam: Blood pressure 112/85, pulse (!) 102, temperature 98.3 F (36.8 C), temperature source Oral, resp. rate 19, height 4\' 11"  (1.499 m), weight 56.7 kg, SpO2 97 %.  Physical Exam Vitals and nursing note reviewed.  Constitutional:      Appearance: Normal appearance.  HENT:     Head: Normocephalic and  atraumatic.  Eyes:     Conjunctiva/sclera: Conjunctivae normal.  Cardiovascular:     Rate and Rhythm: Normal rate and regular rhythm.  Pulmonary:     Effort: Pulmonary effort is normal.     Breath sounds: Normal breath sounds.  Abdominal:     General: Abdomen is flat.     Palpations: Abdomen is soft.  Musculoskeletal:        General: No swelling or tenderness.  Skin:    Coloration: Skin is not jaundiced or pale.  Neurological:     Mental Status: She is alert. Mental status is at baseline.  Psychiatric:        Mood and Affect: Mood normal.        Behavior: Behavior normal.     LABS on Admission: I have personally reviewed all the labs and imaging below    Basic Metabolic Panel: Recent Labs  Lab 08/21/20 0725  NA 140  K 4.1  CL 107  CO2 19*  GLUCOSE 148*  BUN 22  CREATININE 1.19*  CALCIUM 9.6   Liver Function Tests: Recent Labs  Lab 08/21/20 0725  AST 37  ALT 50*  ALKPHOS 95  BILITOT 1.2  PROT 7.4  ALBUMIN 4.0   No results for input(s): LIPASE, AMYLASE in the last 168 hours. No results for input(s): AMMONIA in the last 168 hours. CBC: Recent Labs  Lab 08/21/20 0725  WBC 5.9  NEUTROABS 4.2  HGB 13.4  HCT 40.6  MCV 91.0  PLT 253   Cardiac Enzymes: No results for input(s): CKTOTAL, CKMB, CKMBINDEX, TROPONINI in the last 168 hours. BNP: Invalid input(s): POCBNP CBG: No results for input(s): GLUCAP in the last 168 hours.  Radiological Exams on Admission:  CT Angio Chest PE W/Cm &/Or Wo Cm  Result Date: 08/21/2020 CLINICAL DATA:  Shortness of breath and elevated D-dimer levels. EXAM: CT ANGIOGRAPHY CHEST WITH CONTRAST TECHNIQUE: Multidetector CT imaging of the chest was performed using the standard protocol during bolus administration of intravenous contrast. Multiplanar CT image reconstructions and MIPs were obtained to evaluate the vascular anatomy. CONTRAST:  54mL OMNIPAQUE IOHEXOL 350 MG/ML SOLN COMPARISON:  Chest radiograph 08/21/2020 FINDINGS:  Despite efforts by the technologist and patient, motion artifact is present on today's exam and could not be eliminated. This reduces exam sensitivity and specificity. Cardiovascular: No filling defect is identified in the pulmonary arterial tree to suggest pulmonary embolus. Coronary, aortic arch, and branch vessel atherosclerotic vascular disease. The aorta and systemic vasculature are not opacified on today's exam which was optimized for pulmonary arterial opacification. Mild cardiomegaly.  Aortic valve calcification. Mediastinum/Nodes: Unremarkable Lungs/Pleura: Small bilateral pleural effusions. Airway thickening is present, suggesting bronchitis or reactive airways disease. Consolidation in the right lower lobe and to lesser extent posteriorly in the  right upper lobe noted on images 42 through 83 of series 5. Upper Abdomen: Unremarkable Musculoskeletal: Severe left degenerative glenohumeral arthropathy. Thoracic spondylosis and thoracic kyphosis. Age indeterminate mild superior endplate compression fracture at L1. Review of the MIP images confirms the above findings. IMPRESSION: 1. No filling defect is identified in the pulmonary arterial tree to suggest pulmonary embolus. 2. Consolidation in the right lower lobe and to a lesser extent posteriorly in the right upper lobe concerning for multilobar pneumonia. Followup PA and lateral chest X-ray is recommended in 3-4 weeks following trial of antibiotic therapy to ensure resolution and exclude underlying malignancy. 3. Other imaging findings of potential clinical significance: Aortic Atherosclerosis (ICD10-I70.0). Coronary atherosclerosis. Aortic valve calcification. Mild cardiomegaly. Small bilateral pleural effusions. Airway thickening is present, suggesting bronchitis or reactive airways disease. Severe left degenerative glenohumeral arthropathy. Age indeterminate mild superior endplate compression fracture at L1. Electronically Signed   By: Van Clines  M.D.   On: 08/21/2020 12:55   DG Chest Port 1 View  Result Date: 08/21/2020 CLINICAL DATA:  Shortness of breath EXAM: PORTABLE CHEST 1 VIEW COMPARISON:  November 01, 2014 FINDINGS: There is no edema or airspace opacity. Heart is mildly enlarged with pulmonary vascularity normal. No adenopathy. There is degenerative change in the thoracic spine. Clips noted in right breast region. IMPRESSION: No edema or airspace opacity.  Heart mildly enlarged. Electronically Signed   By: Lowella Grip III M.D.   On: 08/21/2020 08:08      EKG: normal sinus rhythm, T wave inversions in lateral leads new compared to prior EKGs   A & P   Principal Problem:   Acute CHF (congestive heart failure) (HCC) Active Problems:   Malignant neoplasm of upper-outer quadrant of right breast in female, estrogen receptor positive (HCC)   Elevated d-dimer   1. Concern for newly diagnosed acute CHF a. BNP 2000+ b. New T wave inversions on lateral leads c. will check troponins i. Addendum: First troponin 372, will consult cardiology. Dr. Emilio Aspen paged. d. Continue Lasix 20 mg IV twice daily e. Echo f. Telemetry g. Hold letrozole h. Check HbA1c and Lipid panel for risk factor stratificaiton  2. Shortness of breath, likely secondary to CHF and unlikely pneumonia a. CT showing concerns for multifocal pneumonia though she is afebrile without leukocytosis b. Discontinue antibiotics and Monitor for infectious symptoms c. Treatment as above  3. Breast cancer a. Holding letrozole for now pending further cardiac work-up  4. Elevated D-dimer a. CTA chest negative for PE b. Bilateral lower extremity ultrasound    DVT prophylaxis: lovenox   Code Status: DNR  Diet: low sodium Family Communication: Admission, patients condition and plan of care including tests being ordered have been discussed with the patient who indicates understanding and agrees with the plan and Code Status. Patient's family at bedside was  updated  Disposition Plan: The appropriate patient status for this patient is INPATIENT. Inpatient status is judged to be reasonable and necessary in order to provide the required intensity of service to ensure the patient's safety. The patient's presenting symptoms, physical exam findings, and initial radiographic and laboratory data in the context of their chronic comorbidities is felt to place them at high risk for further clinical deterioration. Furthermore, it is not anticipated that the patient will be medically stable for discharge from the hospital within 2 midnights of admission. The following factors support the patient status of inpatient.   " The patient's presenting symptoms include shortness of breath. " The worrisome physical exam  findings include appeared short of breath but on RA. " The initial radiographic and laboratory data are worrisome because of elevated BNP and D dimer. " The chronic co-morbidities include breast cancer.   * I certify that at the point of admission it is my clinical judgment that the patient will require inpatient hospital care spanning beyond 2 midnights from the point of admission due to high intensity of service, high risk for further deterioration and high frequency of surveillance required.*   Status is: Inpatient  Remains inpatient appropriate because:Ongoing diagnostic testing needed not appropriate for outpatient work up   Dispo: The patient is from: Home              Anticipated d/c is to: Home              Patient currently is not medically stable to d/c.   Difficult to place patient No    Consultants  . none  Procedures  . none  Time Spent on Admission: 61 minutes    Harold Hedge, DO [facility]  08/21/2020, 5:12 PM

## 2020-08-21 NOTE — Plan of Care (Signed)
Care Plan initiated

## 2020-08-21 NOTE — ED Notes (Signed)
Lab called for update on D-dimer and BNP.

## 2020-08-21 NOTE — Progress Notes (Signed)
ANTICOAGULATION CONSULT NOTE - Initial Consult  Pharmacy Consult for Heparin Indication: chest pain/ACS  Allergies  Allergen Reactions  . Losartan Potassium     REACTION: sob  . Olmesartan Medoxomil     REACTION: sob  . Simvastatin     REACTION: Malgias    Patient Measurements: Height: 4\' 11"  (149.9 cm) Weight: 56.7 kg (125 lb) IBW/kg (Calculated) : 43.2 Heparin Dosing Weight: 55kg  Vital Signs: Temp: 98 F (36.7 C) (06/04 1827) Temp Source: Oral (06/04 1827) BP: 128/99 (06/04 1827) Pulse Rate: 107 (06/04 1827)  Labs: Recent Labs    08/21/20 0725 08/21/20 1558 08/21/20 1834  HGB 13.4  --   --   HCT 40.6  --   --   PLT 253  --   --   CREATININE 1.19*  --   --   TROPONINIHS  --  372* 389*    Estimated Creatinine Clearance: 27 mL/min (A) (by C-G formula based on SCr of 1.19 mg/dL (H)).   Medical History: Past Medical History:  Diagnosis Date  . Allergy    SEASONAL  . Cataracts, bilateral   . DYSLIPIDEMIA 12/01/2006  . Environmental allergies    has had allergy test:grass,trees,plants,dust  . HYPERGLYCEMIA 12/01/2006  . HYPERTENSION 12/01/2006  . OSTEOPOROSIS 12/01/2006  . SMOKER 12/01/2006    Medications:  Infusions:  . heparin      Assessment: 86 yo F who presented with shortness of breath & cough.  Noted to have multiple cardiovascular risk factors, EKG changes, elevated troponin & BNP and vascular disease noted on chest CT so ACS being included in differential.   Pharmacy consulted to dose heparin.  CBC WNL.  Not on anticoagulation PTA.  She received dose of SQ Lovenox 30mg  for VTE px at 2143.    Goal of Therapy:  Heparin level 0.3-0.7 units/ml Monitor platelets by anticoagulation protocol: Yes   Plan:  D/C SQ Lovenox Start IV heparin infusion at 700 units/hr.  No bolus since she just received Lovenox dose.  Check 8hr heparin level after infusion starts Daily heparin level & CBC while on heparin F/U cardiology recommendations  Netta Cedars PharmD, BCPS 08/21/2020,9:45 PM

## 2020-08-21 NOTE — Progress Notes (Signed)
Cardiology Moonlighter Note  Paged by admitting hospitalist at [facility] to discuss patient's case.  The patient is an 86 year old female with multiple cardiovascular risk factors including smoking, hypertension, hyperlipidemia who initially presented with shortness of breath and cough.  In the ED she was afebrile and hemodynamically stable.  Labs were remarkable for an elevated D-dimer, elevated BNP to 2105, COVID-negative.  A PE protocol CT scan was negative for PE but showed consolidation in the right lower and upper lobes concerning for multifocal pneumonia.  A troponin was elevated to 372 with follow-up of 389.  She was presumed to have new onset heart failure and pneumonia, given antibiotics and IV Lasix and admitted to the hospitalist service.  Her ECG reveals sinus tachycardia, septal Q waves, and ST depressions with T wave inversions in lateral and anterolateral precordial leads.  These were new compared to prior ECGs.  The patient notably denies any symptoms of chest pain currently.  I reviewed her CT scan, which reveals aortic valve calcification and scattered coronary calcifications suggestive of chronic coronary and valvular heart disease.  Given the patient's numerous risk factors for coronary disease, her elevated troponin, elevated BNP, and findings of vascular disease on her CT scan, it is of reasonable likelihood that acute coronary syndrome may be playing a role in her current presentation.  She was given 324 mg of aspirin and started on a heparin drip.  She will be seen in consultation with cardiology during the day tomorrow to determine the next steps in her plan.  Notably the patient is DNR.  She has an echocardiogram ordered for the morning.  Please reach out to the cardiology pager tonight with any acute issues or questions.  Marcie Mowers, MD Cardiology Fellow, PGY-8

## 2020-08-22 ENCOUNTER — Inpatient Hospital Stay (HOSPITAL_COMMUNITY): Payer: Medicare Other

## 2020-08-22 ENCOUNTER — Encounter (HOSPITAL_COMMUNITY): Payer: Self-pay | Admitting: Internal Medicine

## 2020-08-22 DIAGNOSIS — R0602 Shortness of breath: Secondary | ICD-10-CM

## 2020-08-22 DIAGNOSIS — I248 Other forms of acute ischemic heart disease: Secondary | ICD-10-CM

## 2020-08-22 DIAGNOSIS — I5030 Unspecified diastolic (congestive) heart failure: Secondary | ICD-10-CM

## 2020-08-22 DIAGNOSIS — I509 Heart failure, unspecified: Secondary | ICD-10-CM

## 2020-08-22 DIAGNOSIS — R7989 Other specified abnormal findings of blood chemistry: Secondary | ICD-10-CM

## 2020-08-22 LAB — ECHOCARDIOGRAM COMPLETE
AR max vel: 0.91 cm2
AV Area VTI: 0.82 cm2
AV Area mean vel: 0.84 cm2
AV Mean grad: 40.2 mmHg
AV Peak grad: 66.2 mmHg
Ao pk vel: 4.07 m/s
Area-P 1/2: 9.85 cm2
Calc EF: 18.4 %
Height: 59 in
S' Lateral: 3.8 cm
Single Plane A2C EF: 2 %
Single Plane A4C EF: -24 %
Weight: 2151.69 oz

## 2020-08-22 LAB — CBC
HCT: 41.1 % (ref 36.0–46.0)
Hemoglobin: 13 g/dL (ref 12.0–15.0)
MCH: 29.5 pg (ref 26.0–34.0)
MCHC: 31.6 g/dL (ref 30.0–36.0)
MCV: 93.4 fL (ref 80.0–100.0)
Platelets: 250 10*3/uL (ref 150–400)
RBC: 4.4 MIL/uL (ref 3.87–5.11)
RDW: 12.3 % (ref 11.5–15.5)
WBC: 6.1 10*3/uL (ref 4.0–10.5)
nRBC: 0 % (ref 0.0–0.2)

## 2020-08-22 LAB — BASIC METABOLIC PANEL
Anion gap: 11 (ref 5–15)
BUN: 22 mg/dL (ref 8–23)
CO2: 25 mmol/L (ref 22–32)
Calcium: 9.5 mg/dL (ref 8.9–10.3)
Chloride: 109 mmol/L (ref 98–111)
Creatinine, Ser: 1.01 mg/dL — ABNORMAL HIGH (ref 0.44–1.00)
GFR, Estimated: 55 mL/min — ABNORMAL LOW (ref >60)
Glucose, Bld: 89 mg/dL (ref 70–99)
Potassium: 4.7 mmol/L (ref 3.5–5.1)
Sodium: 145 mmol/L (ref 135–145)

## 2020-08-22 LAB — HEPARIN LEVEL (UNFRACTIONATED)
Heparin Unfractionated: 0.85 IU/mL — ABNORMAL HIGH (ref 0.30–0.70)
Heparin Unfractionated: 0.32 IU/mL (ref 0.30–0.70)

## 2020-08-22 LAB — PROCALCITONIN: Procalcitonin: 0.13 ng/mL

## 2020-08-22 MED ORDER — FUROSEMIDE 20 MG PO TABS: 20.0000 mg | ORAL_TABLET | Freq: Every day | ORAL | Status: DC

## 2020-08-22 MED ORDER — SODIUM CHLORIDE 0.9% FLUSH: 3.0000 mL | INTRAVENOUS | Status: DC | PRN

## 2020-08-22 MED ORDER — SODIUM CHLORIDE 0.9 % IV SOLN: 250.0000 mL | INTRAVENOUS | Status: DC | PRN

## 2020-08-22 MED ORDER — SODIUM CHLORIDE 0.9 % IV SOLN: INTRAVENOUS | Status: DC

## 2020-08-22 MED ORDER — HEPARIN (PORCINE) 25000 UT/250ML-% IV SOLN
650.0000 [IU]/h | INTRAVENOUS | Status: DC
  Administered 2020-08-22: 600 [IU]/h via INTRAVENOUS
  Filled 2020-08-22 (×2): qty 250

## 2020-08-22 MED ORDER — CEFDINIR 300 MG PO CAPS
300.0000 mg | ORAL_CAPSULE | Freq: Two times a day (BID) | ORAL | Status: DC
  Administered 2020-08-22 (×2): 300 mg via ORAL
  Filled 2020-08-22 (×2): qty 1

## 2020-08-22 MED ORDER — ASPIRIN 81 MG PO CHEW
81.0000 mg | CHEWABLE_TABLET | ORAL | Status: AC
  Administered 2020-08-23: 81 mg via ORAL
  Filled 2020-08-22: qty 1

## 2020-08-22 MED ORDER — AZITHROMYCIN 250 MG PO TABS
500.0000 mg | ORAL_TABLET | Freq: Every day | ORAL | Status: DC
  Administered 2020-08-22: 500 mg via ORAL
  Filled 2020-08-22: qty 2

## 2020-08-22 MED ORDER — SODIUM CHLORIDE 0.9% FLUSH
3.0000 mL | Freq: Two times a day (BID) | INTRAVENOUS | Status: DC
  Administered 2020-08-23 – 2020-08-31 (×4): 3 mL via INTRAVENOUS

## 2020-08-22 MED ORDER — PERFLUTREN LIPID MICROSPHERE
1.0000 mL | INTRAVENOUS | Status: AC | PRN
  Administered 2020-08-22: 2 mL via INTRAVENOUS
  Filled 2020-08-22: qty 10

## 2020-08-22 MED ORDER — FUROSEMIDE 10 MG/ML IJ SOLN: 20.0000 mg | Freq: Two times a day (BID) | INTRAMUSCULAR | Status: DC

## 2020-08-22 NOTE — Progress Notes (Signed)
  Echocardiogram 2D Echocardiogram has been performed.  Alexandria Randolph 08/22/2020, 2:45 PM

## 2020-08-22 NOTE — Progress Notes (Signed)
ANTICOAGULATION CONSULT NOTE - Initial Consult  Pharmacy Consult for Heparin Indication: chest pain/ACS  Allergies  Allergen Reactions  . Losartan Potassium     REACTION: sob  . Olmesartan Medoxomil     REACTION: sob  . Simvastatin     REACTION: Malgias    Patient Measurements: Height: 4\' 11"  (149.9 cm) Weight: 61 kg (134 lb 7.7 oz) IBW/kg (Calculated) : 43.2 Heparin Dosing Weight: 55kg  Vital Signs: Temp: 97.8 F (36.6 C) (06/05 0450) BP: 106/89 (06/05 0450) Pulse Rate: 95 (06/05 0450)  Labs: Recent Labs    08/21/20 0725 08/21/20 1558 08/21/20 1834 08/22/20 0501 08/22/20 0829  HGB 13.4  --   --  13.0  --   HCT 40.6  --   --  41.1  --   PLT 253  --   --  250  --   HEPARINUNFRC  --   --   --   --  0.85*  CREATININE 1.19*  --   --  1.01*  --   TROPONINIHS  --  372* 389*  --   --     Estimated Creatinine Clearance: 32.9 mL/min (A) (by C-G formula based on SCr of 1.01 mg/dL (H)).   Medical History: Past Medical History:  Diagnosis Date  . Allergy    SEASONAL  . Cataracts, bilateral   . DYSLIPIDEMIA 12/01/2006  . Environmental allergies    has had allergy test:grass,trees,plants,dust  . HYPERGLYCEMIA 12/01/2006  . HYPERTENSION 12/01/2006  . OSTEOPOROSIS 12/01/2006  . SMOKER 12/01/2006    Medications:  Infusions:  . heparin 600 Units/hr (08/22/20 1000)    Assessment: 86 yo F who presented with shortness of breath & cough.  Noted to have multiple cardiovascular risk factors, EKG changes, elevated troponin & BNP and vascular disease noted on chest CT so ACS being included in differential.   Pharmacy consulted to dose heparin.  CBC WNL.  Not on anticoagulation PTA.  She received dose of SQ Lovenox 30mg  for VTE px at 2143.    Today, 08/22/20: Heparin level SUPRA-therapeutic at 0.85 with heparin infusing at 700 units/hr Hgb stable at 13, PLT stable at 250 No bleeding or infusion related issues reported  Goal of Therapy:  Heparin level 0.3-0.7  units/ml Monitor platelets by anticoagulation protocol: Yes   Plan:  Decrease IV heparin infusion to 600 units/hr Check 8hr heparin level after rate change Daily heparin level & CBC while on heparin F/U cardiology recommendations  Efraim Kaufmann PharmD, BCPS 08/22/2020,11:16 AM

## 2020-08-22 NOTE — Progress Notes (Signed)
Pt transferring to [facility]  via care link. Pt made aware about the transfer. Report given to care link RN and Gardner Candle RN. Pt is in no acute distress at this time.

## 2020-08-22 NOTE — Progress Notes (Signed)
ANTICOAGULATION CONSULT NOTE - Initial Consult  Pharmacy Consult for Heparin Indication: chest pain/ACS  Allergies  Allergen Reactions  . Losartan Potassium     REACTION: sob  . Olmesartan Medoxomil     REACTION: sob  . Simvastatin     REACTION: Malgias    Patient Measurements: Height: 4\' 11"  (149.9 cm) Weight: 61 kg (134 lb 7.7 oz) IBW/kg (Calculated) : 43.2 Heparin Dosing Weight: 55kg  Vital Signs: Temp: 98 F (36.7 C) (06/05 1230) Temp Source: Oral (06/05 1230) BP: 98/71 (06/05 1230) Pulse Rate: 93 (06/05 1230)  Labs: Recent Labs    08/21/20 0725 08/21/20 1558 08/21/20 1834 08/22/20 0501 08/22/20 0829 08/22/20 1802  HGB 13.4  --   --  13.0  --   --   HCT 40.6  --   --  41.1  --   --   PLT 253  --   --  250  --   --   HEPARINUNFRC  --   --   --   --  0.85* 0.32  CREATININE 1.19*  --   --  1.01*  --   --   TROPONINIHS  --  372* 389*  --   --   --     Estimated Creatinine Clearance: 32.9 mL/min (A) (by C-G formula based on SCr of 1.01 mg/dL (H)).   Medical History: Past Medical History:  Diagnosis Date  . Allergy    SEASONAL  . Cataracts, bilateral   . DYSLIPIDEMIA 12/01/2006  . Environmental allergies    has had allergy test:grass,trees,plants,dust  . HYPERGLYCEMIA 12/01/2006  . HYPERTENSION 12/01/2006  . OSTEOPOROSIS 12/01/2006  . SMOKER 12/01/2006    Medications:  Infusions:  . sodium chloride    . [START ON 08/23/2020] sodium chloride    . heparin 600 Units/hr (08/22/20 1000)    Assessment: 86 yo F who presented with shortness of breath & cough.  Noted to have multiple cardiovascular risk factors, EKG changes, elevated troponin & BNP and vascular disease noted on chest CT so ACS being included in differential.   Pharmacy consulted to dose heparin.  CBC WNL.  Not on anticoagulation PTA.  She received dose of SQ Lovenox 30mg  for VTE px at 2143.    Today, 08/22/20: Heparin level therapeutic at 0.32 with heparin infusing at 600 units/hr Hgb stable  at 13, PLT stable at 250 No bleeding or infusion related issues reported  Goal of Therapy:  Heparin level 0.3-0.7 units/ml Monitor platelets by anticoagulation protocol: Yes   Plan:  Continue IV heparin infusion at 600 units/hr Check confirmatory 8hr heparin level Daily heparin level & CBC while on heparin F/U cardiology recommendations  Efraim Kaufmann PharmD, BCPS 08/22/2020,7:37 PM

## 2020-08-22 NOTE — Plan of Care (Signed)
  Problem: Education: Goal: Knowledge of General Education information will improve Description: Including pain rating scale, medication(s)/side effects and non-pharmacologic comfort measures Outcome: Progressing   Problem: Health Behavior/Discharge Planning: Goal: Ability to manage health-related needs will improve Outcome: Progressing   Problem: Clinical Measurements: Goal: Respiratory complications will improve Outcome: Progressing   Problem: Coping: Goal: Level of anxiety will decrease Outcome: Progressing   Problem: Pain Managment: Goal: General experience of comfort will improve Outcome: Progressing   

## 2020-08-22 NOTE — Consult Note (Addendum)
Cardiology Consultation:   Patient ID: Alexandria Randolph MRN: [id number]; DOB: 06-Aug-1934  Admit date: 08/21/2020 Date of Consult: 08/22/2020  PCP:  Marrian Salvage, [place] [facility] Cardiologist: New (Dr. Gardiner Rhyme  Patient Profile:   Alexandria Randolph is a 86 y.o. female with a history of hypertension, hyperlipidemia, hyperglycemia, breast cancer s/p lumpectomy with radioactive seed in 01/2017, and tobacco abuse who is being seen for the evaluation of CHF at the request of Dr. Posey Pronto.  History of Present Illness:   [name] is a 86 year old female with the above history. No known cardiac history. She does have a history of hypertension, hyperlipidemia, and hyperglycemia.  Patient presented to the [facility] ED for further evaluation of shortness of breath for the past 1-2 weeks. In the ED, patient tachypneic and hypotensive at times. O2 sats >90% on room air. EKG showed sinus tachycardia, rate 112 bpm, with PVC and T wave inversions in in lateral leads. High-sensitivity troponin elevated and flat at 372 >> 389. BNP markedly elevated at 2,105. Chest x-ray showed mild cardiomegaly with no overt edema. D-dimer elevated at 1.17. Chest CTA negative for PE but did show some consolidation in the right lower lobe and to a lesser extent posteriorly in the right upper lobe concerning for multilobar pneumonia. WBC 5.9, Hgb 13.4, Plts 253. Na 140, K 4.1, Glucose 148, BUN 22, Cr 1.19. Respiratory panel negative for COVID-19 and influenza A/B. Patient was started on IV Lasix as well as antibiotics. Cardiology consulted for further management of CHF.  She denies any chest pain.  Reports her dyspnea is mildly improved today.   Past Medical History:  Diagnosis Date  . Allergy    SEASONAL  . Cataracts, bilateral   . DYSLIPIDEMIA 12/01/2006  . Environmental allergies    has had allergy test:grass,trees,plants,dust  . HYPERGLYCEMIA 12/01/2006  . HYPERTENSION 12/01/2006  .  OSTEOPOROSIS 12/01/2006  . SMOKER 12/01/2006    Past Surgical History:  Procedure Laterality Date  . ABDOMINAL HYSTERECTOMY    . BREAST LUMPECTOMY WITH RADIOACTIVE SEED LOCALIZATION Right 01/19/2017   Procedure: RIGHT BREAST LUMPECTOMY WITH RADIOACTIVE SEED LOCALIZATION;  Surgeon: Fanny Skates, MD;  Location: [facility];  Service: General;  Laterality: Right;  . COLONOSCOPY    . POLYPECTOMY    . TOTAL ABDOMINAL HYSTERECTOMY W/ BILATERAL SALPINGOOPHORECTOMY  1980     Home Medications:  Prior to Admission medications   Medication Sig Start Date End Date Taking? Authorizing Provider  fexofenadine (ALLEGRA) 180 MG tablet Take 180 mg by mouth daily.   Yes [provider]  letrozole (FEMARA) 2.5 MG tablet Take 1 tablet (2.5 mg total) by mouth daily. 06/02/20  Yes Nicholas Lose, MD  Polyethyl Glycol-Propyl Glycol (SYSTANE FREE OP) Place 1 drop into both eyes daily as needed (For dry eyes).   Yes [provider]    Inpatient Medications: Scheduled Meds: . azithromycin  500 mg Oral Daily  . cefdinir  300 mg Oral Q12H  . furosemide  20 mg Intravenous BID  . sodium chloride flush  3 mL Intravenous Q12H   Continuous Infusions: . heparin 600 Units/hr (08/22/20 1000)   PRN Meds: acetaminophen **OR** acetaminophen, metoprolol tartrate, polyethylene glycol  Allergies:    Allergies  Allergen Reactions  . Losartan Potassium     REACTION: sob  . Olmesartan Medoxomil     REACTION: sob  . Simvastatin     REACTION: Malgias    Social History:   Social History  Socioeconomic History  . Marital status: Single    Spouse name: Not on file  . Number of children: Not on file  . Years of education: Not on file  . Highest education level: Not on file  Occupational History  . Occupation: Retired  Tobacco Use  . Smoking status: Former Smoker    Types: Cigarettes    Quit date: 03/20/2000    Years since quitting: 20.4  . Smokeless tobacco: Never Used  .  Tobacco comment: Pt states that she smoked off and on- "I don't know for how long"  Substance and Sexual Activity  . Alcohol use: Yes    Alcohol/week: 0.0 standard drinks    Comment: occ  . Drug use: No  . Sexual activity: Not on file  Other Topics Concern  . Not on file  Social History Narrative   Married   Social Determinants of Health   Financial Resource Strain: Not on file  Food Insecurity: Not on file  Transportation Needs: Not on file  Physical Activity: Not on file  Stress: Not on file  Social Connections: Not on file  Intimate Partner Violence: Not on file    Family History:   Family History  Problem Relation Age of Onset  . Cancer Mother        "Throat" and colon cancer, both at an advanced age  . Diabetes Father   . Breast cancer Paternal Aunt   . Colon cancer Neg Hx      ROS:  Please see the history of present illness.  All other ROS reviewed and negative.     Physical Exam/Data:   Vitals:   08/21/20 2256 08/22/20 0450 08/22/20 0500 08/22/20 1230  BP: 95/69 106/89  98/71  Pulse: 100 95  93  Resp: 18 18  20   Temp: 97.7 F (36.5 C) 97.8 F (36.6 C)  98 F (36.7 C)  TempSrc: Oral   Oral  SpO2: 98% 100%  100%  Weight:   61 kg   Height:        Intake/Output Summary (Last 24 hours) at 08/22/2020 1317 Last data filed at 08/22/2020 1040 Gross per 24 hour  Intake 294.07 ml  Output 1100 ml  Net -805.93 ml   Last 3 Weights 08/22/2020 08/21/2020 06/02/2020  Weight (lbs) 134 lb 7.7 oz 125 lb 138 lb 8 oz  Weight (kg) 61 kg 56.7 kg 62.823 kg     Body mass index is 27.16 kg/m.  General:  Well nourished, well developed, in no acute distress HEENT: normal Neck: + JVD Cardiac:  normal S1, S2; RRR; 2/6 systolic murmur Lungs: Bibasilar crackles Abd: soft, nontender, no hepatomegaly  Ext: no edema Musculoskeletal:  No deformities, BUE and BLE strength normal and equal Skin: warm and dry  Neuro:   no focal abnormalities noted Psych:  Normal affect   EKG:  The  EKG was personally reviewed and demonstrates:  Sinus tachycardia, rate 112 bpm, with PVC and T wave inversions in in lateral leads.  Telemetry:  Telemetry was personally reviewed and demonstrates: Normal sinus rhythm, rate 80s to 90s  Relevant CV Studies:  Lower Extremity Dopplers 08/22/2020 (Preliminary Read): Summary:  RIGHT:  - There is no evidence of deep vein thrombosis in the lower extremity.  - No cystic structure found in the popliteal fossa.    LEFT:  - There is no evidence of deep vein thrombosis in the lower extremity.  - No cystic structure found in the popliteal fossa.  _______________  Laboratory  Data:  High Sensitivity Troponin:   Recent Labs  Lab 08/21/20 1558 08/21/20 1834  TROPONINIHS 372* 389*     Chemistry Recent Labs  Lab 08/21/20 0725 08/22/20 0501  NA 140 145  K 4.1 4.7  CL 107 109  CO2 19* 25  GLUCOSE 148* 89  BUN 22 22  CREATININE 1.19* 1.01*  CALCIUM 9.6 9.5  GFRNONAA 45* 55*  ANIONGAP 14 11    Recent Labs  Lab 08/21/20 0725  PROT 7.4  ALBUMIN 4.0  AST 37  ALT 50*  ALKPHOS 95  BILITOT 1.2   Hematology Recent Labs  Lab 08/21/20 0725 08/22/20 0501  WBC 5.9 6.1  RBC 4.46 4.40  HGB 13.4 13.0  HCT 40.6 41.1  MCV 91.0 93.4  MCH 30.0 29.5  MCHC 33.0 31.6  RDW 12.2 12.3  PLT 253 250   BNP Recent Labs  Lab 08/21/20 0708  BNP 2,105.0*    DDimer  Recent Labs  Lab 08/21/20 0728  DDIMER 1.17*     Radiology/Studies:  CT Angio Chest PE W/Cm &/Or Wo Cm  Result Date: 08/21/2020 CLINICAL DATA:  Shortness of breath and elevated D-dimer levels. EXAM: CT ANGIOGRAPHY CHEST WITH CONTRAST TECHNIQUE: Multidetector CT imaging of the chest was performed using the standard protocol during bolus administration of intravenous contrast. Multiplanar CT image reconstructions and MIPs were obtained to evaluate the vascular anatomy. CONTRAST:  90mL OMNIPAQUE IOHEXOL 350 MG/ML SOLN COMPARISON:  Chest radiograph 08/21/2020 FINDINGS: Despite  efforts by the technologist and patient, motion artifact is present on today's exam and could not be eliminated. This reduces exam sensitivity and specificity. Cardiovascular: No filling defect is identified in the pulmonary arterial tree to suggest pulmonary embolus. Coronary, aortic arch, and branch vessel atherosclerotic vascular disease. The aorta and systemic vasculature are not opacified on today's exam which was optimized for pulmonary arterial opacification. Mild cardiomegaly.  Aortic valve calcification. Mediastinum/Nodes: Unremarkable Lungs/Pleura: Small bilateral pleural effusions. Airway thickening is present, suggesting bronchitis or reactive airways disease. Consolidation in the right lower lobe and to lesser extent posteriorly in the right upper lobe noted on images 42 through 83 of series 5. Upper Abdomen: Unremarkable Musculoskeletal: Severe left degenerative glenohumeral arthropathy. Thoracic spondylosis and thoracic kyphosis. Age indeterminate mild superior endplate compression fracture at L1. Review of the MIP images confirms the above findings. IMPRESSION: 1. No filling defect is identified in the pulmonary arterial tree to suggest pulmonary embolus. 2. Consolidation in the right lower lobe and to a lesser extent posteriorly in the right upper lobe concerning for multilobar pneumonia. Followup PA and lateral chest X-ray is recommended in 3-4 weeks following trial of antibiotic therapy to ensure resolution and exclude underlying malignancy. 3. Other imaging findings of potential clinical significance: Aortic Atherosclerosis (ICD10-I70.0). Coronary atherosclerosis. Aortic valve calcification. Mild cardiomegaly. Small bilateral pleural effusions. Airway thickening is present, suggesting bronchitis or reactive airways disease. Severe left degenerative glenohumeral arthropathy. Age indeterminate mild superior endplate compression fracture at L1. Electronically Signed   By: Van Clines M.D.    On: 08/21/2020 12:55   DG Chest Port 1 View  Result Date: 08/21/2020 CLINICAL DATA:  Shortness of breath EXAM: PORTABLE CHEST 1 VIEW COMPARISON:  November 01, 2014 FINDINGS: There is no edema or airspace opacity. Heart is mildly enlarged with pulmonary vascularity normal. No adenopathy. There is degenerative change in the thoracic spine. Clips noted in right breast region. IMPRESSION: No edema or airspace opacity.  Heart mildly enlarged. Electronically Signed   By: Lowella Grip III  M.D.   On: 08/21/2020 08:08   VAS [place] LOWER EXTREMITY VENOUS (DVT)  Result Date: 08/22/2020  Lower Venous DVT Study Patient Name:  DELPHINE SIZEMORE  Date of Exam:   08/22/2020 Medical Rec #: [id number]          Accession #:    [id number] Date of Birth: [birth year]           Patient Gender: F Patient Age:   86Y Exam Location:  [facility] Procedure:      VAS [place] LOWER EXTREMITY VENOUS (DVT) Referring Phys: [id number] Jerome --------------------------------------------------------------------------------  Indications: Elevated Ddimer.  Risk Factors: None identified. Comparison Study: No prior studies. Performing Technologist: Oliver Hum RVT  Examination Guidelines: A complete evaluation includes B-mode imaging, spectral Doppler, color Doppler, and power Doppler as needed of all accessible portions of each vessel. Bilateral testing is considered an integral part of a complete examination. Limited examinations for reoccurring indications may be performed as noted. The reflux portion of the exam is performed with the patient in reverse Trendelenburg.  +---------+---------------+---------+-----------+----------+--------------+ RIGHT    CompressibilityPhasicitySpontaneityPropertiesThrombus Aging +---------+---------------+---------+-----------+----------+--------------+ CFV      Full           Yes      Yes                                  +---------+---------------+---------+-----------+----------+--------------+ SFJ      Full                                                        +---------+---------------+---------+-----------+----------+--------------+ FV Prox  Full                                                        +---------+---------------+---------+-----------+----------+--------------+ FV Mid   Full                                                        +---------+---------------+---------+-----------+----------+--------------+ FV DistalFull                                                        +---------+---------------+---------+-----------+----------+--------------+ PFV      Full                                                        +---------+---------------+---------+-----------+----------+--------------+ POP      Full           Yes      Yes                                 +---------+---------------+---------+-----------+----------+--------------+  PTV      Full                                                        +---------+---------------+---------+-----------+----------+--------------+ PERO     Full                                                        +---------+---------------+---------+-----------+----------+--------------+   +---------+---------------+---------+-----------+----------+--------------+ LEFT     CompressibilityPhasicitySpontaneityPropertiesThrombus Aging +---------+---------------+---------+-----------+----------+--------------+ CFV      Full           Yes      Yes                                 +---------+---------------+---------+-----------+----------+--------------+ SFJ      Full                                                        +---------+---------------+---------+-----------+----------+--------------+ FV Prox  Full                                                         +---------+---------------+---------+-----------+----------+--------------+ FV Mid   Full                                                        +---------+---------------+---------+-----------+----------+--------------+ FV DistalFull                                                        +---------+---------------+---------+-----------+----------+--------------+ PFV      Full                                                        +---------+---------------+---------+-----------+----------+--------------+ POP      Full           Yes      Yes                                 +---------+---------------+---------+-----------+----------+--------------+ PTV      Full                                                        +---------+---------------+---------+-----------+----------+--------------+  PERO     Full                                                        +---------+---------------+---------+-----------+----------+--------------+     Summary: RIGHT: - There is no evidence of deep vein thrombosis in the lower extremity.  - No cystic structure found in the popliteal fossa.  LEFT: - There is no evidence of deep vein thrombosis in the lower extremity.  - No cystic structure found in the popliteal fossa.  *See table(s) above for measurements and observations.    Preliminary      Assessment and Plan:   New Onset Acute CHF: Patient presented with shortness of breath x1-2 weeks. BNP elevated at 2,105.   Chest x-ray showed mild cardiomegaly but no overt edema. Chest CTA concerning for possible multilobar pneumonia.  - Echocardiogram - On abx for pneumonia, suspect more likely CHF, as no fever and normal WBC.  Will check procalcitonin. - Continue IV lasix - Continue to monitor daily weights, strict I/O's, and renal function.  ADDENDUM: Echo shows severe LV systolic dysfunction (EF 50-93%) and severe AS (Vmax 4.3, MG 49, AVA 0.7, DI 0.26).  Recommend LHC/RHC to evaluate  hemodynamics and rule out obstructive CAD.  Will need evaluation for TAVR.  D/w Dr Posey Pronto, will transfer to [facility]. Risks and benefits of cardiac catheterization have been discussed with the patient.  These include bleeding, infection, kidney damage, stroke, heart attack, death.  The patient understands these risks and is willing to proceed.   Elevated Troponin: High-sensitivity troponin mildly elevated and flat at 372 >> 389.  EKG showed T wave inversions in lateral leads.  Given no chest pain and flat troponins, suspect demand ischemia from acute CHF.  However if EF is down, will need to consider ischemic evaluation -Follow-up echocardiogram -Can continue heparin drip x48 hours  Otherwise, per primary team: - Possible Pneumonia - Breast Cancer - Elevated D-dimer: CTA negative for PE and lower extremity dopplers negative for DVT    For questions or updates, please contact [facility] Please consult [email or web address] for contact info under    Signed, Donato Heinz, MD  08/22/2020 1:17 PM

## 2020-08-22 NOTE — Progress Notes (Addendum)
[facility] Progress Note  Patient: Alexandria Randolph    LHT:[id number]  DOA: 08/21/2020     Date of Service: the patient was seen and examined on 08/22/2020  Brief hospital course: Past medical history of breast cancer on letrozole, former smoker.  Presents with complaints of shortness of breath found to have acute on chronic likely diastolic CHF. Currently plan is continue diuresis.  Assessment and Plan: 1.  Acute CHF.  Unspecified. BNP elevated at 2000. Chest x-ray shows cardiomegaly. Patient presented with shortness of breath. Chest x-ray shows congestion. CT scan shows no evidence of PE. Cardiology consulted. Echocardiogram currently pending. Continue IV Lasix. Daily weight and ins and outs. Addendum: Received call from cardiology.  Patient's echocardiogram shows poor EF as well as critical aortic stenosis.  Patient will require further work-up including right and left cardiac catheterization.  Potentially will also require structural heart consultation.  For this patient will be transferred to [facility]. Cardiology will continue to follow as consultant.  Berle Mull 6:44 PM 08/22/2020    2.  Elevated troponin. Less likely ACS most likely this is demand ischemia. Echocardiogram currently pending. Started on heparin last night which we will continue for 48 hours. [place] cardiology consultation.  3.  Concern for pneumonia. CT scan shows evidence of right-sided pneumonia. Patient does not have any fever or leukocytosis. Given the presentation we will initiate antibiotic. Monitor.  4.  History of breast cancer. Currently on letrozole.  On hold Will monitor.  5.  Elevated D-dimer. CT PE negative. Lower extremity Doppler negative for DVT as well. Monitor.  Diet: Cardiac diet DVT Prophylaxis: Therapeutic heparin   Advance goals of care discussion: DNR  Family Communication: family was present at bedside, at the time of interview.  The pt provided  permission to discuss medical plan with the family. Opportunity was given to ask question and all questions were answered satisfactorily.   Disposition:  Status is: Inpatient  Remains inpatient appropriate because:IV treatments appropriate due to intensity of illness or inability to take PO   Dispo: The patient is from: Home              Anticipated d/c is to: Home              Patient currently is not medically stable to d/c.   Difficult to place patient No        Subjective: No nausea no vomiting.  No fever no chills.  Continues to have shortness of breath but improving.  No chest pain or chest tightness.  No dizziness no lightheadedness.  Physical Exam:  General: Appear in mild distress, no Rash; Oral Mucosa Clear, moist. no Abnormal Neck Mass Or lumps, Conjunctiva normal  Cardiovascular: S1 and S2 Present, no Murmur, Respiratory: good respiratory effort, Bilateral Air entry present and faint basal Crackles, no wheezes Abdomen: Bowel Sound present, Soft and no tenderness Extremities: no Pedal edema Neurology: alert and oriented to time, place, and person affect appropriate. no new focal deficit Gait not checked due to patient safety concerns    Vitals:   08/21/20 2256 08/22/20 0450 08/22/20 0500 08/22/20 1230  BP: 95/69 106/89  98/71  Pulse: 100 95  93  Resp: 18 18  20   Temp: 97.7 F (36.5 C) 97.8 F (36.6 C)  98 F (36.7 C)  TempSrc: Oral   Oral  SpO2: 98% 100%  100%  Weight:   61 kg   Height:        Intake/Output Summary (Last  24 hours) at 08/22/2020 1500 Last data filed at 08/22/2020 1040 Gross per 24 hour  Intake 294.07 ml  Output 1100 ml  Net -805.93 ml   Filed Weights   08/21/20 0650 08/22/20 0500  Weight: 56.7 kg 61 kg    Data Reviewed: I have personally reviewed and interpreted daily labs, tele strips, imaging. I reviewed all nursing notes, pharmacy notes, vitals, pertinent old records I have discussed plan of care as described above with RN and  patient/family.  CBC: Recent Labs  Lab 08/21/20 0725 08/22/20 0501  WBC 5.9 6.1  NEUTROABS 4.2  --   HGB 13.4 13.0  HCT 40.6 41.1  MCV 91.0 93.4  PLT 253 657   Basic Metabolic Panel: Recent Labs  Lab 08/21/20 0725 08/22/20 0501  NA 140 145  K 4.1 4.7  CL 107 109  CO2 19* 25  GLUCOSE 148* 89  BUN 22 22  CREATININE 1.19* 1.01*  CALCIUM 9.6 9.5    Studies: VAS [place] LOWER EXTREMITY VENOUS (DVT)  Result Date: 08/22/2020  Lower Venous DVT Study Patient Name:  HEATHERLY STENNER  Date of Exam:   08/22/2020 Medical Rec #: [id number]          Accession #:    [id number] Date of Birth: [birth year]           Patient Gender: F Patient Age:   86Y Exam Location:  [facility] Procedure:      VAS [place] LOWER EXTREMITY VENOUS (DVT) Referring Phys: [id number] [place] --------------------------------------------------------------------------------  Indications: Elevated Ddimer.  Risk Factors: None identified. Comparison Study: No prior studies. Performing Technologist: Oliver Hum RVT  Examination Guidelines: A complete evaluation includes B-mode imaging, spectral Doppler, color Doppler, and power Doppler as needed of all accessible portions of each vessel. Bilateral testing is considered an integral part of a complete examination. Limited examinations for reoccurring indications may be performed as noted. The reflux portion of the exam is performed with the patient in reverse Trendelenburg.  +---------+---------------+---------+-----------+----------+--------------+ RIGHT    CompressibilityPhasicitySpontaneityPropertiesThrombus Aging +---------+---------------+---------+-----------+----------+--------------+ CFV      Full           Yes      Yes                                 +---------+---------------+---------+-----------+----------+--------------+ SFJ      Full                                                         +---------+---------------+---------+-----------+----------+--------------+ FV Prox  Full                                                        +---------+---------------+---------+-----------+----------+--------------+ FV Mid   Full                                                        +---------+---------------+---------+-----------+----------+--------------+ FV DistalFull                                                        +---------+---------------+---------+-----------+----------+--------------+  PFV      Full                                                        +---------+---------------+---------+-----------+----------+--------------+ POP      Full           Yes      Yes                                 +---------+---------------+---------+-----------+----------+--------------+ PTV      Full                                                        +---------+---------------+---------+-----------+----------+--------------+ PERO     Full                                                        +---------+---------------+---------+-----------+----------+--------------+   +---------+---------------+---------+-----------+----------+--------------+ LEFT     CompressibilityPhasicitySpontaneityPropertiesThrombus Aging +---------+---------------+---------+-----------+----------+--------------+ CFV      Full           Yes      Yes                                 +---------+---------------+---------+-----------+----------+--------------+ SFJ      Full                                                        +---------+---------------+---------+-----------+----------+--------------+ FV Prox  Full                                                        +---------+---------------+---------+-----------+----------+--------------+ FV Mid   Full                                                         +---------+---------------+---------+-----------+----------+--------------+ FV DistalFull                                                        +---------+---------------+---------+-----------+----------+--------------+ PFV      Full                                                        +---------+---------------+---------+-----------+----------+--------------+  POP      Full           Yes      Yes                                 +---------+---------------+---------+-----------+----------+--------------+ PTV      Full                                                        +---------+---------------+---------+-----------+----------+--------------+ PERO     Full                                                        +---------+---------------+---------+-----------+----------+--------------+     Summary: RIGHT: - There is no evidence of deep vein thrombosis in the lower extremity.  - No cystic structure found in the popliteal fossa.  LEFT: - There is no evidence of deep vein thrombosis in the lower extremity.  - No cystic structure found in the popliteal fossa.  *See table(s) above for measurements and observations.    Preliminary     Scheduled Meds: . azithromycin  500 mg Oral Daily  . cefdinir  300 mg Oral Q12H  . furosemide  20 mg Intravenous BID  . sodium chloride flush  3 mL Intravenous Q12H   Continuous Infusions: . heparin 600 Units/hr (08/22/20 1000)   PRN Meds: acetaminophen **OR** acetaminophen, metoprolol tartrate, perflutren lipid microspheres (DEFINITY) IV suspension, polyethylene glycol  Time spent: 35 minutes  Author: Berle Mull, MD [facility] 08/22/2020 3:00 PM  To reach On-call, see care teams to locate the attending and reach out via [email or web address]. Between 7PM-7AM, please contact night-coverage If you still have difficulty reaching the attending provider, please page the [facility] (Director on Call) for [facility] on amion for  assistance.

## 2020-08-22 NOTE — Progress Notes (Signed)
Bilateral lower extremity venous duplex has been completed. Preliminary results can be found in CV Proc through chart review.   08/22/20 9:19 AM Carlos Levering RVT

## 2020-08-23 ENCOUNTER — Encounter (HOSPITAL_COMMUNITY)
Admission: EM | Disposition: A | Discharge status: Home Health | Payer: Self-pay | Source: Home / Self Care | Attending: Family Medicine

## 2020-08-23 ENCOUNTER — Encounter (HOSPITAL_COMMUNITY): Payer: Self-pay | Admitting: Internal Medicine

## 2020-08-23 ENCOUNTER — Other Ambulatory Visit (HOSPITAL_COMMUNITY): Payer: Self-pay

## 2020-08-23 ENCOUNTER — Inpatient Hospital Stay (HOSPITAL_COMMUNITY): Payer: Medicare Other

## 2020-08-23 DIAGNOSIS — I5021 Acute systolic (congestive) heart failure: Secondary | ICD-10-CM

## 2020-08-23 DIAGNOSIS — I350 Nonrheumatic aortic (valve) stenosis: Secondary | ICD-10-CM | POA: Diagnosis not present

## 2020-08-23 DIAGNOSIS — J189 Pneumonia, unspecified organism: Secondary | ICD-10-CM

## 2020-08-23 DIAGNOSIS — I2510 Atherosclerotic heart disease of native coronary artery without angina pectoris: Secondary | ICD-10-CM

## 2020-08-23 DIAGNOSIS — I5043 Acute on chronic combined systolic (congestive) and diastolic (congestive) heart failure: Secondary | ICD-10-CM

## 2020-08-23 HISTORY — PX: RIGHT/LEFT HEART CATH AND CORONARY ANGIOGRAPHY: CATH118266

## 2020-08-23 LAB — POCT I-STAT EG7
Acid-base deficit: 3 mmol/L — ABNORMAL HIGH (ref 0.0–2.0)
Acid-base deficit: 3 mmol/L — ABNORMAL HIGH (ref 0.0–2.0)
Bicarbonate: 20.4 mmol/L (ref 20.0–28.0)
Bicarbonate: 20.8 mmol/L (ref 20.0–28.0)
Calcium, Ion: 1.17 mmol/L (ref 1.15–1.40)
Calcium, Ion: 1.21 mmol/L (ref 1.15–1.40)
HCT: 34 % — ABNORMAL LOW (ref 36.0–46.0)
HCT: 34 % — ABNORMAL LOW (ref 36.0–46.0)
Hemoglobin: 11.6 g/dL — ABNORMAL LOW (ref 12.0–15.0)
Hemoglobin: 11.6 g/dL — ABNORMAL LOW (ref 12.0–15.0)
O2 Saturation: 59 %
O2 Saturation: 60 %
Potassium: 3.6 mmol/L (ref 3.5–5.1)
Potassium: 3.6 mmol/L (ref 3.5–5.1)
Sodium: 138 mmol/L (ref 135–145)
Sodium: 139 mmol/L (ref 135–145)
TCO2: 21 mmol/L — ABNORMAL LOW (ref 22–32)
TCO2: 22 mmol/L (ref 22–32)
pCO2, Ven: 31.8 mmHg — ABNORMAL LOW (ref 44.0–60.0)
pCO2, Ven: 32 mmHg — ABNORMAL LOW (ref 44.0–60.0)
pH, Ven: 7.414 (ref 7.250–7.430)
pH, Ven: 7.424 (ref 7.250–7.430)
pO2, Ven: 30 mmHg — CL (ref 32.0–45.0)
pO2, Ven: 30 mmHg — CL (ref 32.0–45.0)

## 2020-08-23 LAB — POCT I-STAT 7, (LYTES, BLD GAS, ICA,H+H)
Acid-base deficit: 5 mmol/L — ABNORMAL HIGH (ref 0.0–2.0)
Bicarbonate: 18.3 mmol/L — ABNORMAL LOW (ref 20.0–28.0)
Calcium, Ion: 1.11 mmol/L — ABNORMAL LOW (ref 1.15–1.40)
HCT: 32 % — ABNORMAL LOW (ref 36.0–46.0)
Hemoglobin: 10.9 g/dL — ABNORMAL LOW (ref 12.0–15.0)
O2 Saturation: 96 %
Potassium: 3.3 mmol/L — ABNORMAL LOW (ref 3.5–5.1)
Sodium: 140 mmol/L (ref 135–145)
TCO2: 19 mmol/L — ABNORMAL LOW (ref 22–32)
pCO2 arterial: 27.4 mmHg — ABNORMAL LOW (ref 32.0–48.0)
pH, Arterial: 7.432 (ref 7.350–7.450)
pO2, Arterial: 76 mmHg — ABNORMAL LOW (ref 83.0–108.0)

## 2020-08-23 LAB — BASIC METABOLIC PANEL
Anion gap: 11 (ref 5–15)
BUN: 17 mg/dL (ref 8–23)
CO2: 19 mmol/L — ABNORMAL LOW (ref 22–32)
Calcium: 8.9 mg/dL (ref 8.9–10.3)
Chloride: 105 mmol/L (ref 98–111)
Creatinine, Ser: 1.03 mg/dL — ABNORMAL HIGH (ref 0.44–1.00)
GFR, Estimated: 54 mL/min — ABNORMAL LOW (ref >60)
Glucose, Bld: 102 mg/dL — ABNORMAL HIGH (ref 70–99)
Potassium: 3.6 mmol/L (ref 3.5–5.1)
Sodium: 135 mmol/L (ref 135–145)

## 2020-08-23 LAB — HEPARIN LEVEL (UNFRACTIONATED): Heparin Unfractionated: 0.3 IU/mL (ref 0.30–0.70)

## 2020-08-23 LAB — CBC
HCT: 37 % (ref 36.0–46.0)
Hemoglobin: 12.2 g/dL (ref 12.0–15.0)
MCH: 29.4 pg (ref 26.0–34.0)
MCHC: 33 g/dL (ref 30.0–36.0)
MCV: 89.2 fL (ref 80.0–100.0)
Platelets: 236 10*3/uL (ref 150–400)
RBC: 4.15 MIL/uL (ref 3.87–5.11)
RDW: 12.1 % (ref 11.5–15.5)
WBC: 5.7 10*3/uL (ref 4.0–10.5)
nRBC: 0 % (ref 0.0–0.2)

## 2020-08-23 LAB — HEMOGLOBIN A1C
Hgb A1c MFr Bld: 5.4 % (ref 4.8–5.6)
Mean Plasma Glucose: 108 mg/dL

## 2020-08-23 SURGERY — RIGHT/LEFT HEART CATH AND CORONARY ANGIOGRAPHY
Anesthesia: LOCAL

## 2020-08-23 MED ORDER — LABETALOL HCL 5 MG/ML IV SOLN: 10.0000 mg | INTRAVENOUS | Status: AC | PRN

## 2020-08-23 MED ORDER — LIDOCAINE HCL (PF) 1 % IJ SOLN
INTRAMUSCULAR | Status: AC
  Filled 2020-08-23: qty 30

## 2020-08-23 MED ORDER — SODIUM CHLORIDE 0.9% FLUSH: 3.0000 mL | INTRAVENOUS | Status: DC | PRN

## 2020-08-23 MED ORDER — FUROSEMIDE 10 MG/ML IJ SOLN
INTRAMUSCULAR | Status: AC
  Filled 2020-08-23: qty 4

## 2020-08-23 MED ORDER — ONDANSETRON HCL 4 MG/2ML IJ SOLN: 4.0000 mg | Freq: Four times a day (QID) | INTRAMUSCULAR | Status: DC | PRN

## 2020-08-23 MED ORDER — MORPHINE SULFATE (PF) 2 MG/ML IV SOLN: 2.0000 mg | INTRAVENOUS | Status: DC | PRN

## 2020-08-23 MED ORDER — IOHEXOL 350 MG/ML SOLN
INTRAVENOUS | Status: DC | PRN
  Administered 2020-08-23: 40 mL

## 2020-08-23 MED ORDER — SODIUM CHLORIDE 0.9% FLUSH
3.0000 mL | Freq: Two times a day (BID) | INTRAVENOUS | Status: DC
  Administered 2020-08-24 – 2020-08-31 (×13): 3 mL via INTRAVENOUS

## 2020-08-23 MED ORDER — ACETAMINOPHEN 325 MG PO TABS: 650.0000 mg | ORAL_TABLET | ORAL | Status: DC | PRN

## 2020-08-23 MED ORDER — ACETAMINOPHEN 650 MG RE SUPP: 650.0000 mg | RECTAL | Status: DC | PRN

## 2020-08-23 MED ORDER — ASPIRIN 81 MG PO CHEW
81.0000 mg | CHEWABLE_TABLET | Freq: Every day | ORAL | Status: DC
  Administered 2020-08-24 – 2020-09-03 (×11): 81 mg via ORAL
  Filled 2020-08-23 (×11): qty 1

## 2020-08-23 MED ORDER — HEPARIN (PORCINE) IN NACL 1000-0.9 UT/500ML-% IV SOLN
INTRAVENOUS | Status: AC
  Filled 2020-08-23: qty 1000

## 2020-08-23 MED ORDER — FUROSEMIDE 10 MG/ML IJ SOLN
INTRAMUSCULAR | Status: DC | PRN
  Administered 2020-08-23: 40 mg via INTRAVENOUS

## 2020-08-23 MED ORDER — LIDOCAINE HCL (PF) 1 % IJ SOLN
INTRAMUSCULAR | Status: DC | PRN
  Administered 2020-08-23: 15 mL

## 2020-08-23 MED ORDER — SODIUM CHLORIDE 0.9 % IV SOLN: INTRAVENOUS | Status: AC

## 2020-08-23 MED ORDER — HYDRALAZINE HCL 20 MG/ML IJ SOLN: 10.0000 mg | INTRAMUSCULAR | Status: AC | PRN

## 2020-08-23 MED ORDER — SODIUM CHLORIDE 0.9 % IV SOLN
250.0000 mL | INTRAVENOUS | Status: DC | PRN
  Administered 2020-08-31: 250 mL via INTRAVENOUS

## 2020-08-23 MED ORDER — HEPARIN (PORCINE) IN NACL 1000-0.9 UT/500ML-% IV SOLN
INTRAVENOUS | Status: DC | PRN
  Administered 2020-08-23 (×2): 500 mL

## 2020-08-23 SURGICAL SUPPLY — 13 items
CATH INFINITI 5FR MULTPACK ANG (CATHETERS) ×1 IMPLANT
CATH SWAN GANZ 7F STRAIGHT (CATHETERS) ×1 IMPLANT
CLOSURE MYNX CONTROL 5F (Vascular Products) ×1 IMPLANT
CLOSURE MYNX CONTROL 6F/7F (Vascular Products) ×1 IMPLANT
KIT HEART LEFT (KITS) ×2 IMPLANT
PACK CARDIAC CATHETERIZATION (CUSTOM PROCEDURE TRAY) ×2 IMPLANT
SHEATH PINNACLE 5F 10CM (SHEATH) ×1 IMPLANT
SHEATH PINNACLE 7F 10CM (SHEATH) ×1 IMPLANT
SHEATH PROBE COVER 6X72 (BAG) ×1 IMPLANT
TRANSDUCER W/STOPCOCK (MISCELLANEOUS) ×2 IMPLANT
TUBING CIL FLEX 10 FLL-RA (TUBING) ×2 IMPLANT
WIRE EMERALD 3MM-J .035X150CM (WIRE) ×1 IMPLANT
WIRE EMERALD ST .035X260CM (WIRE) ×1 IMPLANT

## 2020-08-23 NOTE — Consult Note (Addendum)
[facility] [place]  Cardiology Consultation:   Patient ID: Alexandria Randolph MRN: [id number]; DOB: Feb 01, 1935  Admit date: 08/21/2020 Date of Consult: 08/23/2020  Primary Care Provider: Marrian Salvage, [facility] Cardiologist: Donato Heinz, MD  [place] Electrophysiologist:  None    Patient Profile:   Alexandria Randolph is a 86 y.o. female with a hx of HTN, HLD, breast cancer s/p lumpectomy and radioactive seed Rx (2018) and remote, light tobacco abuse who is being seen today for the evaluation of severe AS and new HFrEF at the request of Dr. Johnsie Cancel.  History of Present Illness:   Alexandria Randolph lives alone in a house in [place].  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  She is not married and has no kids.  She has a very close, longtime friend named [name] who is present in the room today.  He has lived with her previously, but currently lives down the street.  He plans to move back in with her given her recent decline in health.  The patient takes care of all of her own ADLs, including cooking and cleaning.  She occasionally drives, but [name] mostly chauffeurs her around.  She can walk without the aid of a cane or walker.  She is not very active.  The most activity she gets is walking around her house or out to her car or mailbox. She is missing some teeth, but has had regular dental work with no active issues.  She has been fully vaccinated and boosted for Covid 19.   She has no past cardiac history but does follow with a PCP. The only medication she takes is letrozole. She does not ever remember being told she had a heart murmur.  She was in her usual state of health until about 1-2 weeks ago when she developed sudden onset of shortness of breath, which has become progressively worse. Her friend, [name] finally talked her into  going to the emergency room for evaluation and she presented to [facility] on 6/4.   In the ED, patient tachypneic and hypotensive at times. O2 sats >90% on room air. EKG showed sinus tachycardia (HR 112), septal Q waves, and ST depressions with T wave inversions in lateral and anterolateral precordial leads. These were new compared to prior ECGs. High-sensitivity troponin elevated and flat at 372 >> 389. BNP markedly elevated at 2,105. Chest x-ray showed mild cardiomegaly with no overt edema. D-dimer elevated at 1.17. Chest CTA negative for PE but did show some consolidation in the right lower lobe and to a lesser extent posteriorly in the right upper lobe concerning for multilobar pneumonia. This also showed aortic valve calcification and scattered coronary calcifications suggestive of chronic coronary and valvular heart disease. LE dopplers negative for DVT. Procalcitonin was normal. WBC 5.9, Hgb 13.4, Plts 253. Na 140, K 4.1, Glucose 148, BUN 22, Cr 1.19. Respiratory panel negative for COVID-19 and influenza A/B. Patient was started on IV Lasix as well as antibiotics. Echo revealed severe LV systolic dysfunction with an of EF 20-25% and severe AS (Vmax 4.3, MG 49, AVA 0.7, DI 0.26, SVI 49).    She has been diuresing with improvement in symptoms. Today she is seen laying in bed. [name] is present at the bedside.  She reports having 2 weeks of dyspnea on exertion.  Even mild movement like walking to the bathroom cause  shortness of breath.  She denies dyspnea at rest.  She is also been having orthopnea to the point that she has been sleeping on the sofa.  She denies PND, lower extremity edema or abdominal distention.  She has had early satiety and decreased appetite.  She denies syncope.  She has had some occasional episodes of dizziness.  She has had a chronic cough which she attributed to allergies.  She denies any significant fatigue.   Past Medical History:  Diagnosis Date  . Allergy    SEASONAL  .  Cataracts, bilateral   . DYSLIPIDEMIA 12/01/2006  . Environmental allergies    has had allergy test:grass,trees,plants,dust  . HYPERGLYCEMIA 12/01/2006  . HYPERTENSION 12/01/2006  . OSTEOPOROSIS 12/01/2006  . Severe aortic stenosis   . SMOKER 12/01/2006    Past Surgical History:  Procedure Laterality Date  . ABDOMINAL HYSTERECTOMY    . BREAST LUMPECTOMY WITH RADIOACTIVE SEED LOCALIZATION Right 01/19/2017   Procedure: RIGHT BREAST LUMPECTOMY WITH RADIOACTIVE SEED LOCALIZATION;  Surgeon: Fanny Skates, MD;  Location: [place];  Service: General;  Laterality: Right;  . COLONOSCOPY    . POLYPECTOMY    . TOTAL ABDOMINAL HYSTERECTOMY W/ BILATERAL SALPINGOOPHORECTOMY  1980     Home Medications:  Prior to Admission medications   Medication Sig Start Date End Date Taking? Authorizing Provider  fexofenadine (ALLEGRA) 180 MG tablet Take 180 mg by mouth daily.   Yes [provider]  letrozole (FEMARA) 2.5 MG tablet Take 1 tablet (2.5 mg total) by mouth daily. 06/02/20  Yes Nicholas Lose, MD  Polyethyl Glycol-Propyl Glycol (SYSTANE FREE OP) Place 1 drop into both eyes daily as needed (For dry eyes).   Yes [provider]    Inpatient Medications: Scheduled Meds: . sodium chloride flush  3 mL Intravenous Q12H  . sodium chloride flush  3 mL Intravenous Q12H   Continuous Infusions: . sodium chloride    . sodium chloride 10 mL/hr at 08/23/20 0557  . heparin 650 Units/hr (08/23/20 1214)   PRN Meds: sodium chloride, acetaminophen **OR** acetaminophen, metoprolol tartrate, polyethylene glycol, sodium chloride flush  Allergies:    Allergies  Allergen Reactions  . Losartan Potassium     REACTION: sob  . Olmesartan Medoxomil     REACTION: sob  . Simvastatin     REACTION: Malgias    Social History:   Social History   Socioeconomic History  . Marital status: Single    Spouse name: Not on file  . Number of children: 0  . Years of education: Not on file   . Highest education level: Not on file  Occupational History  . Occupation: Retired  Tobacco Use  . Smoking status: Former Smoker    Types: Cigarettes    Quit date: 03/20/2000    Years since quitting: 20.4  . Smokeless tobacco: Never Used  . Tobacco comment: Pt states that she smoked off and on- "I don't know for how long"  Substance and Sexual Activity  . Alcohol use: Not Currently    Alcohol/week: 0.0 standard drinks    Comment: occ  . Drug use: No  . Sexual activity: Not on file  Other Topics Concern  . Not on file  Social History Narrative   Married   Social Determinants of Health   Financial Resource Strain: Not on file  Food Insecurity: Not on file  Transportation Needs: Not on file  Physical Activity: Not on file  Stress: Not on file  Social Connections: Not on file  Intimate Partner Violence: Not on file    Family History:    Family History  Problem Relation Age of Onset  . Cancer Mother        "Throat" and colon cancer, both at an advanced age  . Diabetes Father   . Breast cancer Paternal Aunt   . Colon cancer Neg Hx      ROS:  Please see the history of present illness.  All other ROS reviewed and negative.     Physical Exam/Data:   Vitals:   08/22/20 2133 08/22/20 2334 08/23/20 0142 08/23/20 0316  BP: 107/72 124/76  109/85  Pulse: (!) 101 (!) 109  (!) 101  Resp: 20 20  19   Temp: 97.9 F (36.6 C) 98 F (36.7 C)  98 F (36.7 C)  TempSrc: Oral Oral  Oral  SpO2: 100% 98%  98%  Weight:   59.6 kg   Height:   4\' 11"  (1.499 m)     Intake/Output Summary (Last 24 hours) at 08/23/2020 1520 Last data filed at 08/23/2020 0900 Gross per 24 hour  Intake 624.76 ml  Output 1150 ml  Net -525.24 ml   Last 3 Weights 08/23/2020 08/22/2020 08/21/2020  Weight (lbs) 131 lb 8 oz 134 lb 7.7 oz 125 lb  Weight (kg) 59.648 kg 61 kg 56.7 kg     Body mass index is 26.56 kg/m.  General: Elderly, thin African-American female. HEENT: normal Lymph: no adenopathy Neck: no  JVD Endocrine:  No thryomegaly Cardiac:  normal S1, S2; RRR; very soft systolic murmur. Lungs:  clear to auscultation bilaterally, no wheezing, rhonchi or rales  Abd: soft, nontender, no hepatomegaly  Ext: no edema Musculoskeletal:  No deformities, BUE and BLE strength normal and equal Skin: warm and dry  Neuro:  CNs 2-12 intact, no focal abnormalities noted Psych:  Normal affect   EKG:  The EKG was personally reviewed and demonstrates: sinus tachy HR 107 with TWI in lateral leads (I, V4-V6).  Telemetry:  Telemetry was personally reviewed and demonstrates: Sinus  Relevant CV Studies: Echo 08/22/20 IMPRESSIONS  1. Left ventricular ejection fraction, by estimation, is 20 to 25%. The  left ventricle has severely decreased function. The left ventricle  demonstrates global hypokinesis. There is mild left ventricular  hypertrophy. Left ventricular diastolic parameters  are consistent with Grade II diastolic dysfunction (pseudonormalization).  Elevated left atrial pressure.  2. Right ventricular systolic function is mildly reduced. The right  ventricular size is normal. Tricuspid regurgitation signal is inadequate  for assessing PA pressure.  3. The mitral valve is normal in structure. Trivial mitral valve  regurgitation.  4. The aortic valve is calcified. There is severe calcifcation of the  aortic valve. Aortic valve regurgitation is trivial. Severe aortic valve  stenosis. Vmax 4.3 m/s, MG 49 mmHg, AVA 0.7 cm^2, DI 0.26  5. The inferior vena cava is normal in size with <50% respiratory  variability, suggesting right atrial pressure of 8 mmHg.   FINDINGS  Left Ventricle: Left ventricular ejection fraction, by estimation, is 20  to 25%. The left ventricle has severely decreased function. The left  ventricle demonstrates global hypokinesis. The left ventricular internal  cavity size was normal in size. There  is mild left ventricular hypertrophy. Left ventricular diastolic   parameters are consistent with Grade II diastolic dysfunction  (pseudonormalization). Elevated left atrial pressure.   Right Ventricle: The right ventricular size is normal. Right vetricular  wall thickness was not well visualized. Right ventricular systolic  function is mildly  reduced. Tricuspid regurgitation signal is inadequate  for assessing PA pressure.   Left Atrium: Left atrial size was normal in size.   Right Atrium: Right atrial size was normal in size.   Pericardium: There is no evidence of pericardial effusion.   Mitral Valve: The mitral valve is normal in structure. Trivial mitral  valve regurgitation.   Tricuspid Valve: The tricuspid valve is normal in structure. Tricuspid  valve regurgitation is trivial.   Aortic Valve: The aortic valve is calcified. There is severe calcifcation  of the aortic valve. Aortic valve regurgitation is trivial. Severe aortic  stenosis is present. Aortic valve mean gradient measures 40.2 mmHg. Aortic  valve peak gradient measures  66.2 mmHg. Aortic valve area, by VTI measures 0.82 cm.   Pulmonic Valve: The pulmonic valve was not well visualized. Pulmonic valve  regurgitation is not visualized.   Aorta: The aortic root and ascending aorta are structurally normal, with  no evidence of dilitation.   Venous: The inferior vena cava is normal in size with less than 50%  respiratory variability, suggesting right atrial pressure of 8 mmHg.   IAS/Shunts: The interatrial septum was not well visualized.     LEFT VENTRICLE  PLAX 2D  LVIDd:     4.40 cm   Diastology  LVIDs:     3.80 cm   LV e' medial:  3.26 cm/s  LV PW:     1.30 cm   LV E/e' medial: 31.0  LV IVS:    1.20 cm   LV e' lateral:  4.68 cm/s  LVOT diam:   1.90 cm   LV E/e' lateral: 21.6  LV SV:     77  LV SV Index:  49  LVOT Area:   2.84 cm    LV Volumes (MOD)  LV vol d, MOD A2C: 79.7 ml  LV vol d, MOD A4C: 44.9 ml  LV vol s, MOD A2C:  78.1 ml  LV vol s, MOD A4C: 55.6 ml  LV SV MOD A2C:   1.6 ml  LV SV MOD A4C:   44.9 ml  LV SV MOD BP:   15.1 ml   RIGHT VENTRICLE      IVC  RV S prime:   6.31 cm/s IVC diam: 1.80 cm  TAPSE (M-mode): 1.3 cm   LEFT ATRIUM       Index    RIGHT ATRIUM     Index  LA diam:    3.40 cm 2.18 cm/m RA Area:   8.95 cm  LA Vol (A2C):  29.0 ml 18.61 ml/m RA Volume:  17.40 ml 11.17 ml/m  LA Vol (A4C):  40.9 ml 26.25 ml/m  LA Biplane Vol: 37.4 ml 24.00 ml/m  AORTIC VALVE          PULMONIC VALVE  AV Area (Vmax):  0.91 cm   PR End Diast Vel: 2.66 msec  AV Area (Vmean):  0.84 cm  AV Area (VTI):   0.82 cm  AV Vmax:      406.80 cm/s  AV Vmean:     298.600 cm/s  AV VTI:      0.937 m  AV Peak Grad:   66.2 mmHg  AV Mean Grad:   40.2 mmHg  LVOT Vmax:     131.00 cm/s  LVOT Vmean:    88.700 cm/s  LVOT VTI:     0.271 m  LVOT/AV VTI ratio: 0.29    AORTA  Ao Root diam: 2.90 cm  Ao Asc diam: 3.30 cm  MITRAL VALVE  MV Area (PHT): 9.85 cm   SHUNTS  MV Decel Time: 77 msec   Systemic VTI: 0.27 m  MV E velocity: 101.00 cm/s Systemic Diam: 1.90 cm  MV A velocity: 61.30 cm/s  MV E/A ratio: 1.65   _____________________  Carotid dopplers 08/23/20 Summary:  Right Carotid: Velocities in the right ICA are consistent with a 1-39%  stenosis.   Left Carotid: Velocities in the left ICA are consistent with a 1-39%  stenosis.   Vertebrals: Bilateral vertebral arteries demonstrate antegrade flow.  Subclavians: Normal flow hemodynamics were seen in bilateral subclavian        arteries.   Laboratory Data:  High Sensitivity Troponin:   Recent Labs  Lab 08/21/20 1558 08/21/20 1834  TROPONINIHS 372* 389*     Chemistry Recent Labs  Lab 08/21/20 0725 08/22/20 0501 08/23/20 0706  NA 140 145 135  K 4.1 4.7 3.6  CL 107 109 105  CO2 19* 25 19*  GLUCOSE 148* 89 102*  BUN 22 22 17    CREATININE 1.19* 1.01* 1.03*  CALCIUM 9.6 9.5 8.9  GFRNONAA 45* 55* 54*  ANIONGAP 14 11 11     Recent Labs  Lab 08/21/20 0725  PROT 7.4  ALBUMIN 4.0  AST 37  ALT 50*  ALKPHOS 95  BILITOT 1.2   Hematology Recent Labs  Lab 08/21/20 0725 08/22/20 0501 08/23/20 0706  WBC 5.9 6.1 5.7  RBC 4.46 4.40 4.15  HGB 13.4 13.0 12.2  HCT 40.6 41.1 37.0  MCV 91.0 93.4 89.2  MCH 30.0 29.5 29.4  MCHC 33.0 31.6 33.0  RDW 12.2 12.3 12.1  PLT 253 250 236   BNP Recent Labs  Lab 08/21/20 0708  BNP 2,105.0*    DDimer  Recent Labs  Lab 08/21/20 0728  DDIMER 1.17*     Radiology/Studies:  CT Angio Chest PE W/Cm &/Or Wo Cm  Result Date: 08/21/2020 CLINICAL DATA:  Shortness of breath and elevated D-dimer levels. EXAM: CT ANGIOGRAPHY CHEST WITH CONTRAST TECHNIQUE: Multidetector CT imaging of the chest was performed using the standard protocol during bolus administration of intravenous contrast. Multiplanar CT image reconstructions and MIPs were obtained to evaluate the vascular anatomy. CONTRAST:  37mL OMNIPAQUE IOHEXOL 350 MG/ML SOLN COMPARISON:  Chest radiograph 08/21/2020 FINDINGS: Despite efforts by the technologist and patient, motion artifact is present on today's exam and could not be eliminated. This reduces exam sensitivity and specificity. Cardiovascular: No filling defect is identified in the pulmonary arterial tree to suggest pulmonary embolus. Coronary, aortic arch, and branch vessel atherosclerotic vascular disease. The aorta and systemic vasculature are not opacified on today's exam which was optimized for pulmonary arterial opacification. Mild cardiomegaly.  Aortic valve calcification. Mediastinum/Nodes: Unremarkable Lungs/Pleura: Small bilateral pleural effusions. Airway thickening is present, suggesting bronchitis or reactive airways disease. Consolidation in the right lower lobe and to lesser extent posteriorly in the right upper lobe noted on images 42 through 83 of series 5.  Upper Abdomen: Unremarkable Musculoskeletal: Severe left degenerative glenohumeral arthropathy. Thoracic spondylosis and thoracic kyphosis. Age indeterminate mild superior endplate compression fracture at L1. Review of the MIP images confirms the above findings. IMPRESSION: 1. No filling defect is identified in the pulmonary arterial tree to suggest pulmonary embolus. 2. Consolidation in the right lower lobe and to a lesser extent posteriorly in the right upper lobe concerning for multilobar pneumonia. Followup PA and lateral chest X-ray is recommended in 3-4 weeks following trial of antibiotic therapy to ensure resolution and exclude underlying malignancy. 3. Other  imaging findings of potential clinical significance: Aortic Atherosclerosis (ICD10-I70.0). Coronary atherosclerosis. Aortic valve calcification. Mild cardiomegaly. Small bilateral pleural effusions. Airway thickening is present, suggesting bronchitis or reactive airways disease. Severe left degenerative glenohumeral arthropathy. Age indeterminate mild superior endplate compression fracture at L1. Electronically Signed   By: Van Clines M.D.   On: 08/21/2020 12:55   DG Chest Port 1 View  Result Date: 08/21/2020 CLINICAL DATA:  Shortness of breath EXAM: PORTABLE CHEST 1 VIEW COMPARISON:  November 01, 2014 FINDINGS: There is no edema or airspace opacity. Heart is mildly enlarged with pulmonary vascularity normal. No adenopathy. There is degenerative change in the thoracic spine. Clips noted in right breast region. IMPRESSION: No edema or airspace opacity.  Heart mildly enlarged. Electronically Signed   By: Lowella Grip III M.D.   On: 08/21/2020 08:08   ECHOCARDIOGRAM COMPLETE  Result Date: 08/22/2020    ECHOCARDIOGRAM REPORT   Patient Name:   Alexandria Randolph Date of Exam: 08/22/2020 Medical Rec #:  [id number]         Height:       59.0 in Accession #:    [id number]        Weight:       134.5 lb Date of Birth:  1935-01-08          BSA:           1.558 m Patient Age:    3 years          BP:           98/71 mmHg Patient Gender: F                 HR:           93 bpm. Exam Location:  Inpatient Procedure: 2D Echo Indications:    Heart failure  History:        Patient has no prior history of Echocardiogram examinations.                 CHF.  Sonographer:    Roseanna Rainbow Referring Phys: [id number] [place]  1. Left ventricular ejection fraction, by estimation, is 20 to 25%. The left ventricle has severely decreased function. The left ventricle demonstrates global hypokinesis. There is mild left ventricular hypertrophy. Left ventricular diastolic parameters  are consistent with Grade II diastolic dysfunction (pseudonormalization). Elevated left atrial pressure.  2. Right ventricular systolic function is mildly reduced. The right ventricular size is normal. Tricuspid regurgitation signal is inadequate for assessing PA pressure.  3. The mitral valve is normal in structure. Trivial mitral valve regurgitation.  4. The aortic valve is calcified. There is severe calcifcation of the aortic valve. Aortic valve regurgitation is trivial. Severe aortic valve stenosis. Vmax 4.3 m/s, MG 49 mmHg, AVA 0.7 cm^2, DI 0.26  5. The inferior vena cava is normal in size with <50% respiratory variability, suggesting right atrial pressure of 8 mmHg. FINDINGS  Left Ventricle: Left ventricular ejection fraction, by estimation, is 20 to 25%. The left ventricle has severely decreased function. The left ventricle demonstrates global hypokinesis. The left ventricular internal cavity size was normal in size. There is mild left ventricular hypertrophy. Left ventricular diastolic parameters are consistent with Grade II diastolic dysfunction (pseudonormalization). Elevated left atrial pressure. Right Ventricle: The right ventricular size is normal. Right vetricular wall thickness was not well visualized. Right ventricular systolic function is mildly reduced. Tricuspid regurgitation  signal is inadequate for assessing PA pressure. Left Atrium: Left atrial size was  normal in size. Right Atrium: Right atrial size was normal in size. Pericardium: There is no evidence of pericardial effusion. Mitral Valve: The mitral valve is normal in structure. Trivial mitral valve regurgitation. Tricuspid Valve: The tricuspid valve is normal in structure. Tricuspid valve regurgitation is trivial. Aortic Valve: The aortic valve is calcified. There is severe calcifcation of the aortic valve. Aortic valve regurgitation is trivial. Severe aortic stenosis is present. Aortic valve mean gradient measures 40.2 mmHg. Aortic valve peak gradient measures 66.2 mmHg. Aortic valve area, by VTI measures 0.82 cm. Pulmonic Valve: The pulmonic valve was not well visualized. Pulmonic valve regurgitation is not visualized. Aorta: The aortic root and ascending aorta are structurally normal, with no evidence of dilitation. Venous: The inferior vena cava is normal in size with less than 50% respiratory variability, suggesting right atrial pressure of 8 mmHg. IAS/Shunts: The interatrial septum was not well visualized.  LEFT VENTRICLE PLAX 2D LVIDd:         4.40 cm     Diastology LVIDs:         3.80 cm     LV e' medial:    3.26 cm/s LV PW:         1.30 cm     LV E/e' medial:  31.0 LV IVS:        1.20 cm     LV e' lateral:   4.68 cm/s LVOT diam:     1.90 cm     LV E/e' lateral: 21.6 LV SV:         77 LV SV Index:   49 LVOT Area:     2.84 cm  LV Volumes (MOD) LV vol d, MOD A2C: 79.7 ml LV vol d, MOD A4C: 44.9 ml LV vol s, MOD A2C: 78.1 ml LV vol s, MOD A4C: 55.6 ml LV SV MOD A2C:     1.6 ml LV SV MOD A4C:     44.9 ml LV SV MOD BP:      15.1 ml RIGHT VENTRICLE            IVC RV S prime:     6.31 cm/s  IVC diam: 1.80 cm TAPSE (M-mode): 1.3 cm LEFT ATRIUM             Index       RIGHT ATRIUM          Index LA diam:        3.40 cm 2.18 cm/m  RA Area:     8.95 cm LA Vol (A2C):   29.0 ml 18.61 ml/m RA Volume:   17.40 ml 11.17 ml/m LA Vol  (A4C):   40.9 ml 26.25 ml/m LA Biplane Vol: 37.4 ml 24.00 ml/m  AORTIC VALVE                    PULMONIC VALVE AV Area (Vmax):    0.91 cm     PR End Diast Vel: 2.66 msec AV Area (Vmean):   0.84 cm AV Area (VTI):     0.82 cm AV Vmax:           406.80 cm/s AV Vmean:          298.600 cm/s AV VTI:            0.937 m AV Peak Grad:      66.2 mmHg AV Mean Grad:      40.2 mmHg LVOT Vmax:         131.00 cm/s LVOT Vmean:  88.700 cm/s LVOT VTI:          0.271 m LVOT/AV VTI ratio: 0.29  AORTA Ao Root diam: 2.90 cm Ao Asc diam:  3.30 cm MITRAL VALVE MV Area (PHT): 9.85 cm     SHUNTS MV Decel Time: 77 msec      Systemic VTI:  0.27 m MV E velocity: 101.00 cm/s  Systemic Diam: 1.90 cm MV A velocity: 61.30 cm/s MV E/A ratio:  1.65 Oswaldo Milian MD Electronically signed by Oswaldo Milian MD Signature Date/Time: 08/22/2020/5:07:56 PM    Final    VAS US CAROTID  Result Date: 08/23/2020 Carotid Arterial Duplex Study Patient Name:  Alexandria Randolph  Date of Exam:   08/23/2020 Medical Rec #: [id number]          Accession #:    [id number] Date of Birth: 04-Feb-1935           Patient Gender: F Patient Age:   89J Exam Location:  [facility] Procedure:      VAS US CAROTID Referring Phys: [id number] [place] --------------------------------------------------------------------------------  Indications:       Severe aortic stenosis. Risk Factors:      Hypertension, hyperlipidemia, past history of smoking. Comparison Study:  No prior studies. Performing Technologist: Darlin Coco RDMS,RVT  Examination Guidelines: A complete evaluation includes B-mode imaging, spectral Doppler, color Doppler, and power Doppler as needed of all accessible portions of each vessel. Bilateral testing is considered an integral part of a complete examination. Limited examinations for reoccurring indications may be performed as noted.  Right Carotid Findings:  +----------+--------+--------+--------+------------------+------------------+           PSV cm/sEDV cm/sStenosisPlaque DescriptionComments           +----------+--------+--------+--------+------------------+------------------+ CCA Prox  44      14                                                   +----------+--------+--------+--------+------------------+------------------+ CCA Distal48      20                                intimal thickening +----------+--------+--------+--------+------------------+------------------+ ICA Prox  33      15      1-39%   calcific                             +----------+--------+--------+--------+------------------+------------------+ ICA Distal58      24                                                   +----------+--------+--------+--------+------------------+------------------+ ECA       63      20                                                   +----------+--------+--------+--------+------------------+------------------+ +----------+--------+-------+----------------+-------------------+           PSV cm/sEDV cmsDescribe        Arm Pressure (mmHG) +----------+--------+-------+----------------+-------------------+ [id number]  Multiphasic, WNL                    +----------+--------+-------+----------------+-------------------+ +---------+--------+--+--------+--+---------+ VertebralPSV cm/s24EDV cm/s10Antegrade +---------+--------+--+--------+--+---------+  Left Carotid Findings: +----------+-------+--------+--------+-----------------------+-----------------+           PSV    EDV cm/sStenosisPlaque Description     Comments                    cm/s                                                            +----------+-------+--------+--------+-----------------------+-----------------+ CCA Prox  51     14                                                        +----------+-------+--------+--------+-----------------------+-----------------+ CCA Distal41     15                                     intimal                                                                   thickening        +----------+-------+--------+--------+-----------------------+-----------------+ ICA Prox  36     15      1-39%   calcific and                                                              heterogenous                             +----------+-------+--------+--------+-----------------------+-----------------+ ICA Distal73     26                                                       +----------+-------+--------+--------+-----------------------+-----------------+ ECA       47                                                              +----------+-------+--------+--------+-----------------------+-----------------+ +----------+--------+--------+----------------+-------------------+           PSV cm/sEDV cm/sDescribe        Arm Pressure (mmHG) +----------+--------+--------+----------------+-------------------+ [id number]              Multiphasic,  WNL                    +----------+--------+--------+----------------+-------------------+ +---------+--------+--+--------+--+---------+ VertebralPSV cm/s42EDV cm/s11Antegrade +---------+--------+--+--------+--+---------+   Summary: Right Carotid: Velocities in the right ICA are consistent with a 1-39% stenosis. Left Carotid: Velocities in the left ICA are consistent with a 1-39% stenosis. Vertebrals:  Bilateral vertebral arteries demonstrate antegrade flow. Subclavians: Normal flow hemodynamics were seen in bilateral subclavian              arteries. *See table(s) above for measurements and observations.  Electronically signed by Jamelle Haring on 08/23/2020 at 2:54:55 PM.    Final    VAS [place] LOWER EXTREMITY VENOUS (DVT)  Result Date: 08/22/2020  Lower Venous DVT Study Patient Name:   Alexandria Randolph  Date of Exam:   08/22/2020 Medical Rec #: [id number]          Accession #:    [id number] Date of Birth: [birth year]           Patient Gender: F Patient Age:   86Y Exam Location:  [facility] Procedure:      VAS [place] LOWER EXTREMITY VENOUS (DVT) Referring Phys: [id number] [place] --------------------------------------------------------------------------------  Indications: Elevated Ddimer.  Risk Factors: None identified. Comparison Study: No prior studies. Performing Technologist: Oliver Hum RVT  Examination Guidelines: A complete evaluation includes B-mode imaging, spectral Doppler, color Doppler, and power Doppler as needed of all accessible portions of each vessel. Bilateral testing is considered an integral part of a complete examination. Limited examinations for reoccurring indications may be performed as noted. The reflux portion of the exam is performed with the patient in reverse Trendelenburg.  +---------+---------------+---------+-----------+----------+--------------+ RIGHT    CompressibilityPhasicitySpontaneityPropertiesThrombus Aging +---------+---------------+---------+-----------+----------+--------------+ CFV      Full           Yes      Yes                                 +---------+---------------+---------+-----------+----------+--------------+ SFJ      Full                                                        +---------+---------------+---------+-----------+----------+--------------+ FV Prox  Full                                                        +---------+---------------+---------+-----------+----------+--------------+ FV Mid   Full                                                        +---------+---------------+---------+-----------+----------+--------------+ FV DistalFull                                                        +---------+---------------+---------+-----------+----------+--------------+ PFV      Full                                                         +---------+---------------+---------+-----------+----------+--------------+  POP      Full           Yes      Yes                                 +---------+---------------+---------+-----------+----------+--------------+ PTV      Full                                                        +---------+---------------+---------+-----------+----------+--------------+ PERO     Full                                                        +---------+---------------+---------+-----------+----------+--------------+   +---------+---------------+---------+-----------+----------+--------------+ LEFT     CompressibilityPhasicitySpontaneityPropertiesThrombus Aging +---------+---------------+---------+-----------+----------+--------------+ CFV      Full           Yes      Yes                                 +---------+---------------+---------+-----------+----------+--------------+ SFJ      Full                                                        +---------+---------------+---------+-----------+----------+--------------+ FV Prox  Full                                                        +---------+---------------+---------+-----------+----------+--------------+ FV Mid   Full                                                        +---------+---------------+---------+-----------+----------+--------------+ FV DistalFull                                                        +---------+---------------+---------+-----------+----------+--------------+ PFV      Full                                                        +---------+---------------+---------+-----------+----------+--------------+ POP      Full           Yes      Yes                                 +---------+---------------+---------+-----------+----------+--------------+  PTV      Full                                                         +---------+---------------+---------+-----------+----------+--------------+ PERO     Full                                                        +---------+---------------+---------+-----------+----------+--------------+     Summary: RIGHT: - There is no evidence of deep vein thrombosis in the lower extremity.  - No cystic structure found in the popliteal fossa.  LEFT: - There is no evidence of deep vein thrombosis in the lower extremity.  - No cystic structure found in the popliteal fossa.  *See table(s) above for measurements and observations. Electronically signed by Ruta Hinds MD on 08/22/2020 at 5:10:02 PM.    Final      STS Risk Calculator: Procedure: AV Replacement  Procedure: Isolated AVR Risk of Mortality: 4.323% Renal Failure: 2.954% Permanent Stroke: 2.329% Prolonged Ventilation: 18.244% DSW Infection: 0.090% Reoperation: 4.283% Morbidity or Mortality: 22.377% Short Length of Stay: 14.331% Long Length of Stay: 13.430%  ____________________  [facility] Cardiomyopathy Questionnaire  KCCQ-12 08/23/2020  1 a. Ability to shower/bathe Quite a bit limited  1 b. Ability to walk 1 block Extremely limited  1 c. Ability to hurry/jog Other, Did not do  2. Edema feet/ankles/legs Never over the past 2 weeks  3. Limited by fatigue Less than once a week  4. Limited by dyspnea All of the time  5. Sitting up / on 3+ pillows Every night  6. Limited enjoyment of life Extremely limited  7. Rest of life w/ symptoms Not at all satisfied  8 a. Participation in hobbies Severely limited  8 b. Participation in chores Severely limited  8 c. Visiting family/friends Severely limited      Assessment and Plan:   Alexandria Randolph is a 86 y.o. female with symptoms of severe, stage D3 aortic stenosis with NYHA Class III symptoms. I have reviewed the patient's recent echocardiogram which is notable for severely reduced LV systolic function (EF 06-23%) and severe aortic  stenosis with peak gradient of 36mmhg and mean transvalvular gradient of 46 mm hg. The patient's dimensionless index is 0.27 and calculated aortic valve area is 0.84 cm. L/RHC is planned for this afternoon. If renal function remains stable, we can do CTs tomorrow.    I have reviewed the natural history of aortic stenosis with the patient. We have discussed the limitations of medical therapy and the poor prognosis associated with symptomatic aortic stenosis. We have reviewed potential treatment options, including palliative medical therapy, conventional surgical aortic valve replacement, and transcatheter aortic valve replacement. We discussed treatment options in the context of this patient's specific comorbid medical conditions.    The patient's predicted risk of mortality with conventional aortic valve replacement is 4.323% primarily based on age, acute CHF, HTN and breast cancer. TAVR seems like a reasonable treatment option for this patient pending formal cardiac surgical consultation. We discussed typical evaluation which will require a L/RHC, gated cardiac CTA and a CTA of the chest/abdomen/pelvis to evaluate both his cardiac anatomy and  peripheral vasculature. Follow-up testing will be arranged as an inpatient and outpatient.    Dr. Burt Knack to follow.   For questions or updates, please contact [place] Please consult [email or web address] for contact info under    Signed, Angelena Form, PA-C  08/23/2020 3:20 PM  Patient seen, examined. Available data reviewed. Agree with findings, assessment, and plan as outlined by Nell Range, PA-C.   On my exam: Vitals:   08/24/20 0031 08/24/20 0406  BP: 101/73 99/67  Pulse: 97 94  Resp: (!) 22 19  Temp: 97.8 F (36.6 C) 98.2 F (36.8 C)  SpO2: 100% 98%   Pt is alert and oriented, elderly woman in NAD HEENT: normal Neck: JVP - normal, carotids 2+= with soft bilateral bruits Lungs: CTA bilaterally CV: RRR with distant heart sounds but 2/6  crescendo decrescendo murmur, late peaking with absent A2 at the right upper sternal border Abd: soft, NT, Positive BS, no hepatomegaly Ext: no C/C/E, distal pulses intact and equal.  Right groin site clear. Skin: warm/dry no rash  EKG 08/22/20 shows sinus tachycardia, ST-T changes consider anterolateral ischemia.  Echo personally reviewed. Images demonstrate severe LV dysfunction with LVEF 20-25%. The aortic valve is severely calcified and restricted. The peak transaortic velocity is 4.2 m/s, peak and mean transvalvular gradients are 70/44 mmHg, respectively, and calculated AVA is 0.8 square cm.   Cardiac cath demonstrates patent coronaries with no signifcant obstruction. The aortic valve is severely calcified and restricted on plain fluoroscopy. LVEDP is elevated at 38 mmHg. Peak and maen aortic gradients are 72/52 mmHg, respectively. AVA is 0.4 square cm. CO/CI 3.4 and 2.23 respectively.   The patient has severe aortic stenosis now presenting with acute systolic heart failure and found to have severe global LV systolic dysfunction.  Her symptom onset is quite recent with only 2 weeks of dyspnea, orthopnea, and fatigue predating her hospitalization.  The patient had no viral prodrome.  I suspect her severe LV dysfunction is related to critical aortic stenosis.  She is clinically improved with IV diuresis and the care she has received in the hospital.  Considering her advanced age and severe LV dysfunction, I do not think she would be a candidate for conventional surgical aortic valve replacement.  TAVR is indicated to relieve symptoms, prevent progressive heart failure, and improve mortality.  I have discussed the additional work-up necessary to evaluate her candidacy for TAVR.  She will undergo a CTA of the chest, abdomen, and pelvis.  She will undergo a gated CTA of the heart to assess anatomic suitability for TAVR.  As long as she is clinically stable, will arrange outpatient cardiac surgical  consultation as part of a multidisciplinary approach to her care.  I reviewed the TAVR procedure in detail with the patient and her friend today.  They understand that she will be done via a transfemoral approach as long as her vascular anatomy is suitable.  We discussed risks, indications, and alternatives to TAVR.  They understand risks include vascular injury, bleeding, infection, stroke, myocardial infarction, cardiac injury, cardiac tamponade, device embolization, arrhythmia, need for permanent pacemaker, aortic dissection, emergency cardiac surgery, and death.  They understand these risks are somewhat elevated in the context of her severe LV dysfunction, but overall they remain low and would be anticipated to occur less than 2%.  After full discussion, they provide informed consent and would like to proceed with further evaluation.  Sherren Mocha, M.D. 08/24/2020 7:14 AM

## 2020-08-23 NOTE — H&P (View-Only) (Signed)
Subjective:  No chest pain Breathing better able to lay fairly flat Lives independently No real family Sister in [place] but not close Has good friend [name] who will be living with her  Discussed diagnosis of CHF and AS with her  For right and left cath today    Objective:  Vitals:   08/22/20 2133 08/22/20 2334 08/23/20 0142 08/23/20 0316  BP: 107/72 124/76  109/85  Pulse: (!) 101 (!) 109  (!) 101  Resp: 20 20  19   Temp: 97.9 F (36.6 C) 98 F (36.7 C)  98 F (36.7 C)  TempSrc: Oral Oral  Oral  SpO2: 100% 98%  98%  Weight:   59.6 kg   Height:   4\' 11"  (1.499 m)     Intake/Output from previous day:  Intake/Output Summary (Last 24 hours) at 08/23/2020 1109 Last data filed at 08/23/2020 0900 Gross per 24 hour  Intake 624.76 ml  Output 1150 ml  Net -525.24 ml    Physical Exam: Elderly black female Decreased BS bases Does not have loud AS murmur  Abdomen soft No edema   Lab Results: Basic Metabolic Panel: Recent Labs    08/22/20 0501 08/23/20 0706  NA 145 135  K 4.7 3.6  CL 109 105  CO2 25 19*  GLUCOSE 89 102*  BUN 22 17  CREATININE 1.01* 1.03*  CALCIUM 9.5 8.9   Liver Function Tests: Recent Labs    08/21/20 0725  AST 37  ALT 50*  ALKPHOS 95  BILITOT 1.2  PROT 7.4  ALBUMIN 4.0   No results for input(s): LIPASE, AMYLASE in the last 72 hours. CBC: Recent Labs    08/21/20 0725 08/22/20 0501 08/23/20 0706  WBC 5.9 6.1 5.7  NEUTROABS 4.2  --   --   HGB 13.4 13.0 12.2  HCT 40.6 41.1 37.0  MCV 91.0 93.4 89.2  PLT 253 250 236   Cardiac Enzymes: No results for input(s): CKTOTAL, CKMB, CKMBINDEX, TROPONINI in the last 72 hours. BNP: Invalid input(s): POCBNP D-Dimer: Recent Labs    08/21/20 0728  DDIMER 1.17*   Hemoglobin A1C: No results for input(s): HGBA1C in the last 72 hours. Fasting Lipid Panel: Recent Labs    08/21/20 1834  CHOL 180  HDL 45  LDLCALC 117*  TRIG 90  CHOLHDL 4.0   Thyroid Function Tests: Recent Labs     08/21/20 1834  TSH 2.383   Anemia Panel: No results for input(s): VITAMINB12, FOLATE, FERRITIN, TIBC, IRON, RETICCTPCT in the last 72 hours.  Imaging: CT Angio Chest PE W/Cm &/Or Wo Cm  Result Date: 08/21/2020 CLINICAL DATA:  Shortness of breath and elevated D-dimer levels. EXAM: CT ANGIOGRAPHY CHEST WITH CONTRAST TECHNIQUE: Multidetector CT imaging of the chest was performed using the standard protocol during bolus administration of intravenous contrast. Multiplanar CT image reconstructions and MIPs were obtained to evaluate the vascular anatomy. CONTRAST:  33mL OMNIPAQUE IOHEXOL 350 MG/ML SOLN COMPARISON:  Chest radiograph 08/21/2020 FINDINGS: Despite efforts by the technologist and patient, motion artifact is present on today's exam and could not be eliminated. This reduces exam sensitivity and specificity. Cardiovascular: No filling defect is identified in the pulmonary arterial tree to suggest pulmonary embolus. Coronary, aortic arch, and branch vessel atherosclerotic vascular disease. The aorta and systemic vasculature are not opacified on today's exam which was optimized for pulmonary arterial opacification. Mild cardiomegaly.  Aortic valve calcification. Mediastinum/Nodes: Unremarkable Lungs/Pleura: Small bilateral pleural effusions. Airway thickening is present, suggesting bronchitis or reactive airways disease.  Consolidation in the right lower lobe and to lesser extent posteriorly in the right upper lobe noted on images 42 through 83 of series 5. Upper Abdomen: Unremarkable Musculoskeletal: Severe left degenerative glenohumeral arthropathy. Thoracic spondylosis and thoracic kyphosis. Age indeterminate mild superior endplate compression fracture at L1. Review of the MIP images confirms the above findings. IMPRESSION: 1. No filling defect is identified in the pulmonary arterial tree to suggest pulmonary embolus. 2. Consolidation in the right lower lobe and to a lesser extent posteriorly in the right  upper lobe concerning for multilobar pneumonia. Followup PA and lateral chest X-ray is recommended in 3-4 weeks following trial of antibiotic therapy to ensure resolution and exclude underlying malignancy. 3. Other imaging findings of potential clinical significance: Aortic Atherosclerosis (ICD10-I70.0). Coronary atherosclerosis. Aortic valve calcification. Mild cardiomegaly. Small bilateral pleural effusions. Airway thickening is present, suggesting bronchitis or reactive airways disease. Severe left degenerative glenohumeral arthropathy. Age indeterminate mild superior endplate compression fracture at L1. Electronically Signed   By: Van Clines M.D.   On: 08/21/2020 12:55   ECHOCARDIOGRAM COMPLETE  Result Date: 08/22/2020    ECHOCARDIOGRAM REPORT   Patient Name:   Alexandria Randolph Date of Exam: 08/22/2020 Medical Rec #:  [id number]         Height:       59.0 in Accession #:    [id number]        Weight:       134.5 lb Date of Birth:  1934/10/11          BSA:          1.558 m Patient Age:    86 years          BP:           98/71 mmHg Patient Gender: F                 HR:           93 bpm. Exam Location:  Inpatient Procedure: 2D Echo Indications:    Heart failure  History:        Patient has no prior history of Echocardiogram examinations.                 CHF.  Sonographer:    Roseanna Rainbow Referring Phys: [id number] [place]  1. Left ventricular ejection fraction, by estimation, is 20 to 25%. The left ventricle has severely decreased function. The left ventricle demonstrates global hypokinesis. There is mild left ventricular hypertrophy. Left ventricular diastolic parameters  are consistent with Grade II diastolic dysfunction (pseudonormalization). Elevated left atrial pressure.  2. Right ventricular systolic function is mildly reduced. The right ventricular size is normal. Tricuspid regurgitation signal is inadequate for assessing PA pressure.  3. The mitral valve is normal in structure. Trivial  mitral valve regurgitation.  4. The aortic valve is calcified. There is severe calcifcation of the aortic valve. Aortic valve regurgitation is trivial. Severe aortic valve stenosis. Vmax 4.3 m/s, MG 49 mmHg, AVA 0.7 cm^2, DI 0.26  5. The inferior vena cava is normal in size with <50% respiratory variability, suggesting right atrial pressure of 8 mmHg. FINDINGS  Left Ventricle: Left ventricular ejection fraction, by estimation, is 20 to 25%. The left ventricle has severely decreased function. The left ventricle demonstrates global hypokinesis. The left ventricular internal cavity size was normal in size. There is mild left ventricular hypertrophy. Left ventricular diastolic parameters are consistent with Grade II diastolic dysfunction (pseudonormalization). Elevated left atrial pressure. Right Ventricle:  The right ventricular size is normal. Right vetricular wall thickness was not well visualized. Right ventricular systolic function is mildly reduced. Tricuspid regurgitation signal is inadequate for assessing PA pressure. Left Atrium: Left atrial size was normal in size. Right Atrium: Right atrial size was normal in size. Pericardium: There is no evidence of pericardial effusion. Mitral Valve: The mitral valve is normal in structure. Trivial mitral valve regurgitation. Tricuspid Valve: The tricuspid valve is normal in structure. Tricuspid valve regurgitation is trivial. Aortic Valve: The aortic valve is calcified. There is severe calcifcation of the aortic valve. Aortic valve regurgitation is trivial. Severe aortic stenosis is present. Aortic valve mean gradient measures 40.2 mmHg. Aortic valve peak gradient measures 66.2 mmHg. Aortic valve area, by VTI measures 0.82 cm. Pulmonic Valve: The pulmonic valve was not well visualized. Pulmonic valve regurgitation is not visualized. Aorta: The aortic root and ascending aorta are structurally normal, with no evidence of dilitation. Venous: The inferior vena cava is normal  in size with less than 50% respiratory variability, suggesting right atrial pressure of 8 mmHg. IAS/Shunts: The interatrial septum was not well visualized.  LEFT VENTRICLE PLAX 2D LVIDd:         4.40 cm     Diastology LVIDs:         3.80 cm     LV e' medial:    3.26 cm/s LV PW:         1.30 cm     LV E/e' medial:  31.0 LV IVS:        1.20 cm     LV e' lateral:   4.68 cm/s LVOT diam:     1.90 cm     LV E/e' lateral: 21.6 LV SV:         77 LV SV Index:   49 LVOT Area:     2.84 cm  LV Volumes (MOD) LV vol d, MOD A2C: 79.7 ml LV vol d, MOD A4C: 44.9 ml LV vol s, MOD A2C: 78.1 ml LV vol s, MOD A4C: 55.6 ml LV SV MOD A2C:     1.6 ml LV SV MOD A4C:     44.9 ml LV SV MOD BP:      15.1 ml RIGHT VENTRICLE            IVC RV S prime:     6.31 cm/s  IVC diam: 1.80 cm TAPSE (M-mode): 1.3 cm LEFT ATRIUM             Index       RIGHT ATRIUM          Index LA diam:        3.40 cm 2.18 cm/m  RA Area:     8.95 cm LA Vol (A2C):   29.0 ml 18.61 ml/m RA Volume:   17.40 ml 11.17 ml/m LA Vol (A4C):   40.9 ml 26.25 ml/m LA Biplane Vol: 37.4 ml 24.00 ml/m  AORTIC VALVE                    PULMONIC VALVE AV Area (Vmax):    0.91 cm     PR End Diast Vel: 2.66 msec AV Area (Vmean):   0.84 cm AV Area (VTI):     0.82 cm AV Vmax:           406.80 cm/s AV Vmean:          298.600 cm/s AV VTI:  0.937 m AV Peak Grad:      66.2 mmHg AV Mean Grad:      40.2 mmHg LVOT Vmax:         131.00 cm/s LVOT Vmean:        88.700 cm/s LVOT VTI:          0.271 m LVOT/AV VTI ratio: 0.29  AORTA Ao Root diam: 2.90 cm Ao Asc diam:  3.30 cm MITRAL VALVE MV Area (PHT): 9.85 cm     SHUNTS MV Decel Time: 77 msec      Systemic VTI:  0.27 m MV E velocity: 101.00 cm/s  Systemic Diam: 1.90 cm MV A velocity: 61.30 cm/s MV E/A ratio:  1.65 Oswaldo Milian MD Electronically signed by Oswaldo Milian MD Signature Date/Time: 08/22/2020/5:07:56 PM    Final    VAS [place] LOWER EXTREMITY VENOUS (DVT)  Result Date: 08/22/2020  Lower Venous DVT Study Patient  Name:  Alexandria Randolph  Date of Exam:   08/22/2020 Medical Rec #: [id number]          Accession #:    [id number] Date of Birth: [birth year]           Patient Gender: F Patient Age:   86Y Exam Location:  [facility] Procedure:      VAS [place] LOWER EXTREMITY VENOUS (DVT) Referring Phys: [id number] JARED E SEGAL --------------------------------------------------------------------------------  Indications: Elevated Ddimer.  Risk Factors: None identified. Comparison Study: No prior studies. Performing Technologist: Oliver Hum RVT  Examination Guidelines: A complete evaluation includes B-mode imaging, spectral Doppler, color Doppler, and power Doppler as needed of all accessible portions of each vessel. Bilateral testing is considered an integral part of a complete examination. Limited examinations for reoccurring indications may be performed as noted. The reflux portion of the exam is performed with the patient in reverse Trendelenburg.  +---------+---------------+---------+-----------+----------+--------------+ RIGHT    CompressibilityPhasicitySpontaneityPropertiesThrombus Aging +---------+---------------+---------+-----------+----------+--------------+ CFV      Full           Yes      Yes                                 +---------+---------------+---------+-----------+----------+--------------+ SFJ      Full                                                        +---------+---------------+---------+-----------+----------+--------------+ FV Prox  Full                                                        +---------+---------------+---------+-----------+----------+--------------+ FV Mid   Full                                                        +---------+---------------+---------+-----------+----------+--------------+ FV DistalFull                                                        +---------+---------------+---------+-----------+----------+--------------+  PFV       Full                                                        +---------+---------------+---------+-----------+----------+--------------+ POP      Full           Yes      Yes                                 +---------+---------------+---------+-----------+----------+--------------+ PTV      Full                                                        +---------+---------------+---------+-----------+----------+--------------+ PERO     Full                                                        +---------+---------------+---------+-----------+----------+--------------+   +---------+---------------+---------+-----------+----------+--------------+ LEFT     CompressibilityPhasicitySpontaneityPropertiesThrombus Aging +---------+---------------+---------+-----------+----------+--------------+ CFV      Full           Yes      Yes                                 +---------+---------------+---------+-----------+----------+--------------+ SFJ      Full                                                        +---------+---------------+---------+-----------+----------+--------------+ FV Prox  Full                                                        +---------+---------------+---------+-----------+----------+--------------+ FV Mid   Full                                                        +---------+---------------+---------+-----------+----------+--------------+ FV DistalFull                                                        +---------+---------------+---------+-----------+----------+--------------+ PFV      Full                                                        +---------+---------------+---------+-----------+----------+--------------+  POP      Full           Yes      Yes                                 +---------+---------------+---------+-----------+----------+--------------+ PTV      Full                                                         +---------+---------------+---------+-----------+----------+--------------+ PERO     Full                                                        +---------+---------------+---------+-----------+----------+--------------+     Summary: RIGHT: - There is no evidence of deep vein thrombosis in the lower extremity.  - No cystic structure found in the popliteal fossa.  LEFT: - There is no evidence of deep vein thrombosis in the lower extremity.  - No cystic structure found in the popliteal fossa.  *See table(s) above for measurements and observations. Electronically signed by Ruta Hinds MD on 08/22/2020 at 5:10:02 PM.    Final     Cardiac Studies:  ECG:    Telemetry:  NSR 08/23/2020   Echo: EF 25% diffuse hypokinesis worse in septum severe AS   Medications:   . sodium chloride flush  3 mL Intravenous Q12H  . sodium chloride flush  3 mL Intravenous Q12H     . sodium chloride    . sodium chloride 10 mL/hr at 08/23/20 0557  . heparin 600 Units/hr (08/22/20 1000)    Assessment/Plan:    1. CHF/AS:  New diagnosis diuretics he ld would avoid ARB/ACE/ARNi with fixed AS. ECG with lateral T wave changes Troponin 389 For right and left cath today Suspect she will have some CAD. Discussed possible TAVR evaluation and its complexity with need for structural heart consult and surgical consult. Cath will be late today. She is able to generate peak velocity over 4 m/sec and mean gradient 40 mmHg despite severely depressed EF and valve is calcified with restricted motion. If her CAD is no prohibitive would likely benefit from TAVR Will likely need beta blocker and oral diuretic post cath   [name] 08/23/2020, 11:09 AM

## 2020-08-23 NOTE — Progress Notes (Signed)
Carotid duplex bilateral study completed.   Please see CV Proc for preliminary results.   Jeroline Wolbert, RDMS, RVT  

## 2020-08-23 NOTE — Evaluation (Signed)
Physical Therapy Evaluation Patient Details Name: Alexandria Randolph MRN: [id number] DOB: 08-Aug-1934 Today's Date: 08/23/2020   History of Present Illness  Patient is a 86 y/o female who presents on 08/21/20 as a tx from [facility] due to SOB and weakness. BNP 2105. Chest CTA- multifocal PNA, bil pleral effusions and possible bronchitis. Concern for new CHF, aortic stenosis, Elevated D-dimer.  PMH includes HTN, dyslipidemia, breast ca, tobacco abuse.  Clinical Impression  Patient presents with generalized weakness, impaired balance, decreased activity tolerance, impaired endurance and dyspnea on exertion s/p above. Pt lives home alone and has a neighbor/friend that assists with driving as needed. Can also move in with her if needed and provide more assist at d/c if needed. Today, pt tolerated bed mobility, transfers and gait training with Min A-Min guard for balance/safety. Balance improved with use of RW for support. 3/4 DOE noted with Sp02 >95% on RA throughout. HR in 90s-low 100s and BP with slight drop but stable. Limited mainly by SOB and fatigue. Might benefit from use of RW for support at home. Will follow acutely to maximize independence and mobility prior to return home.    Follow Up Recommendations Home health PT;Supervision - Intermittent    Equipment Recommendations  Rolling walker with 5" wheels (pending improvement)    Recommendations for Other Services       Precautions / Restrictions Precautions Precautions: Fall Restrictions Weight Bearing Restrictions: No      Mobility  Bed Mobility Overal bed mobility: Modified Independent             General bed mobility comments: No assist needed, increased time/effort.    Transfers Overall transfer level: Needs assistance Equipment used: Rolling walker (2 wheeled) Transfers: Sit to/from Stand Sit to Stand: Min assist         General transfer comment: Min A to steady in standing, stood from EOB x1. "I feel like I might  fall"  Ambulation/Gait Ambulation/Gait assistance: Min guard;Min assist Gait Distance (Feet): 40 Feet Assistive device: Rolling walker (2 wheeled);None Gait Pattern/deviations: Step-through pattern;Decreased stride length Gait velocity: decreased Gait velocity interpretation: <1.31 ft/sec, indicative of household ambulator General Gait Details: Slow, guarded and unsteady gait without use of DME, balance improved with use of RW for support. 3/4 DOE. VSS on RA. Sp02 >95% on RA. HRs 90-low 100s bpm.  Stairs            Wheelchair Mobility    Modified Rankin (Stroke Patients Only)       Balance Overall balance assessment: Needs assistance Sitting-balance support: Feet supported;No upper extremity supported Sitting balance-Leahy Scale: Good     Standing balance support: During functional activity Standing balance-Leahy Scale: Poor Standing balance comment: Min A for dynamic standing tasks and does better with BUE support for walking.                             Pertinent Vitals/Pain Pain Assessment: No/denies pain    Home Living Family/patient expects to be discharged to:: Private residence Living Arrangements: Alone Available Help at Discharge: Family;Available PRN/intermittently (has a neighbor that can move in with her and help more as needed) Type of Home: House Home Access: Stairs to enter Entrance Stairs-Rails: Right Entrance Stairs-Number of Steps: 5 Home Layout: One level Home Equipment: None Additional Comments: Roommate lives around the block.    Prior Function Level of Independence: Independent         Comments: Little driving, does own ADLs/IADLs.  No falls in last 6 months. Independent with ambulation.     Hand Dominance   Dominant Hand: Right    Extremity/Trunk Assessment   Upper Extremity Assessment Upper Extremity Assessment: Defer to OT evaluation    Lower Extremity Assessment Lower Extremity Assessment: Generalized  weakness       Communication   Communication: No difficulties  Cognition Arousal/Alertness: Awake/alert Behavior During Therapy: WFL for tasks assessed/performed Overall Cognitive Status: Within Functional Limits for tasks assessed                                        General Comments General comments (skin integrity, edema, etc.): Neighbor/friend present during session, stepped out of room for activity. Bp pre activity 110/79, BP post activity 102/70, no lightheadedness.    Exercises     Assessment/Plan    PT Assessment Patient needs continued PT services  PT Problem List Decreased strength;Decreased mobility;Decreased balance;Cardiopulmonary status limiting activity;Decreased activity tolerance       PT Treatment Interventions Therapeutic exercise;Gait training;Stair training;Functional mobility training;Therapeutic activities;Patient/family education;Balance training;DME instruction    PT Goals (Current goals can be found in the Care Plan section)  Acute Rehab PT Goals Patient Stated Goal: to get better and go home PT Goal Formulation: With patient Time For Goal Achievement: 09/06/20 Potential to Achieve Goals: Good    Frequency Min 3X/week   Barriers to discharge Decreased caregiver support      Co-evaluation               AM-PAC PT "6 Clicks" Mobility  Outcome Measure Help needed turning from your back to your side while in a flat bed without using bedrails?: None Help needed moving from lying on your back to sitting on the side of a flat bed without using bedrails?: A Little Help needed moving to and from a bed to a chair (including a wheelchair)?: A Little Help needed standing up from a chair using your arms (e.g., wheelchair or bedside chair)?: A Little Help needed to walk in hospital room?: A Little Help needed climbing 3-5 steps with a railing? : A Little 6 Click Score: 19    End of Session Equipment Utilized During Treatment:  Gait belt Activity Tolerance: Patient limited by fatigue;Other (comment) (SOB) Patient left: in bed;with call bell/phone within reach;with family/visitor present;with bed alarm set Nurse Communication: Mobility status PT Visit Diagnosis: Muscle weakness (generalized) (M62.81);Difficulty in walking, not elsewhere classified (R26.2);Unsteadiness on feet (R26.81)    Time: 1443-1540 PT Time Calculation (min) (ACUTE ONLY): 22 min   Charges:   PT Evaluation $PT Eval Moderate Complexity: 1 Mod          Marisa Severin, PT, DPT Acute Rehabilitation Services Pager [phone] Office [place] 08/23/2020, 9:53 AM

## 2020-08-23 NOTE — Progress Notes (Signed)
Subjective:  No chest pain Breathing better able to lay fairly flat Lives independently No real family Sister in [place] but not close Has good friend [name] who will be living with her  Discussed diagnosis of CHF and AS with her  For right and left cath today    Objective:  Vitals:   08/22/20 2133 08/22/20 2334 08/23/20 0142 08/23/20 0316  BP: 107/72 124/76  109/85  Pulse: (!) 101 (!) 109  (!) 101  Resp: 20 20  19   Temp: 97.9 F (36.6 C) 98 F (36.7 C)  98 F (36.7 C)  TempSrc: Oral Oral  Oral  SpO2: 100% 98%  98%  Weight:   59.6 kg   Height:   4\' 11"  (1.499 m)     Intake/Output from previous day:  Intake/Output Summary (Last 24 hours) at 08/23/2020 1109 Last data filed at 08/23/2020 0900 Gross per 24 hour  Intake 624.76 ml  Output 1150 ml  Net -525.24 ml    Physical Exam: Elderly black female Decreased BS bases Does not have loud AS murmur  Abdomen soft No edema   Lab Results: Basic Metabolic Panel: Recent Labs    08/22/20 0501 08/23/20 0706  NA 145 135  K 4.7 3.6  CL 109 105  CO2 25 19*  GLUCOSE 89 102*  BUN 22 17  CREATININE 1.01* 1.03*  CALCIUM 9.5 8.9   Liver Function Tests: Recent Labs    08/21/20 0725  AST 37  ALT 50*  ALKPHOS 95  BILITOT 1.2  PROT 7.4  ALBUMIN 4.0   No results for input(s): LIPASE, AMYLASE in the last 72 hours. CBC: Recent Labs    08/21/20 0725 08/22/20 0501 08/23/20 0706  WBC 5.9 6.1 5.7  NEUTROABS 4.2  --   --   HGB 13.4 13.0 12.2  HCT 40.6 41.1 37.0  MCV 91.0 93.4 89.2  PLT 253 250 236   Cardiac Enzymes: No results for input(s): CKTOTAL, CKMB, CKMBINDEX, TROPONINI in the last 72 hours. BNP: Invalid input(s): POCBNP D-Dimer: Recent Labs    08/21/20 0728  DDIMER 1.17*   Hemoglobin A1C: No results for input(s): HGBA1C in the last 72 hours. Fasting Lipid Panel: Recent Labs    08/21/20 1834  CHOL 180  HDL 45  LDLCALC 117*  TRIG 90  CHOLHDL 4.0   Thyroid Function Tests: Recent Labs     08/21/20 1834  TSH 2.383   Anemia Panel: No results for input(s): VITAMINB12, FOLATE, FERRITIN, TIBC, IRON, RETICCTPCT in the last 72 hours.  Imaging: CT Angio Chest PE W/Cm &/Or Wo Cm  Result Date: 08/21/2020 CLINICAL DATA:  Shortness of breath and elevated D-dimer levels. EXAM: CT ANGIOGRAPHY CHEST WITH CONTRAST TECHNIQUE: Multidetector CT imaging of the chest was performed using the standard protocol during bolus administration of intravenous contrast. Multiplanar CT image reconstructions and MIPs were obtained to evaluate the vascular anatomy. CONTRAST:  56mL OMNIPAQUE IOHEXOL 350 MG/ML SOLN COMPARISON:  Chest radiograph 08/21/2020 FINDINGS: Despite efforts by the technologist and patient, motion artifact is present on today's exam and could not be eliminated. This reduces exam sensitivity and specificity. Cardiovascular: No filling defect is identified in the pulmonary arterial tree to suggest pulmonary embolus. Coronary, aortic arch, and branch vessel atherosclerotic vascular disease. The aorta and systemic vasculature are not opacified on today's exam which was optimized for pulmonary arterial opacification. Mild cardiomegaly.  Aortic valve calcification. Mediastinum/Nodes: Unremarkable Lungs/Pleura: Small bilateral pleural effusions. Airway thickening is present, suggesting bronchitis or reactive airways disease.  Consolidation in the right lower lobe and to lesser extent posteriorly in the right upper lobe noted on images 42 through 83 of series 5. Upper Abdomen: Unremarkable Musculoskeletal: Severe left degenerative glenohumeral arthropathy. Thoracic spondylosis and thoracic kyphosis. Age indeterminate mild superior endplate compression fracture at L1. Review of the MIP images confirms the above findings. IMPRESSION: 1. No filling defect is identified in the pulmonary arterial tree to suggest pulmonary embolus. 2. Consolidation in the right lower lobe and to a lesser extent posteriorly in the right  upper lobe concerning for multilobar pneumonia. Followup PA and lateral chest X-ray is recommended in 3-4 weeks following trial of antibiotic therapy to ensure resolution and exclude underlying malignancy. 3. Other imaging findings of potential clinical significance: Aortic Atherosclerosis (ICD10-I70.0). Coronary atherosclerosis. Aortic valve calcification. Mild cardiomegaly. Small bilateral pleural effusions. Airway thickening is present, suggesting bronchitis or reactive airways disease. Severe left degenerative glenohumeral arthropathy. Age indeterminate mild superior endplate compression fracture at L1. Electronically Signed   By: Van Clines M.D.   On: 08/21/2020 12:55   ECHOCARDIOGRAM COMPLETE  Result Date: 08/22/2020    ECHOCARDIOGRAM REPORT   Patient Name:   Alexandria Randolph Date of Exam: 08/22/2020 Medical Rec #:  [id number]         Height:       59.0 in Accession #:    [id number]        Weight:       134.5 lb Date of Birth:  1934/08/27          BSA:          1.558 m Patient Age:    86 years          BP:           98/71 mmHg Patient Gender: F                 HR:           93 bpm. Exam Location:  Inpatient Procedure: 2D Echo Indications:    Heart failure  History:        Patient has no prior history of Echocardiogram examinations.                 CHF.  Sonographer:    Roseanna Rainbow Referring Phys: [id number] [place]  1. Left ventricular ejection fraction, by estimation, is 20 to 25%. The left ventricle has severely decreased function. The left ventricle demonstrates global hypokinesis. There is mild left ventricular hypertrophy. Left ventricular diastolic parameters  are consistent with Grade II diastolic dysfunction (pseudonormalization). Elevated left atrial pressure.  2. Right ventricular systolic function is mildly reduced. The right ventricular size is normal. Tricuspid regurgitation signal is inadequate for assessing PA pressure.  3. The mitral valve is normal in structure. Trivial  mitral valve regurgitation.  4. The aortic valve is calcified. There is severe calcifcation of the aortic valve. Aortic valve regurgitation is trivial. Severe aortic valve stenosis. Vmax 4.3 m/s, MG 49 mmHg, AVA 0.7 cm^2, DI 0.26  5. The inferior vena cava is normal in size with <50% respiratory variability, suggesting right atrial pressure of 8 mmHg. FINDINGS  Left Ventricle: Left ventricular ejection fraction, by estimation, is 20 to 25%. The left ventricle has severely decreased function. The left ventricle demonstrates global hypokinesis. The left ventricular internal cavity size was normal in size. There is mild left ventricular hypertrophy. Left ventricular diastolic parameters are consistent with Grade II diastolic dysfunction (pseudonormalization). Elevated left atrial pressure. Right Ventricle:  The right ventricular size is normal. Right vetricular wall thickness was not well visualized. Right ventricular systolic function is mildly reduced. Tricuspid regurgitation signal is inadequate for assessing PA pressure. Left Atrium: Left atrial size was normal in size. Right Atrium: Right atrial size was normal in size. Pericardium: There is no evidence of pericardial effusion. Mitral Valve: The mitral valve is normal in structure. Trivial mitral valve regurgitation. Tricuspid Valve: The tricuspid valve is normal in structure. Tricuspid valve regurgitation is trivial. Aortic Valve: The aortic valve is calcified. There is severe calcifcation of the aortic valve. Aortic valve regurgitation is trivial. Severe aortic stenosis is present. Aortic valve mean gradient measures 40.2 mmHg. Aortic valve peak gradient measures 66.2 mmHg. Aortic valve area, by VTI measures 0.82 cm. Pulmonic Valve: The pulmonic valve was not well visualized. Pulmonic valve regurgitation is not visualized. Aorta: The aortic root and ascending aorta are structurally normal, with no evidence of dilitation. Venous: The inferior vena cava is normal  in size with less than 50% respiratory variability, suggesting right atrial pressure of 8 mmHg. IAS/Shunts: The interatrial septum was not well visualized.  LEFT VENTRICLE PLAX 2D LVIDd:         4.40 cm     Diastology LVIDs:         3.80 cm     LV e' medial:    3.26 cm/s LV PW:         1.30 cm     LV E/e' medial:  31.0 LV IVS:        1.20 cm     LV e' lateral:   4.68 cm/s LVOT diam:     1.90 cm     LV E/e' lateral: 21.6 LV SV:         77 LV SV Index:   49 LVOT Area:     2.84 cm  LV Volumes (MOD) LV vol d, MOD A2C: 79.7 ml LV vol d, MOD A4C: 44.9 ml LV vol s, MOD A2C: 78.1 ml LV vol s, MOD A4C: 55.6 ml LV SV MOD A2C:     1.6 ml LV SV MOD A4C:     44.9 ml LV SV MOD BP:      15.1 ml RIGHT VENTRICLE            IVC RV S prime:     6.31 cm/s  IVC diam: 1.80 cm TAPSE (M-mode): 1.3 cm LEFT ATRIUM             Index       RIGHT ATRIUM          Index LA diam:        3.40 cm 2.18 cm/m  RA Area:     8.95 cm LA Vol (A2C):   29.0 ml 18.61 ml/m RA Volume:   17.40 ml 11.17 ml/m LA Vol (A4C):   40.9 ml 26.25 ml/m LA Biplane Vol: 37.4 ml 24.00 ml/m  AORTIC VALVE                    PULMONIC VALVE AV Area (Vmax):    0.91 cm     PR End Diast Vel: 2.66 msec AV Area (Vmean):   0.84 cm AV Area (VTI):     0.82 cm AV Vmax:           406.80 cm/s AV Vmean:          298.600 cm/s AV VTI:  0.937 m AV Peak Grad:      66.2 mmHg AV Mean Grad:      40.2 mmHg LVOT Vmax:         131.00 cm/s LVOT Vmean:        88.700 cm/s LVOT VTI:          0.271 m LVOT/AV VTI ratio: 0.29  AORTA Ao Root diam: 2.90 cm Ao Asc diam:  3.30 cm MITRAL VALVE MV Area (PHT): 9.85 cm     SHUNTS MV Decel Time: 77 msec      Systemic VTI:  0.27 m MV E velocity: 101.00 cm/s  Systemic Diam: 1.90 cm MV A velocity: 61.30 cm/s MV E/A ratio:  1.65 Oswaldo Milian MD Electronically signed by Oswaldo Milian MD Signature Date/Time: 08/22/2020/5:07:56 PM    Final    VAS [place] LOWER EXTREMITY VENOUS (DVT)  Result Date: 08/22/2020  Lower Venous DVT Study Patient  Name:  TYLIAH SCHLERETH  Date of Exam:   08/22/2020 Medical Rec #: [id number]          Accession #:    [id number] Date of Birth: 07-22-1934           Patient Gender: F Patient Age:   86Y Exam Location:  [facility] Procedure:      VAS [place] LOWER EXTREMITY VENOUS (DVT) Referring Phys: [id number] JARED E SEGAL --------------------------------------------------------------------------------  Indications: Elevated Ddimer.  Risk Factors: None identified. Comparison Study: No prior studies. Performing Technologist: Oliver Hum RVT  Examination Guidelines: A complete evaluation includes B-mode imaging, spectral Doppler, color Doppler, and power Doppler as needed of all accessible portions of each vessel. Bilateral testing is considered an integral part of a complete examination. Limited examinations for reoccurring indications may be performed as noted. The reflux portion of the exam is performed with the patient in reverse Trendelenburg.  +---------+---------------+---------+-----------+----------+--------------+ RIGHT    CompressibilityPhasicitySpontaneityPropertiesThrombus Aging +---------+---------------+---------+-----------+----------+--------------+ CFV      Full           Yes      Yes                                 +---------+---------------+---------+-----------+----------+--------------+ SFJ      Full                                                        +---------+---------------+---------+-----------+----------+--------------+ FV Prox  Full                                                        +---------+---------------+---------+-----------+----------+--------------+ FV Mid   Full                                                        +---------+---------------+---------+-----------+----------+--------------+ FV DistalFull                                                        +---------+---------------+---------+-----------+----------+--------------+  PFV       Full                                                        +---------+---------------+---------+-----------+----------+--------------+ POP      Full           Yes      Yes                                 +---------+---------------+---------+-----------+----------+--------------+ PTV      Full                                                        +---------+---------------+---------+-----------+----------+--------------+ PERO     Full                                                        +---------+---------------+---------+-----------+----------+--------------+   +---------+---------------+---------+-----------+----------+--------------+ LEFT     CompressibilityPhasicitySpontaneityPropertiesThrombus Aging +---------+---------------+---------+-----------+----------+--------------+ CFV      Full           Yes      Yes                                 +---------+---------------+---------+-----------+----------+--------------+ SFJ      Full                                                        +---------+---------------+---------+-----------+----------+--------------+ FV Prox  Full                                                        +---------+---------------+---------+-----------+----------+--------------+ FV Mid   Full                                                        +---------+---------------+---------+-----------+----------+--------------+ FV DistalFull                                                        +---------+---------------+---------+-----------+----------+--------------+ PFV      Full                                                        +---------+---------------+---------+-----------+----------+--------------+  POP      Full           Yes      Yes                                 +---------+---------------+---------+-----------+----------+--------------+ PTV      Full                                                         +---------+---------------+---------+-----------+----------+--------------+ PERO     Full                                                        +---------+---------------+---------+-----------+----------+--------------+     Summary: RIGHT: - There is no evidence of deep vein thrombosis in the lower extremity.  - No cystic structure found in the popliteal fossa.  LEFT: - There is no evidence of deep vein thrombosis in the lower extremity.  - No cystic structure found in the popliteal fossa.  *See table(s) above for measurements and observations. Electronically signed by Ruta Hinds MD on 08/22/2020 at 5:10:02 PM.    Final     Cardiac Studies:  ECG:    Telemetry:  NSR 08/23/2020   Echo: EF 25% diffuse hypokinesis worse in septum severe AS   Medications:   . sodium chloride flush  3 mL Intravenous Q12H  . sodium chloride flush  3 mL Intravenous Q12H     . sodium chloride    . sodium chloride 10 mL/hr at 08/23/20 0557  . heparin 600 Units/hr (08/22/20 1000)    Assessment/Plan:    1. CHF/AS:  New diagnosis diuretics he ld would avoid ARB/ACE/ARNi with fixed AS. ECG with lateral T wave changes Troponin 389 For right and left cath today Suspect she will have some CAD. Discussed possible TAVR evaluation and its complexity with need for structural heart consult and surgical consult. Cath will be late today. She is able to generate peak velocity over 4 m/sec and mean gradient 40 mmHg despite severely depressed EF and valve is calcified with restricted motion. If her CAD is no prohibitive would likely benefit from TAVR Will likely need beta blocker and oral diuretic post cath   [name] 08/23/2020, 11:09 AM

## 2020-08-23 NOTE — Interval H&P Note (Signed)
Cath Lab Visit (complete for each Cath Lab visit)  Clinical Evaluation Leading to the Procedure:   ACS: Yes.    Non-ACS:    Anginal Classification: CCS II  Anti-ischemic medical therapy: No Therapy  Non-Invasive Test Results: No non-invasive testing performed  Prior CABG: No previous CABG      History and Physical Interval Note:  08/23/2020 3:53 PM  Alexandria Randolph  has presented today for surgery, with the diagnosis of HF.  The various methods of treatment have been discussed with the patient and family. After consideration of risks, benefits and other options for treatment, the patient has consented to  Procedure(s): RIGHT/LEFT HEART CATH AND CORONARY ANGIOGRAPHY (N/A) as a surgical intervention.  The patient's history has been reviewed, patient examined, no change in status, stable for surgery.  I have reviewed the patient's chart and labs.  Questions were answered to the patient's satisfaction.     Quay Burow

## 2020-08-23 NOTE — Progress Notes (Signed)
Heart Failure Stewardship Pharmacist Progress Note   PCP: Marrian Salvage, FNP PCP-Cardiologist: None    HPI:  86 yo F with PMH of breast cancer currently on letrozole, HTN, HLD, hyperglycemia, and remote history of tobacco abuse. She presented to the ED on 6/4 with shortness of breath x 1-2 weeks. CTA negative for PE. BNP elevated at 2105. CXR without edema or airspace opacity. ECHO on 6/5 showed severely reduced LVEF of 20-25% and G2DD with mildly reduced RV systolic function. Pending R/LHC today.  Current HF Medications: None  Prior to admission HF Medications: None  Pertinent Lab Values: . Serum creatinine 1.03, BUN 17, Potassium 3.6, Sodium 135, BNP 2105  Vital Signs: . Weight: 131 lbs (admission weight: 134 lbs) . Blood pressure: 90-100/60s  . Heart rate: 80-100s   Medication Assistance / Insurance Benefits Check: Does the patient have prescription insurance?  Yes Type of insurance plan: [employer]  Outpatient Pharmacy:  [facility] to admission [facility] Is the patient willing to use [facility] at discharge? Yes Is the patient willing to transition their outpatient pharmacy to utilize a [facility] outpatient pharmacy?   Pending   Assessment: 1. Acute systolic CHF (EF 07-62%), pending R/LHC today. NYHA class II symptoms. - IV lasix stopped - per MD: no diuretics given severe AS - Consider starting low dose metoprolol XL post cath today - BP too soft to add ARB/spironolactone at this time - Can consider adding Farxiga 10 mg daily prior to discharge    Plan: 1) Medication changes recommended at this time: - None pending cath today  2) Patient assistance: - Farxiga/Jardiance require prior authorization - can complete if added  3)  Education  - To be completed prior to discharge  Kerby Nora, PharmD, BCPS Heart Failure Stewardship Pharmacist Phone [phone]

## 2020-08-23 NOTE — Progress Notes (Signed)
[facility] PROGRESS NOTE    Progress Note  CAIDYN BLOSSOM  NUU:[id number] DOB: 1934/03/26 DOA: 08/21/2020 PCP: Marrian Salvage, FNP     Brief Narrative:   TIFFINY WORTHY is an 86 y.o. female past medical history of breast cancer currently on letrozole, remote history of tobacco abuse comes in with 1 to 2 weeks of shortness of breath on exertion.  2D echo was done that showed an EF of 25% and grade 2 diastolic heart failure, with severe aortic stenosis with a gradient of 40 mmHg, cardiology recommended transfer to [facility].   Assessment/Plan:   Severe aortic stenosis: 2D echo shows severe stenosis, patient for left and right cardiac cath today 08/23/2020. Currently on IV heparin, will need evaluation for TAVR's. Further evaluation and management per cardiology. Will discontinue IV antibiotics she does not have pneumonia afebrile with no leukocytosis and at low yield procalcitonin.  Chronic systolic and diastolic heart failure: Currently on no diuretic therapy due to her severe AS, on metoprolol 5 mg as needed.  Elevated troponins Likely demand ischemia in the setting of severe aortic stenosis. On IV heparin for cardiac cath today.  History of breast cancer: Noted hold antineoplastic medications.  Elevated D-dimer: CT angio of the chest negative for PE, lower extremity Doppler was also negative for DVT.    DVT prophylaxis: heparin Family Communication:none Status is: Inpatient  Remains inpatient appropriate because:Hemodynamically unstable   Dispo: The patient is from: Home              Anticipated d/c is to: Home              Patient currently is not medically stable to d/c.   Difficult to place patient No        Code Status:     Code Status Orders  (From admission, onward)         Start     Ordered   08/21/20 1612  Do not attempt resuscitation (DNR)  Continuous       Question Answer Comment  In the event of cardiac or respiratory ARREST Do  not call a "code blue"   In the event of cardiac or respiratory ARREST Do not perform Intubation, CPR, defibrillation or ACLS   In the event of cardiac or respiratory ARREST Use medication by any route, position, wound care, and other measures to relive pain and suffering. May use oxygen, suction and manual treatment of airway obstruction as needed for comfort.      08/21/20 1611        Code Status History    This patient has a current code status but no historical code status.   Advance Care Planning Activity        IV Access:    Peripheral IV   Procedures and diagnostic studies:   CT Angio Chest PE W/Cm &/Or Wo Cm  Result Date: 08/21/2020 CLINICAL DATA:  Shortness of breath and elevated D-dimer levels. EXAM: CT ANGIOGRAPHY CHEST WITH CONTRAST TECHNIQUE: Multidetector CT imaging of the chest was performed using the standard protocol during bolus administration of intravenous contrast. Multiplanar CT image reconstructions and MIPs were obtained to evaluate the vascular anatomy. CONTRAST:  61mL OMNIPAQUE IOHEXOL 350 MG/ML SOLN COMPARISON:  Chest radiograph 08/21/2020 FINDINGS: Despite efforts by the technologist and patient, motion artifact is present on today's exam and could not be eliminated. This reduces exam sensitivity and specificity. Cardiovascular: No filling defect is identified in the pulmonary arterial tree to suggest pulmonary embolus. Coronary,  aortic arch, and branch vessel atherosclerotic vascular disease. The aorta and systemic vasculature are not opacified on today's exam which was optimized for pulmonary arterial opacification. Mild cardiomegaly.  Aortic valve calcification. Mediastinum/Nodes: Unremarkable Lungs/Pleura: Small bilateral pleural effusions. Airway thickening is present, suggesting bronchitis or reactive airways disease. Consolidation in the right lower lobe and to lesser extent posteriorly in the right upper lobe noted on images 42 through 83 of series 5.  Upper Abdomen: Unremarkable Musculoskeletal: Severe left degenerative glenohumeral arthropathy. Thoracic spondylosis and thoracic kyphosis. Age indeterminate mild superior endplate compression fracture at L1. Review of the MIP images confirms the above findings. IMPRESSION: 1. No filling defect is identified in the pulmonary arterial tree to suggest pulmonary embolus. 2. Consolidation in the right lower lobe and to a lesser extent posteriorly in the right upper lobe concerning for multilobar pneumonia. Followup PA and lateral chest X-ray is recommended in 3-4 weeks following trial of antibiotic therapy to ensure resolution and exclude underlying malignancy. 3. Other imaging findings of potential clinical significance: Aortic Atherosclerosis (ICD10-I70.0). Coronary atherosclerosis. Aortic valve calcification. Mild cardiomegaly. Small bilateral pleural effusions. Airway thickening is present, suggesting bronchitis or reactive airways disease. Severe left degenerative glenohumeral arthropathy. Age indeterminate mild superior endplate compression fracture at L1. Electronically Signed   By: Van Clines M.D.   On: 08/21/2020 12:55   DG Chest Port 1 View  Result Date: 08/21/2020 CLINICAL DATA:  Shortness of breath EXAM: PORTABLE CHEST 1 VIEW COMPARISON:  November 01, 2014 FINDINGS: There is no edema or airspace opacity. Heart is mildly enlarged with pulmonary vascularity normal. No adenopathy. There is degenerative change in the thoracic spine. Clips noted in right breast region. IMPRESSION: No edema or airspace opacity.  Heart mildly enlarged. Electronically Signed   By: Lowella Grip III M.D.   On: 08/21/2020 08:08   ECHOCARDIOGRAM COMPLETE  Result Date: 08/22/2020    ECHOCARDIOGRAM REPORT   Patient Name:   JELESA MANGINI Date of Exam: 08/22/2020 Medical Rec #:  [id number]         Height:       59.0 in Accession #:    [id number]        Weight:       134.5 lb Date of Birth:  1934-11-14          BSA:           1.558 m Patient Age:    76 years          BP:           98/71 mmHg Patient Gender: F                 HR:           93 bpm. Exam Location:  Inpatient Procedure: 2D Echo Indications:    Heart failure  History:        Patient has no prior history of Echocardiogram examinations.                 CHF.  Sonographer:    Roseanna Rainbow Referring Phys: [id number] [place]  1. Left ventricular ejection fraction, by estimation, is 20 to 25%. The left ventricle has severely decreased function. The left ventricle demonstrates global hypokinesis. There is mild left ventricular hypertrophy. Left ventricular diastolic parameters  are consistent with Grade II diastolic dysfunction (pseudonormalization). Elevated left atrial pressure.  2. Right ventricular systolic function is mildly reduced. The right ventricular size is normal. Tricuspid regurgitation signal is inadequate for  assessing PA pressure.  3. The mitral valve is normal in structure. Trivial mitral valve regurgitation.  4. The aortic valve is calcified. There is severe calcifcation of the aortic valve. Aortic valve regurgitation is trivial. Severe aortic valve stenosis. Vmax 4.3 m/s, MG 49 mmHg, AVA 0.7 cm^2, DI 0.26  5. The inferior vena cava is normal in size with <50% respiratory variability, suggesting right atrial pressure of 8 mmHg. FINDINGS  Left Ventricle: Left ventricular ejection fraction, by estimation, is 20 to 25%. The left ventricle has severely decreased function. The left ventricle demonstrates global hypokinesis. The left ventricular internal cavity size was normal in size. There is mild left ventricular hypertrophy. Left ventricular diastolic parameters are consistent with Grade II diastolic dysfunction (pseudonormalization). Elevated left atrial pressure. Right Ventricle: The right ventricular size is normal. Right vetricular wall thickness was not well visualized. Right ventricular systolic function is mildly reduced. Tricuspid regurgitation  signal is inadequate for assessing PA pressure. Left Atrium: Left atrial size was normal in size. Right Atrium: Right atrial size was normal in size. Pericardium: There is no evidence of pericardial effusion. Mitral Valve: The mitral valve is normal in structure. Trivial mitral valve regurgitation. Tricuspid Valve: The tricuspid valve is normal in structure. Tricuspid valve regurgitation is trivial. Aortic Valve: The aortic valve is calcified. There is severe calcifcation of the aortic valve. Aortic valve regurgitation is trivial. Severe aortic stenosis is present. Aortic valve mean gradient measures 40.2 mmHg. Aortic valve peak gradient measures 66.2 mmHg. Aortic valve area, by VTI measures 0.82 cm. Pulmonic Valve: The pulmonic valve was not well visualized. Pulmonic valve regurgitation is not visualized. Aorta: The aortic root and ascending aorta are structurally normal, with no evidence of dilitation. Venous: The inferior vena cava is normal in size with less than 50% respiratory variability, suggesting right atrial pressure of 8 mmHg. IAS/Shunts: The interatrial septum was not well visualized.  LEFT VENTRICLE PLAX 2D LVIDd:         4.40 cm     Diastology LVIDs:         3.80 cm     LV e' medial:    3.26 cm/s LV PW:         1.30 cm     LV E/e' medial:  31.0 LV IVS:        1.20 cm     LV e' lateral:   4.68 cm/s LVOT diam:     1.90 cm     LV E/e' lateral: 21.6 LV SV:         77 LV SV Index:   49 LVOT Area:     2.84 cm  LV Volumes (MOD) LV vol d, MOD A2C: 79.7 ml LV vol d, MOD A4C: 44.9 ml LV vol s, MOD A2C: 78.1 ml LV vol s, MOD A4C: 55.6 ml LV SV MOD A2C:     1.6 ml LV SV MOD A4C:     44.9 ml LV SV MOD BP:      15.1 ml RIGHT VENTRICLE            IVC RV S prime:     6.31 cm/s  IVC diam: 1.80 cm TAPSE (M-mode): 1.3 cm LEFT ATRIUM             Index       RIGHT ATRIUM          Index LA diam:        3.40 cm 2.18 cm/m  RA Area:  8.95 cm LA Vol (A2C):   29.0 ml 18.61 ml/m RA Volume:   17.40 ml 11.17 ml/m LA Vol  (A4C):   40.9 ml 26.25 ml/m LA Biplane Vol: 37.4 ml 24.00 ml/m  AORTIC VALVE                    PULMONIC VALVE AV Area (Vmax):    0.91 cm     PR End Diast Vel: 2.66 msec AV Area (Vmean):   0.84 cm AV Area (VTI):     0.82 cm AV Vmax:           406.80 cm/s AV Vmean:          298.600 cm/s AV VTI:            0.937 m AV Peak Grad:      66.2 mmHg AV Mean Grad:      40.2 mmHg LVOT Vmax:         131.00 cm/s LVOT Vmean:        88.700 cm/s LVOT VTI:          0.271 m LVOT/AV VTI ratio: 0.29  AORTA Ao Root diam: 2.90 cm Ao Asc diam:  3.30 cm MITRAL VALVE MV Area (PHT): 9.85 cm     SHUNTS MV Decel Time: 77 msec      Systemic VTI:  0.27 m MV E velocity: 101.00 cm/s  Systemic Diam: 1.90 cm MV A velocity: 61.30 cm/s MV E/A ratio:  1.65 Oswaldo Milian MD Electronically signed by Oswaldo Milian MD Signature Date/Time: 08/22/2020/5:07:56 PM    Final    VAS [place] LOWER EXTREMITY VENOUS (DVT)  Result Date: 08/22/2020  Lower Venous DVT Study Patient Name:  DEONA NOVITSKI  Date of Exam:   08/22/2020 Medical Rec #: [id number]          Accession #:    [id number] Date of Birth: September 16, 1934           Patient Gender: F Patient Age:   89F Exam Location:  [facility] Procedure:      VAS [place] LOWER EXTREMITY VENOUS (DVT) Referring Phys: [id number] [place] --------------------------------------------------------------------------------  Indications: Elevated Ddimer.  Risk Factors: None identified. Comparison Study: No prior studies. Performing Technologist: Oliver Hum RVT  Examination Guidelines: A complete evaluation includes B-mode imaging, spectral Doppler, color Doppler, and power Doppler as needed of all accessible portions of each vessel. Bilateral testing is considered an integral part of a complete examination. Limited examinations for reoccurring indications may be performed as noted. The reflux portion of the exam is performed with the patient in reverse Trendelenburg.   +---------+---------------+---------+-----------+----------+--------------+ RIGHT    CompressibilityPhasicitySpontaneityPropertiesThrombus Aging +---------+---------------+---------+-----------+----------+--------------+ CFV      Full           Yes      Yes                                 +---------+---------------+---------+-----------+----------+--------------+ SFJ      Full                                                        +---------+---------------+---------+-----------+----------+--------------+ FV Prox  Full                                                        +---------+---------------+---------+-----------+----------+--------------+  FV Mid   Full                                                        +---------+---------------+---------+-----------+----------+--------------+ FV DistalFull                                                        +---------+---------------+---------+-----------+----------+--------------+ PFV      Full                                                        +---------+---------------+---------+-----------+----------+--------------+ POP      Full           Yes      Yes                                 +---------+---------------+---------+-----------+----------+--------------+ PTV      Full                                                        +---------+---------------+---------+-----------+----------+--------------+ PERO     Full                                                        +---------+---------------+---------+-----------+----------+--------------+   +---------+---------------+---------+-----------+----------+--------------+ LEFT     CompressibilityPhasicitySpontaneityPropertiesThrombus Aging +---------+---------------+---------+-----------+----------+--------------+ CFV      Full           Yes      Yes                                  +---------+---------------+---------+-----------+----------+--------------+ SFJ      Full                                                        +---------+---------------+---------+-----------+----------+--------------+ FV Prox  Full                                                        +---------+---------------+---------+-----------+----------+--------------+ FV Mid   Full                                                        +---------+---------------+---------+-----------+----------+--------------+  FV DistalFull                                                        +---------+---------------+---------+-----------+----------+--------------+ PFV      Full                                                        +---------+---------------+---------+-----------+----------+--------------+ POP      Full           Yes      Yes                                 +---------+---------------+---------+-----------+----------+--------------+ PTV      Full                                                        +---------+---------------+---------+-----------+----------+--------------+ PERO     Full                                                        +---------+---------------+---------+-----------+----------+--------------+     Summary: RIGHT: - There is no evidence of deep vein thrombosis in the lower extremity.  - No cystic structure found in the popliteal fossa.  LEFT: - There is no evidence of deep vein thrombosis in the lower extremity.  - No cystic structure found in the popliteal fossa.  *See table(s) above for measurements and observations. Electronically signed by Ruta Hinds MD on 08/22/2020 at 5:10:02 PM.    Final      Medical Consultants:    None.   Subjective:    Candie Echevaria she is currently asymptomatic no new complaints.  Objective:    Vitals:   08/22/20 2133 08/22/20 2334 08/23/20 0142 08/23/20 0316  BP: 107/72 124/76  109/85   Pulse: (!) 101 (!) 109  (!) 101  Resp: 20 20  19   Temp: 97.9 F (36.6 C) 98 F (36.7 C)  98 F (36.7 C)  TempSrc: Oral Oral  Oral  SpO2: 100% 98%  98%  Weight:   59.6 kg   Height:   4\' 11"  (1.499 m)    SpO2: 98 %   Intake/Output Summary (Last 24 hours) at 08/23/2020 0706 Last data filed at 08/23/2020 0557 Gross per 24 hour  Intake 864.76 ml  Output 1150 ml  Net -285.24 ml   Filed Weights   08/21/20 0650 08/22/20 0500 08/23/20 0142  Weight: 56.7 kg 61 kg 59.6 kg    Exam: General exam: In no acute distress. Respiratory system: Good air movement and clear to auscultation. Cardiovascular system: S1 & S2 heard, RRR. No JVD. Gastrointestinal system: Abdomen is nondistended, soft and nontender.  Extremities: No pedal edema. Skin: No rashes, lesions or ulcers Psychiatry: Judgement and insight appear normal. Mood &  affect appropriate.    Data Reviewed:    Labs: Basic Metabolic Panel: Recent Labs  Lab 08/21/20 0725 08/22/20 0501  NA 140 145  K 4.1 4.7  CL 107 109  CO2 19* 25  GLUCOSE 148* 89  BUN 22 22  CREATININE 1.19* 1.01*  CALCIUM 9.6 9.5   GFR Estimated Creatinine Clearance: 32.6 mL/min (A) (by C-G formula based on SCr of 1.01 mg/dL (H)). Liver Function Tests: Recent Labs  Lab 08/21/20 0725  AST 37  ALT 50*  ALKPHOS 95  BILITOT 1.2  PROT 7.4  ALBUMIN 4.0   No results for input(s): LIPASE, AMYLASE in the last 168 hours. No results for input(s): AMMONIA in the last 168 hours. Coagulation profile No results for input(s): INR, PROTIME in the last 168 hours. COVID-19 Labs  Recent Labs    08/21/20 0728  DDIMER 1.17*    Lab Results  Component Value Date   SARSCOV2NAA NEGATIVE 08/21/2020    CBC: Recent Labs  Lab 08/21/20 0725 08/22/20 0501  WBC 5.9 6.1  NEUTROABS 4.2  --   HGB 13.4 13.0  HCT 40.6 41.1  MCV 91.0 93.4  PLT 253 250   Cardiac Enzymes: No results for input(s): CKTOTAL, CKMB, CKMBINDEX, TROPONINI in the last 168 hours. BNP  (last 3 results) No results for input(s): PROBNP in the last 8760 hours. CBG: No results for input(s): GLUCAP in the last 168 hours. D-Dimer: Recent Labs    08/21/20 0728  DDIMER 1.17*   Hgb A1c: No results for input(s): HGBA1C in the last 72 hours. Lipid Profile: Recent Labs    08/21/20 1834  CHOL 180  HDL 45  LDLCALC 117*  TRIG 90  CHOLHDL 4.0   Thyroid function studies: Recent Labs    08/21/20 1834  TSH 2.383   Anemia work up: No results for input(s): VITAMINB12, FOLATE, FERRITIN, TIBC, IRON, RETICCTPCT in the last 72 hours. Sepsis Labs: Recent Labs  Lab 08/21/20 0725 08/22/20 0501  PROCALCITON  --  0.13  WBC 5.9 6.1   Microbiology Recent Results (from the past 240 hour(s))  Resp Panel by RT-PCR (Flu A&B, Covid) Nasopharyngeal Swab     Status: None   Collection Time: 08/21/20  7:54 AM   Specimen: Nasopharyngeal Swab; Nasopharyngeal(NP) swabs in vial transport medium  Result Value Ref Range Status   SARS Coronavirus 2 by RT PCR NEGATIVE NEGATIVE Final    Comment: (NOTE) SARS-CoV-2 target nucleic acids are NOT DETECTED.  The SARS-CoV-2 RNA is generally detectable in upper respiratory specimens during the acute phase of infection. The lowest concentration of SARS-CoV-2 viral copies this assay can detect is 138 copies/mL. A negative result does not preclude SARS-Cov-2 infection and should not be used as the sole basis for treatment or other patient management decisions. A negative result may occur with  improper specimen collection/handling, submission of specimen other than nasopharyngeal swab, presence of viral mutation(s) within the areas targeted by this assay, and inadequate number of viral copies(<138 copies/mL). A negative result must be combined with clinical observations, patient history, and epidemiological information. The expected result is Negative.  Fact Sheet for Patients:  EntrepreneurPulse.com.au  Fact Sheet for  Healthcare Providers:  IncredibleEmployment.be  This test is no t yet approved or cleared by the [place] FDA and  has been authorized for detection and/or diagnosis of SARS-CoV-2 by FDA under an Emergency Use Authorization (EUA). This EUA will remain  in effect (meaning this test can be used) for the duration of the COVID-19  declaration under Section 564(b)(1) of the Act, 21 U.S.C.section 360bbb-3(b)(1), unless the authorization is terminated  or revoked sooner.       Influenza A by PCR NEGATIVE NEGATIVE Final   Influenza B by PCR NEGATIVE NEGATIVE Final    Comment: (NOTE) The Xpert Xpress SARS-CoV-2/FLU/RSV plus assay is intended as an aid in the diagnosis of influenza from Nasopharyngeal swab specimens and should not be used as a sole basis for treatment. Nasal washings and aspirates are unacceptable for Xpert Xpress SARS-CoV-2/FLU/RSV testing.  Fact Sheet for Patients: EntrepreneurPulse.com.au  Fact Sheet for Healthcare Providers: IncredibleEmployment.be  This test is not yet approved or cleared by the [place] FDA and has been authorized for detection and/or diagnosis of SARS-CoV-2 by FDA under an Emergency Use Authorization (EUA). This EUA will remain in effect (meaning this test can be used) for the duration of the COVID-19 declaration under Section 564(b)(1) of the Act, 21 U.S.C. section 360bbb-3(b)(1), unless the authorization is terminated or revoked.  Performed at [facility], [place] [address] [place], [place] [address]      Medications:   . azithromycin  500 mg Oral Daily  . cefdinir  300 mg Oral Q12H  . sodium chloride flush  3 mL Intravenous Q12H  . sodium chloride flush  3 mL Intravenous Q12H   Continuous Infusions: . sodium chloride    . sodium chloride 10 mL/hr at 08/23/20 0557  . heparin 600 Units/hr (08/22/20 1000)      LOS: 2 days   Charlynne Cousins  [facility]  08/23/2020, 7:06 AM

## 2020-08-23 NOTE — Progress Notes (Signed)
ANTICOAGULATION CONSULT NOTE - Follow Up Consult  Pharmacy Consult for Heparin Indication: chest pain/ACS  Allergies  Allergen Reactions  . Losartan Potassium     REACTION: sob  . Olmesartan Medoxomil     REACTION: sob  . Simvastatin     REACTION: Malgias    Patient Measurements: Height: 4\' 11"  (149.9 cm) Weight: 59.6 kg (131 lb 8 oz) IBW/kg (Calculated) : 43.2 Heparin Dosing Weight: 55 kg  Vital Signs: Temp: 98 F (36.7 C) (06/06 0316) Temp Source: Oral (06/06 0316) BP: 109/85 (06/06 0316) Pulse Rate: 101 (06/06 0316)  Labs: Recent Labs    08/21/20 0725 08/21/20 1558 08/21/20 1834 08/22/20 0501 08/22/20 0829 08/22/20 1802 08/23/20 0706  HGB 13.4  --   --  13.0  --   --  12.2  HCT 40.6  --   --  41.1  --   --  37.0  PLT 253  --   --  250  --   --  236  HEPARINUNFRC  --   --   --   --  0.85* 0.32 0.30  CREATININE 1.19*  --   --  1.01*  --   --  1.03*  TROPONINIHS  --  372* 389*  --   --   --   --     Estimated Creatinine Clearance: 32 mL/min (A) (by C-G formula based on SCr of 1.03 mg/dL (H)).  Assessment: 86 yo F who presented with shortness of breath & cough.  Noted to have multiple cardiovascular risk factors, EKG changes, elevated troponin & BNP. Pharmacy consulted to dose heparin.  Not on anticoagulation PTA.  CTA negative for PE, duplex negative for DVT.   Heparin level is low therapeutic (0.32) on 600 units/hr.  Cardiac cath planned for today.    Goal of Therapy:  Heparin level 0.3-0.7 units/ml Monitor platelets by anticoagulation protocol: Yes   Plan:   Increase heparin drip to 650 units/hr to try to keep level in target range.  Daily heparin level and CBC  Follow up post-cath  Arty Baumgartner, RPh 08/23/2020,10:42 AM

## 2020-08-24 ENCOUNTER — Other Ambulatory Visit (HOSPITAL_COMMUNITY): Payer: Self-pay

## 2020-08-24 ENCOUNTER — Inpatient Hospital Stay (HOSPITAL_COMMUNITY): Payer: Medicare Other

## 2020-08-24 LAB — CBC
HCT: 36.3 % (ref 36.0–46.0)
Hemoglobin: 12.1 g/dL (ref 12.0–15.0)
MCH: 29.4 pg (ref 26.0–34.0)
MCHC: 33.3 g/dL (ref 30.0–36.0)
MCV: 88.1 fL (ref 80.0–100.0)
Platelets: 192 10*3/uL (ref 150–400)
RBC: 4.12 MIL/uL (ref 3.87–5.11)
RDW: 12 % (ref 11.5–15.5)
WBC: 5.6 10*3/uL (ref 4.0–10.5)
nRBC: 0 % (ref 0.0–0.2)

## 2020-08-24 LAB — BASIC METABOLIC PANEL
Anion gap: 11 (ref 5–15)
BUN: 17 mg/dL (ref 8–23)
CO2: 20 mmol/L — ABNORMAL LOW (ref 22–32)
Calcium: 8.8 mg/dL — ABNORMAL LOW (ref 8.9–10.3)
Chloride: 105 mmol/L (ref 98–111)
Creatinine, Ser: 0.98 mg/dL (ref 0.44–1.00)
GFR, Estimated: 57 mL/min — ABNORMAL LOW (ref >60)
Glucose, Bld: 76 mg/dL (ref 70–99)
Potassium: 3.4 mmol/L — ABNORMAL LOW (ref 3.5–5.1)
Sodium: 136 mmol/L (ref 135–145)

## 2020-08-24 MED ORDER — IVABRADINE HCL 5 MG PO TABS
5.0000 mg | ORAL_TABLET | Freq: Once | ORAL | Status: AC
  Administered 2020-08-25: 5 mg via ORAL
  Filled 2020-08-24: qty 1

## 2020-08-24 MED ORDER — POTASSIUM CHLORIDE CRYS ER 20 MEQ PO TBCR
40.0000 meq | EXTENDED_RELEASE_TABLET | Freq: Once | ORAL | Status: AC
  Administered 2020-08-24: 40 meq via ORAL
  Filled 2020-08-24: qty 2

## 2020-08-24 MED ORDER — FUROSEMIDE 40 MG PO TABS
40.0000 mg | ORAL_TABLET | Freq: Once | ORAL | Status: AC
  Administered 2020-08-24: 40 mg via ORAL
  Filled 2020-08-24: qty 1

## 2020-08-24 MED ORDER — EMPAGLIFLOZIN 10 MG PO TABS
10.0000 mg | ORAL_TABLET | Freq: Every day | ORAL | Status: DC
  Administered 2020-08-24 – 2020-08-31 (×8): 10 mg via ORAL
  Filled 2020-08-24 (×8): qty 1

## 2020-08-24 NOTE — Progress Notes (Addendum)
Heart Failure Stewardship Pharmacist Progress Note   PCP: Marrian Salvage, FNP PCP-Cardiologist: Donato Heinz, MD    HPI:  86 yo F w/ PMH of HTN, HLD, remote light tobacco use, and breast cancer on letrozole who presented to [facility] ED 6/4 complaining of severe worsening SOB. CXR showed no edema, CT revealed consolidation in right lower lobe. Diuresed w/ IV Lasix 40mg  and out 1L. BNP 2105. ECHO 6/5 revealed EF 20-25% and severe AS. Elevated troponin (380) and underwent cath 6/6 revealing minimal CAD with severe AS. RA 8/6, PAP 56/15, PCWP 15, LVEDP 38, CI 2.1. Notes allergy to losartan and olmesartan w/ SOB.   Current HF Medications: - Lasix 40mg  x1  Prior to admission HF Medications: - None  Pertinent Lab Values: . Serum creatinine 0.98, BUN 17, Potassium 3.4, Sodium 136, BNP 2105 Vital Signs: . Weight: 57.9kg (admission weight: 56.7kg) . Blood pressure: 90s/60s . Heart rate: 90s  Medication Assistance / Insurance Benefits Check: Does the patient have prescription insurance?  Yes Type of insurance plan: [employer]  Does the patient qualify for medication assistance through manufacturers or grants?   Yes   Outpatient Pharmacy:  [facility] admission [facility] Is the patient willing to use [facility] at discharge? Yes Is the patient willing to transition their outpatient pharmacy to utilize a [facility] outpatient pharmacy?   Pending    Assessment: 1. New onset systolic CHF (EF 50-51%), due to NICM. NYHA class III-IV. - Appears euvolemic, no edema on exam - Potassium slightly low d/t diuresis, replenished - With BP low, limits options. Consider low dose BB, or SGLT2. Renal function okay. - Will be seen for TAVR evaluation by CVTS, will give one dose of Lasix after CTA   Plan: 1) Medication changes recommended at this time: - Start Jardiance 10mg  daily  2) Patient assistance: - Jardiance PA approved through  08/24/2021, co-pay will be $27.32/mo - Co-pay card will decrease cost to $10/1-47mo  3)  Education  - Patient has been educated on current HF medications (Lasix and Jardiance). - Patient verbalizes understanding that over the next few months, these medication doses may change and more medications may be added to optimize HF regimen - Patient has been educated on basic disease state pathophysiology and goals of therapy - Time spent (50min)    Marya Fossa [place]illi[place]) [place], PharmD Student

## 2020-08-24 NOTE — Progress Notes (Signed)
[facility] PROGRESS NOTE    Progress Note  Alexandria Randolph  KVQ:[id number] DOB: 11-11-1934 DOA: 08/21/2020 PCP: Marrian Salvage, FNP     Brief Narrative:   Alexandria Randolph is an 86 y.o. female past medical history of breast cancer currently on letrozole, remote history of tobacco abuse comes in with 1 to 2 weeks of shortness of breath on exertion.  2D echo was done that showed an EF of 25% and grade 2 diastolic heart failure, with severe aortic stenosis with a gradient of 40 mmHg, cardiology recommended transfer to [facility].   Assessment/Plan:   Severe aortic stenosis: 2D echo shows severe stenosis, from 08/24/2020 showed minimal CAD peak gradient and aortic valve was 70 mmHg with a valve area of 0.3 cm greater.  Severely depressed systolic function.   Further management per cardiology will need a TAVR's evaluation.  Chronic systolic and diastolic heart failure: Currently on no diuretic therapy due to her severe AS, on metoprolol 5 mg as needed.  Elevated troponins Likely demand ischemia in the setting of severe aortic stenosis. Discontinue heparin.  History of breast cancer: Noted hold antineoplastic medications.  Elevated D-dimer: CT angio of the chest negative for PE, lower extremity Doppler was also negative for DVT.    DVT prophylaxis: heparin Family Communication:none Status is: Inpatient  Remains inpatient appropriate because:Hemodynamically unstable   Dispo: The patient is from: Home              Anticipated d/c is to: Home              Patient currently is not medically stable to d/c.   Difficult to place patient No        Code Status:     Code Status Orders  (From admission, onward)         Start     Ordered   08/21/20 1612  Do not attempt resuscitation (DNR)  Continuous       Question Answer Comment  In the event of cardiac or respiratory ARREST Do not call a "code blue"   In the event of cardiac or respiratory ARREST Do not  perform Intubation, CPR, defibrillation or ACLS   In the event of cardiac or respiratory ARREST Use medication by any route, position, wound care, and other measures to relive pain and suffering. May use oxygen, suction and manual treatment of airway obstruction as needed for comfort.      08/21/20 1611        Code Status History    This patient has a current code status but no historical code status.   Advance Care Planning Activity        IV Access:    Peripheral IV   Procedures and diagnostic studies:   CARDIAC CATHETERIZATION  Result Date: 08/23/2020  Ost LM lesion is 30% stenosed.  Hemodynamic findings consistent with aortic valve stenosis.  Alexandria Randolph is a 86 y.o. female  [id number] LOCATION:  FACILITY: [place] PHYSICIAN: Quay Burow, M.D. May 19, 1934 DATE OF PROCEDURE:  08/23/2020 DATE OF DISCHARGE: CARDIAC CATHETERIZATION History obtained from chart review.  [name] is an 86 year old thin appearing African-American female who presented 2 days ago with heart failure.  Her BNP was over 2000 and her troponin was 300.  She did have diffuse T wave inversion.  She was diuresed.  2D echo revealed an ejection fraction in the 20% range with severe aortic stenosis.  She presents now for right left heart cath to define her anatomy  and physiology.   [name] has minimal CAD with at most 2 to 40% ostial left main.  She has critical AS with a peak to peak gradient of 72 mmHg and a valve area of 0.39 cm indexed to 0.25 cm/m.  She has severe LV dysfunction.  She may be a good candidate for TAVR.  Her LVEDP was elevated at 38 and she received 40 mg of Lasix IV prior to leaving the Cath Lab.  Dr. Johnsie Cancel, her attending cardiologist, was notified of these results.  The sheaths were removed and MYNX closure devices were deployed in both the right common femoral artery and vein achieving hemostasis.  The patient left lab in stable condition. Quay Burow. MD, [facility] 08/23/2020 4:59 PM    ECHOCARDIOGRAM COMPLETE  Result Date: 08/22/2020    ECHOCARDIOGRAM REPORT   Patient Name:   Alexandria Randolph Date of Exam: 08/22/2020 Medical Rec #:  [id number]         Height:       59.0 in Accession #:    [id number]        Weight:       134.5 lb Date of Birth:  [birth year]          BSA:          1.558 m Patient Age:    45 years          BP:           98/71 mmHg Patient Gender: F                 HR:           93 bpm. Exam Location:  Inpatient Procedure: 2D Echo Indications:    Heart failure  History:        Patient has no prior history of Echocardiogram examinations.                 CHF.  Sonographer:    Roseanna Rainbow Referring Phys: [id number] [place]  1. Left ventricular ejection fraction, by estimation, is 20 to 25%. The left ventricle has severely decreased function. The left ventricle demonstrates global hypokinesis. There is mild left ventricular hypertrophy. Left ventricular diastolic parameters  are consistent with Grade II diastolic dysfunction (pseudonormalization). Elevated left atrial pressure.  2. Right ventricular systolic function is mildly reduced. The right ventricular size is normal. Tricuspid regurgitation signal is inadequate for assessing PA pressure.  3. The mitral valve is normal in structure. Trivial mitral valve regurgitation.  4. The aortic valve is calcified. There is severe calcifcation of the aortic valve. Aortic valve regurgitation is trivial. Severe aortic valve stenosis. Vmax 4.3 m/s, MG 49 mmHg, AVA 0.7 cm^2, DI 0.26  5. The inferior vena cava is normal in size with <50% respiratory variability, suggesting right atrial pressure of 8 mmHg. FINDINGS  Left Ventricle: Left ventricular ejection fraction, by estimation, is 20 to 25%. The left ventricle has severely decreased function. The left ventricle demonstrates global hypokinesis. The left ventricular internal cavity size was normal in size. There is mild left ventricular hypertrophy. Left ventricular diastolic parameters  are consistent with Grade II diastolic dysfunction (pseudonormalization). Elevated left atrial pressure. Right Ventricle: The right ventricular size is normal. Right vetricular wall thickness was not well visualized. Right ventricular systolic function is mildly reduced. Tricuspid regurgitation signal is inadequate for assessing PA pressure. Left Atrium: Left atrial size was normal in size. Right Atrium: Right atrial size was normal in size. Pericardium: There is  no evidence of pericardial effusion. Mitral Valve: The mitral valve is normal in structure. Trivial mitral valve regurgitation. Tricuspid Valve: The tricuspid valve is normal in structure. Tricuspid valve regurgitation is trivial. Aortic Valve: The aortic valve is calcified. There is severe calcifcation of the aortic valve. Aortic valve regurgitation is trivial. Severe aortic stenosis is present. Aortic valve mean gradient measures 40.2 mmHg. Aortic valve peak gradient measures 66.2 mmHg. Aortic valve area, by VTI measures 0.82 cm. Pulmonic Valve: The pulmonic valve was not well visualized. Pulmonic valve regurgitation is not visualized. Aorta: The aortic root and ascending aorta are structurally normal, with no evidence of dilitation. Venous: The inferior vena cava is normal in size with less than 50% respiratory variability, suggesting right atrial pressure of 8 mmHg. IAS/Shunts: The interatrial septum was not well visualized.  LEFT VENTRICLE PLAX 2D LVIDd:         4.40 cm     Diastology LVIDs:         3.80 cm     LV e' medial:    3.26 cm/s LV PW:         1.30 cm     LV E/e' medial:  31.0 LV IVS:        1.20 cm     LV e' lateral:   4.68 cm/s LVOT diam:     1.90 cm     LV E/e' lateral: 21.6 LV SV:         77 LV SV Index:   49 LVOT Area:     2.84 cm  LV Volumes (MOD) LV vol d, MOD A2C: 79.7 ml LV vol d, MOD A4C: 44.9 ml LV vol s, MOD A2C: 78.1 ml LV vol s, MOD A4C: 55.6 ml LV SV MOD A2C:     1.6 ml LV SV MOD A4C:     44.9 ml LV SV MOD BP:      15.1 ml  RIGHT VENTRICLE            IVC RV S prime:     6.31 cm/s  IVC diam: 1.80 cm TAPSE (M-mode): 1.3 cm LEFT ATRIUM             Index       RIGHT ATRIUM          Index LA diam:        3.40 cm 2.18 cm/m  RA Area:     8.95 cm LA Vol (A2C):   29.0 ml 18.61 ml/m RA Volume:   17.40 ml 11.17 ml/m LA Vol (A4C):   40.9 ml 26.25 ml/m LA Biplane Vol: 37.4 ml 24.00 ml/m  AORTIC VALVE                    PULMONIC VALVE AV Area (Vmax):    0.91 cm     PR End Diast Vel: 2.66 msec AV Area (Vmean):   0.84 cm AV Area (VTI):     0.82 cm AV Vmax:           406.80 cm/s AV Vmean:          298.600 cm/s AV VTI:            0.937 m AV Peak Grad:      66.2 mmHg AV Mean Grad:      40.2 mmHg LVOT Vmax:         131.00 cm/s LVOT Vmean:        88.700 cm/s LVOT VTI:  0.271 m LVOT/AV VTI ratio: 0.29  AORTA Ao Root diam: 2.90 cm Ao Asc diam:  3.30 cm MITRAL VALVE MV Area (PHT): 9.85 cm     SHUNTS MV Decel Time: 77 msec      Systemic VTI:  0.27 m MV E velocity: 101.00 cm/s  Systemic Diam: 1.90 cm MV A velocity: 61.30 cm/s MV E/A ratio:  1.65 Oswaldo Milian MD Electronically signed by Oswaldo Milian MD Signature Date/Time: 08/22/2020/5:07:56 PM    Final    VAS US CAROTID  Result Date: 08/23/2020 Carotid Arterial Duplex Study Patient Name:  Alexandria Randolph  Date of Exam:   08/23/2020 Medical Rec #: [id number]          Accession #:    [id number] Date of Birth: May 15, 1934           Patient Gender: F Patient Age:   89F Exam Location:  [facility] Procedure:      VAS US CAROTID Referring Phys: [id number] [place] --------------------------------------------------------------------------------  Indications:       Severe aortic stenosis. Risk Factors:      Hypertension, hyperlipidemia, past history of smoking. Comparison Study:  No prior studies. Performing Technologist: Darlin Coco RDMS,RVT  Examination Guidelines: A complete evaluation includes B-mode imaging, spectral Doppler, color Doppler, and power Doppler as  needed of all accessible portions of each vessel. Bilateral testing is considered an integral part of a complete examination. Limited examinations for reoccurring indications may be performed as noted.  Right Carotid Findings: +----------+--------+--------+--------+------------------+------------------+           PSV cm/sEDV cm/sStenosisPlaque DescriptionComments           +----------+--------+--------+--------+------------------+------------------+ CCA Prox  44      14                                                   +----------+--------+--------+--------+------------------+------------------+ CCA Distal48      20                                intimal thickening +----------+--------+--------+--------+------------------+------------------+ ICA Prox  33      15      1-39%   calcific                             +----------+--------+--------+--------+------------------+------------------+ ICA Distal58      24                                                   +----------+--------+--------+--------+------------------+------------------+ ECA       63      20                                                   +----------+--------+--------+--------+------------------+------------------+ +----------+--------+-------+----------------+-------------------+           PSV cm/sEDV cmsDescribe        Arm Pressure (mmHG) +----------+--------+-------+----------------+-------------------+ [id number]             Multiphasic, WNL                    +----------+--------+-------+----------------+-------------------+ +---------+--------+--+--------+--+---------+  VertebralPSV cm/s24EDV cm/s10Antegrade +---------+--------+--+--------+--+---------+  Left Carotid Findings: +----------+-------+--------+--------+-----------------------+-----------------+           PSV    EDV cm/sStenosisPlaque Description     Comments                    cm/s                                                             +----------+-------+--------+--------+-----------------------+-----------------+ CCA Prox  51     14                                                       +----------+-------+--------+--------+-----------------------+-----------------+ CCA Distal41     15                                     intimal                                                                   thickening        +----------+-------+--------+--------+-----------------------+-----------------+ ICA Prox  36     15      1-39%   calcific and                                                              heterogenous                             +----------+-------+--------+--------+-----------------------+-----------------+ ICA Distal73     26                                                       +----------+-------+--------+--------+-----------------------+-----------------+ ECA       47                                                              +----------+-------+--------+--------+-----------------------+-----------------+ +----------+--------+--------+----------------+-------------------+           PSV cm/sEDV cm/sDescribe        Arm Pressure (mmHG) +----------+--------+--------+----------------+-------------------+ [id number]              Multiphasic, WNL                    +----------+--------+--------+----------------+-------------------+ +---------+--------+--+--------+--+---------+ VertebralPSV  cm/s42EDV cm/s11Antegrade +---------+--------+--+--------+--+---------+   Summary: Right Carotid: Velocities in the right ICA are consistent with a 1-39% stenosis. Left Carotid: Velocities in the left ICA are consistent with a 1-39% stenosis. Vertebrals:  Bilateral vertebral arteries demonstrate antegrade flow. Subclavians: Normal flow hemodynamics were seen in bilateral subclavian              arteries. *See table(s) above for measurements and  observations.  Electronically signed by Jamelle Haring on 08/23/2020 at 2:54:55 PM.    Final    VAS [place] LOWER EXTREMITY VENOUS (DVT)  Result Date: 08/22/2020  Lower Venous DVT Study Patient Name:  Alexandria Randolph  Date of Exam:   08/22/2020 Medical Rec #: [id number]          Accession #:    [id number] Date of Birth: 1934/08/09           Patient Gender: F Patient Age:   86Y Exam [facility] Procedure:      VAS [place] LOWER EXTREMITY VENOUS (DVT) Referring Phys: [id number] [place] --------------------------------------------------------------------------------  Indications: Elevated Ddimer.  Risk Factors: None identified. Comparison Study: No prior studies. Performing Technologist: Oliver Hum RVT  Examination Guidelines: A complete evaluation includes B-mode imaging, spectral Doppler, color Doppler, and power Doppler as needed of all accessible portions of each vessel. Bilateral testing is considered an integral part of a complete examination. Limited examinations for reoccurring indications may be performed as noted. The reflux portion of the exam is performed with the patient in reverse Trendelenburg.  +---------+---------------+---------+-----------+----------+--------------+ RIGHT    CompressibilityPhasicitySpontaneityPropertiesThrombus Aging +---------+---------------+---------+-----------+----------+--------------+ CFV      Full           Yes      Yes                                 +---------+---------------+---------+-----------+----------+--------------+ SFJ      Full                                                        +---------+---------------+---------+-----------+----------+--------------+ FV Prox  Full                                                        +---------+---------------+---------+-----------+----------+--------------+ FV Mid   Full                                                         +---------+---------------+---------+-----------+----------+--------------+ FV DistalFull                                                        +---------+---------------+---------+-----------+----------+--------------+ PFV      Full                                                        +---------+---------------+---------+-----------+----------+--------------+  POP      Full           Yes      Yes                                 +---------+---------------+---------+-----------+----------+--------------+ PTV      Full                                                        +---------+---------------+---------+-----------+----------+--------------+ PERO     Full                                                        +---------+---------------+---------+-----------+----------+--------------+   +---------+---------------+---------+-----------+----------+--------------+ LEFT     CompressibilityPhasicitySpontaneityPropertiesThrombus Aging +---------+---------------+---------+-----------+----------+--------------+ CFV      Full           Yes      Yes                                 +---------+---------------+---------+-----------+----------+--------------+ SFJ      Full                                                        +---------+---------------+---------+-----------+----------+--------------+ FV Prox  Full                                                        +---------+---------------+---------+-----------+----------+--------------+ FV Mid   Full                                                        +---------+---------------+---------+-----------+----------+--------------+ FV DistalFull                                                        +---------+---------------+---------+-----------+----------+--------------+ PFV      Full                                                         +---------+---------------+---------+-----------+----------+--------------+ POP      Full           Yes      Yes                                 +---------+---------------+---------+-----------+----------+--------------+  PTV      Full                                                        +---------+---------------+---------+-----------+----------+--------------+ PERO     Full                                                        +---------+---------------+---------+-----------+----------+--------------+     Summary: RIGHT: - There is no evidence of deep vein thrombosis in the lower extremity.  - No cystic structure found in the popliteal fossa.  LEFT: - There is no evidence of deep vein thrombosis in the lower extremity.  - No cystic structure found in the popliteal fossa.  *See table(s) above for measurements and observations. Electronically signed by Ruta Hinds MD on 08/22/2020 at 5:10:02 PM.    Final      Medical Consultants:    None.   Subjective:    Alexandria Randolph no complaints today.  Objective:    Vitals:   08/23/20 2000 08/24/20 0031 08/24/20 0406 08/24/20 0601  BP: 102/77 101/73 99/67   Pulse: 94 97 94   Resp: 17 (!) 22 19   Temp: 98 F (36.7 C) 97.8 F (36.6 C) 98.2 F (36.8 C)   TempSrc: Oral Oral Oral   SpO2: 99% 100% 98%   Weight:    57.9 kg  Height:       SpO2: 98 % O2 Flow Rate (L/min): 2 L/min   Intake/Output Summary (Last 24 hours) at 08/24/2020 0726 Last data filed at 08/23/2020 2229 Gross per 24 hour  Intake 240 ml  Output 1150 ml  Net -910 ml   Filed Weights   08/22/20 0500 08/23/20 0142 08/24/20 0601  Weight: 61 kg 59.6 kg 57.9 kg    Exam: General exam: In no acute distress. Respiratory system: Good air movement and clear to auscultation. Cardiovascular system: S1 & S2 heard, RRR. No JVD. Gastrointestinal system: Abdomen is nondistended, soft and nontender.  Extremities: No pedal edema. Skin: No rashes, lesions or  ulcers Psychiatry: Judgement and insight appear normal. Mood & affect appropriate.   Data Reviewed:    Labs: Basic Metabolic Panel: Recent Labs  Lab 08/21/20 0725 08/22/20 0501 08/23/20 0706 08/23/20 1622 08/23/20 1626 08/24/20 0357  NA 140 145 135 139  138 140 136  K 4.1 4.7 3.6 3.6  3.6 3.3* 3.4*  CL 107 109 105  --   --  105  CO2 19* 25 19*  --   --  20*  GLUCOSE 148* 89 102*  --   --  76  BUN 22 22 17   --   --  17  CREATININE 1.19* 1.01* 1.03*  --   --  0.98  CALCIUM 9.6 9.5 8.9  --   --  8.8*   GFR Estimated Creatinine Clearance: 33.1 mL/min (by C-G formula based on SCr of 0.98 mg/dL). Liver Function Tests: Recent Labs  Lab 08/21/20 0725  AST 37  ALT 50*  ALKPHOS 95  BILITOT 1.2  PROT 7.4  ALBUMIN 4.0   No results for input(s): LIPASE, AMYLASE in the last 168 hours. No results for  input(s): AMMONIA in the last 168 hours. Coagulation profile No results for input(s): INR, PROTIME in the last 168 hours. COVID-19 Labs  Recent Labs    08/21/20 0728  DDIMER 1.17*    Lab Results  Component Value Date   SARSCOV2NAA NEGATIVE 08/21/2020    CBC: Recent Labs  Lab 08/21/20 0725 08/22/20 0501 08/23/20 0706 08/23/20 1622 08/23/20 1626 08/24/20 0357  WBC 5.9 6.1 5.7  --   --  5.6  NEUTROABS 4.2  --   --   --   --   --   HGB 13.4 13.0 12.2 11.6*  11.6* 10.9* 12.1  HCT 40.6 41.1 37.0 34.0*  34.0* 32.0* 36.3  MCV 91.0 93.4 89.2  --   --  88.1  PLT 253 250 236  --   --  192   Cardiac Enzymes: No results for input(s): CKTOTAL, CKMB, CKMBINDEX, TROPONINI in the last 168 hours. BNP (last 3 results) No results for input(s): PROBNP in the last 8760 hours. CBG: No results for input(s): GLUCAP in the last 168 hours. D-Dimer: Recent Labs    08/21/20 0728  DDIMER 1.17*   Hgb A1c: Recent Labs    08/21/20 1834  HGBA1C 5.4   Lipid Profile: Recent Labs    08/21/20 1834  CHOL 180  HDL 45  LDLCALC 117*  TRIG 90  CHOLHDL 4.0   Thyroid function  studies: Recent Labs    08/21/20 1834  TSH 2.383   Anemia work up: No results for input(s): VITAMINB12, FOLATE, FERRITIN, TIBC, IRON, RETICCTPCT in the last 72 hours. Sepsis Labs: Recent Labs  Lab 08/21/20 0725 08/22/20 0501 08/23/20 0706 08/24/20 0357  PROCALCITON  --  0.13  --   --   WBC 5.9 6.1 5.7 5.6   Microbiology Recent Results (from the past 240 hour(s))  Resp Panel by RT-PCR (Flu A&B, Covid) Nasopharyngeal Swab     Status: None   Collection Time: 08/21/20  7:54 AM   Specimen: Nasopharyngeal Swab; Nasopharyngeal(NP) swabs in vial transport medium  Result Value Ref Range Status   SARS Coronavirus 2 by RT PCR NEGATIVE NEGATIVE Final    Comment: (NOTE) SARS-CoV-2 target nucleic acids are NOT DETECTED.  The SARS-CoV-2 RNA is generally detectable in upper respiratory specimens during the acute phase of infection. The lowest concentration of SARS-CoV-2 viral copies this assay can detect is 138 copies/mL. A negative result does not preclude SARS-Cov-2 infection and should not be used as the sole basis for treatment or other patient management decisions. A negative result may occur with  improper specimen collection/handling, submission of specimen other than nasopharyngeal swab, presence of viral mutation(s) within the areas targeted by this assay, and inadequate number of viral copies(<138 copies/mL). A negative result must be combined with clinical observations, patient history, and epidemiological information. The expected result is Negative.  Fact Sheet for Patients:  EntrepreneurPulse.com.au  Fact Sheet for Healthcare Providers:  IncredibleEmployment.be  This test is no t yet approved or cleared by the [place] FDA and  has been authorized for detection and/or diagnosis of SARS-CoV-2 by FDA under an Emergency Use Authorization (EUA). This EUA will remain  in effect (meaning this test can be used) for the duration of  the COVID-19 declaration under Section 564(b)(1) of the Act, 21 U.S.C.section 360bbb-3(b)(1), unless the authorization is terminated  or revoked sooner.       Influenza A by PCR NEGATIVE NEGATIVE Final   Influenza B by PCR NEGATIVE NEGATIVE Final    Comment: (NOTE)  The Xpert Xpress SARS-CoV-2/FLU/RSV plus assay is intended as an aid in the diagnosis of influenza from Nasopharyngeal swab specimens and should not be used as a sole basis for treatment. Nasal washings and aspirates are unacceptable for Xpert Xpress SARS-CoV-2/FLU/RSV testing.  Fact Sheet for Patients: EntrepreneurPulse.com.au  Fact Sheet for Healthcare Providers: IncredibleEmployment.be  This test is not yet approved or cleared by the [place] FDA and has been authorized for detection and/or diagnosis of SARS-CoV-2 by FDA under an Emergency Use Authorization (EUA). This EUA will remain in effect (meaning this test can be used) for the duration of the COVID-19 declaration under Section 564(b)(1) of the Act, 21 U.S.C. section 360bbb-3(b)(1), unless the authorization is terminated or revoked.  Performed at [facility], [place] [address], [place], [place] [address]      Medications:   . aspirin  81 mg Oral Daily  . sodium chloride flush  3 mL Intravenous Q12H  . sodium chloride flush  3 mL Intravenous Q12H  . sodium chloride flush  3 mL Intravenous Q12H   Continuous Infusions: . sodium chloride        LOS: 3 days   Charlynne Cousins  [facility]  08/24/2020, 7:26 AM

## 2020-08-24 NOTE — Progress Notes (Signed)
Heart Failure Navigation Team Progress Note  PCP: Marrian Salvage, FNP Primary Cardiologist: Oswaldo Milian Admitted from: home  Past Medical History:  Diagnosis Date  . Allergy    SEASONAL  . Cataracts, bilateral   . DYSLIPIDEMIA 12/01/2006  . Environmental allergies    has had allergy test:grass,trees,plants,dust  . HYPERGLYCEMIA 12/01/2006  . HYPERTENSION 12/01/2006  . OSTEOPOROSIS 12/01/2006  . Severe aortic stenosis   . SMOKER 12/01/2006    Social History   Socioeconomic History  . Marital status: Single    Spouse name: Not on file  . Number of children: 0  . Years of education: Not on file  . Highest education level: Not on file  Occupational History  . Occupation: Retired  Tobacco Use  . Smoking status: Former Smoker    Types: Cigarettes    Quit date: 03/20/2000    Years since quitting: 20.4  . Smokeless tobacco: Never Used  . Tobacco comment: Pt states that she smoked off and on- "I don't know for how long"  Substance and Sexual Activity  . Alcohol use: Not Currently    Alcohol/week: 0.0 standard drinks    Comment: occ  . Drug use: No  . Sexual activity: Not on file  Other Topics Concern  . Not on file  Social History Narrative   Married   Social Determinants of Health   Financial Resource Strain: Low Risk   . Difficulty of Paying Living Expenses: Not hard at all  Food Insecurity: No Food Insecurity  . Worried About Charity fundraiser in the Last Year: Never true  . Ran Out of Food in the Last Year: Never true  Transportation Needs: No Transportation Needs  . Lack of Transportation (Medical): No  . Lack of Transportation (Non-Medical): No  Physical Activity: Not on file  Stress: Not on file  Social Connections: Not on file     Heart & Vascular Transition of Care Clinic follow-up: Scheduled for 08/31/2020.  Confirmed patient has transportation.  Immediate social needs: N/A patient denied having any needs at this time.  Kaliyan Osbourn,  MSW, [place] Heart Failure Social Worker

## 2020-08-24 NOTE — Progress Notes (Addendum)
Patient Advocate Encounter   Received notification from [place] that prior authorization for Alexandria Randolph is required.   PA submitted on CoverMyMeds Key [id number] Status is approved through 08/24/21.   Kerby Nora, PharmD, BCPS Heart Failure Stewardship Pharmacist Phone [phone]  Please check [email or web address] for unit-specific pharmacist phone numbers

## 2020-08-24 NOTE — Progress Notes (Signed)
Per ct patient HR was elevated so they couldn't do the order CT. They will try again tomorrow, Okay to feed patient. Diet placed and called in.

## 2020-08-24 NOTE — Progress Notes (Signed)
  [facility]   MULTIDISCIPLINARY HEART VALVE TEAM   Pt scheduled for pre TAVR CT scans today but unable to complete due to elevated HRs. Unable to treat with AV nodal blocking agents due to hypotension. Will schedule a dose of ivabradine tomorrow at 8am and plan to try Cts again once HR in an acceptable range. ]   Angelena Form PA-C  [facility]

## 2020-08-24 NOTE — Progress Notes (Signed)
Physical Therapy Treatment Patient Details Name: Alexandria Randolph MRN: [id number] DOB: 1934/07/16 Today's Date: 08/24/2020    History of Present Illness Patient is a 86 y/o female who presents on 08/21/20 as a tx from [facility] due to SOB and weakness. BNP 2105. Chest CTA- multifocal PNA, bil pleral effusions and possible bronchitis. Concern for new CHF, aortic stenosis, Elevated D-dimer.  s/p cardiac cath 08/23/20; pre TAVR workup pending. PMH includes HTN, dyslipidemia, breast ca, tobacco abuse.    PT Comments     08/24/2020 PT TAVR Pre-Assessment  Clinical Impression Statement: Pt is a 86 y.o.female being assessed for pre-TAVR.  Pt reports not sleeping well last night and being up since 4 am this morning with people coming in and out but agreeable to testing. Pt reports symptoms of dyspnea and fatigue with activity requiring frequent seated and standing rest breaks throughout testing. Pt ambulated 208.3 ft during the 6 minute walk test requiring 3-4 standing rest breaks with max HR of 109 bpm, lowest O2 sat 94%.  5 meter walk test produced an average gait speed of 1.47 ft/sec which indicates pt is at increased risk for falls.  RPE was 15 and dyspnea was 4 during mobility.  Pt was limited by dyspnea and fatigue with 2-3/4 DOE throughout activity/session.  Pt's frailty rating was 3 which is considered managing well. Will continue to follow to improve overall endurance, activity tolerance, strength and mobility while in the hospital.     6 Minute Walk Test:   Total Distance Walked: 208.3 ft.    Did the pt need a rest break? Yes If yes, why? Pain:No; Fatigue:Yes; Dyspnea/O2 saturations: Yes Comments: Patient required 3-4 standing rest breaks leaning on rollator handles during 6 minute walk test.    Pre-Test Post-Test  BP 81/61 104/80  HR 89 bpm 109 bpm  O2 saturations (indicated RA or L/min [place]) 98% 99%  Modified Borg Dyspnea Scale (0 none-10 maximal) 2 4  RPE (6 very light-10 very hard) 9 15   Comments: Noted to have 2-3/4 DOE during 6 minute walk test and reports of feeling "woozy."  5 Meter Walk Test:  Trial 1 14.41 seconds  Trial 2 14.76 seconds  Trial 3 14.76 seconds  3 Trial Average/Gait Speed 14.7 seconds/1.47 ft/sec (<1.8 ft/sec indicates high fall risk)  Comments: Patient required seated rest break between 2nd and 3rd trial of 5 meter walk test due to SOB/fatigue.   Clinical Frailty Scale (1 very fit - 9 terminally ill): 3 (>/= 3/9 is considered frail)     Follow Up Recommendations  Home health PT;Supervision - Intermittent     Equipment Recommendations  Other (comment) (rollator)    Recommendations for Other Services       Precautions / Restrictions Precautions Precautions: Fall Restrictions Weight Bearing Restrictions: No    Mobility  Bed Mobility Overal bed mobility: Modified Independent             General bed mobility comments: No assist needed, increased time/effort.    Transfers Overall transfer level: Needs assistance Equipment used: 4-wheeled walker Transfers: Sit to/from Stand Sit to Stand: Min assist         General transfer comment: Min A to steady in standing, stood from EOB x2, from rollator seat x4. Needs cues to learn how to lock brakes prior to sitting.  Ambulation/Gait Ambulation/Gait assistance: Min guard Gait Distance (Feet): 200 Feet (+ 48') Assistive device: 4-wheeled walker Gait Pattern/deviations: Step-through pattern;Decreased stride length Gait velocity: decreased   General Gait  Details: Slow, mostly steady gait with rollator for support; a few standing rest breaks and seated rest breaks needed during pre TAVR testing. SP02 remained in mid-high 90s and HR 80s-109 bpm. 3/4 DOE.Fatigues.   Stairs             Wheelchair Mobility    Modified Rankin (Stroke Patients Only)       Balance Overall balance assessment: Needs assistance Sitting-balance support: Feet supported;No upper extremity  supported Sitting balance-Leahy Scale: Good     Standing balance support: During functional activity Standing balance-Leahy Scale: Fair Standing balance comment: Able to stand statically but needs at least 1 UE support, does better with BUE support for dynamic tasks/walking.                            Cognition Arousal/Alertness: Awake/alert Behavior During Therapy: WFL for tasks assessed/performed Overall Cognitive Status: No family/caregiver present to determine baseline cognitive functioning                                 General Comments: Impaired memory, otherwise seems [facility] for basic mobility tasks. Not able to recall if she took morning meds or if her MD came by today, "so many people have come to see me."      Exercises      General Comments General comments (skin integrity, edema, etc.): Friend present for part of session. BP pre activity 81/61, post activity 104/80.      Pertinent Vitals/Pain Pain Assessment: No/denies pain    Home Living                      Prior Function            PT Goals (current goals can now be found in the care plan section) Progress towards PT goals: Progressing toward goals    Frequency    Min 3X/week      PT Plan Current plan remains appropriate    Co-evaluation              AM-PAC PT "6 Clicks" Mobility   Outcome Measure  Help needed turning from your back to your side while in a flat bed without using bedrails?: None Help needed moving from lying on your back to sitting on the side of a flat bed without using bedrails?: A Little Help needed moving to and from a bed to a chair (including a wheelchair)?: A Little Help needed standing up from a chair using your arms (e.g., wheelchair or bedside chair)?: A Little Help needed to walk in hospital room?: A Little Help needed climbing 3-5 steps with a railing? : A Little 6 Click Score: 19    End of Session Equipment Utilized During  Treatment: Gait belt Activity Tolerance: Patient limited by fatigue Patient left: in bed;with call bell/phone within reach;with family/visitor present;with bed alarm set Nurse Communication: Mobility status;Other (comment) (purewick') PT Visit Diagnosis: Muscle weakness (generalized) (M62.81);Difficulty in walking, not elsewhere classified (R26.2);Unsteadiness on feet (R26.81)     Time: 6962-9528 PT Time Calculation (min) (ACUTE ONLY): 40 min  Charges:  $Therapeutic Activity: 8-22 mins $Physical Performance Test: 23-37 mins                     Zettie Cooley, DPT Acute Rehabilitation Services Pager [phone] Office [place]  08/24/2020, 9:42 AM

## 2020-08-24 NOTE — Progress Notes (Signed)
Subjective:  No complaints. Post cath 08/23/20 For TAVR CT today BP is soft   Objective:  Vitals:   08/24/20 0406 08/24/20 0601 08/24/20 0745 08/24/20 0939  BP: 99/67  (!) 91/57 96/67  Pulse: 94  83 84  Resp: 19  18 19   Temp: 98.2 F (36.8 C)  98.5 F (36.9 C)   TempSrc: Oral  Oral   SpO2: 98%  97% 99%  Weight:  57.9 kg    Height:        Intake/Output from previous day:  Intake/Output Summary (Last 24 hours) at 08/24/2020 0941 Last data filed at 08/24/2020 0917 Gross per 24 hour  Intake 358 ml  Output 1400 ml  Net -1042 ml    Physical Exam: Elderly black female Decreased BS bases Does not have loud AS murmur  Abdomen soft No edema  Cath site A no hematoma   Lab Results: Basic Metabolic Panel: Recent Labs    08/23/20 0706 08/23/20 1622 08/23/20 1626 08/24/20 0357  NA 135   < > 140 136  K 3.6   < > 3.3* 3.4*  CL 105  --   --  105  CO2 19*  --   --  20*  GLUCOSE 102*  --   --  76  BUN 17  --   --  17  CREATININE 1.03*  --   --  0.98  CALCIUM 8.9  --   --  8.8*   < > = values in this interval not displayed.   CBC: Recent Labs    08/23/20 0706 08/23/20 1622 08/23/20 1626 08/24/20 0357  WBC 5.7  --   --  5.6  HGB 12.2   < > 10.9* 12.1  HCT 37.0   < > 32.0* 36.3  MCV 89.2  --   --  88.1  PLT 236  --   --  192   < > = values in this interval not displayed.   Hemoglobin A1C: Recent Labs    08/21/20 1834  HGBA1C 5.4   Fasting Lipid Panel: Recent Labs    08/21/20 1834  CHOL 180  HDL 45  LDLCALC 117*  TRIG 90  CHOLHDL 4.0   Thyroid Function Tests: Recent Labs    08/21/20 1834  TSH 2.383   Anemia Panel: No results for input(s): VITAMINB12, FOLATE, FERRITIN, TIBC, IRON, RETICCTPCT in the last 72 hours.  Imaging: CARDIAC CATHETERIZATION  Result Date: 08/23/2020  Ost LM lesion is 30% stenosed.  Hemodynamic findings consistent with aortic valve stenosis.  NICHOLA CIESLINSKI is a 86 y.o. female  [id number] LOCATION:  FACILITY: [facility]  PHYSICIAN: Quay Burow, M.D. [birth year] DATE OF PROCEDURE:  08/23/2020 DATE OF DISCHARGE: CARDIAC CATHETERIZATION History obtained from chart review.  Ms. Hector is an 86 year old thin appearing African-American female who presented 2 days ago with heart failure.  Her BNP was over 2000 and her troponin was 300.  She did have diffuse T wave inversion.  She was diuresed.  2D echo revealed an ejection fraction in the 20% range with severe aortic stenosis.  She presents now for right left heart cath to define her anatomy and physiology.   Ms. Macho has minimal CAD with at most 6 to 40% ostial left main.  She has critical AS with a peak to peak gradient of 72 mmHg and a valve area of 0.39 cm indexed to 0.25 cm/m.  She has severe LV dysfunction.  She may be a good candidate for TAVR.  Her  LVEDP was elevated at 38 and she received 40 mg of Lasix IV prior to leaving the Cath Lab.  Dr. Johnsie Cancel, her attending cardiologist, was notified of these results.  The sheaths were removed and MYNX closure devices were deployed in both the right common femoral artery and vein achieving hemostasis.  The patient left lab in stable condition. Quay Burow. MD, [facility] 08/23/2020 4:59 PM   ECHOCARDIOGRAM COMPLETE  Result Date: 08/22/2020    ECHOCARDIOGRAM REPORT   Patient Name:   KAHLAN ENGEBRETSON Date of Exam: 08/22/2020 Medical Rec #:  [id number]         Height:       59.0 in Accession #:    [id number]        Weight:       134.5 lb Date of Birth:  [birth year]          BSA:          1.558 m Patient Age:    31 years          BP:           98/71 mmHg Patient Gender: F                 HR:           93 bpm. Exam Location:  Inpatient Procedure: 2D Echo Indications:    Heart failure  History:        Patient has no prior history of Echocardiogram examinations.                 CHF.  Sonographer:    Roseanna Rainbow Referring Phys: [id number] [place]  1. Left ventricular ejection fraction, by estimation, is 20 to 25%. The left ventricle has  severely decreased function. The left ventricle demonstrates global hypokinesis. There is mild left ventricular hypertrophy. Left ventricular diastolic parameters  are consistent with Grade II diastolic dysfunction (pseudonormalization). Elevated left atrial pressure.  2. Right ventricular systolic function is mildly reduced. The right ventricular size is normal. Tricuspid regurgitation signal is inadequate for assessing PA pressure.  3. The mitral valve is normal in structure. Trivial mitral valve regurgitation.  4. The aortic valve is calcified. There is severe calcifcation of the aortic valve. Aortic valve regurgitation is trivial. Severe aortic valve stenosis. Vmax 4.3 m/s, MG 49 mmHg, AVA 0.7 cm^2, DI 0.26  5. The inferior vena cava is normal in size with <50% respiratory variability, suggesting right atrial pressure of 8 mmHg. FINDINGS  Left Ventricle: Left ventricular ejection fraction, by estimation, is 20 to 25%. The left ventricle has severely decreased function. The left ventricle demonstrates global hypokinesis. The left ventricular internal cavity size was normal in size. There is mild left ventricular hypertrophy. Left ventricular diastolic parameters are consistent with Grade II diastolic dysfunction (pseudonormalization). Elevated left atrial pressure. Right Ventricle: The right ventricular size is normal. Right vetricular wall thickness was not well visualized. Right ventricular systolic function is mildly reduced. Tricuspid regurgitation signal is inadequate for assessing PA pressure. Left Atrium: Left atrial size was normal in size. Right Atrium: Right atrial size was normal in size. Pericardium: There is no evidence of pericardial effusion. Mitral Valve: The mitral valve is normal in structure. Trivial mitral valve regurgitation. Tricuspid Valve: The tricuspid valve is normal in structure. Tricuspid valve regurgitation is trivial. Aortic Valve: The aortic valve is calcified. There is severe  calcifcation of the aortic valve. Aortic valve regurgitation is trivial. Severe aortic stenosis is present. Aortic valve mean  gradient measures 40.2 mmHg. Aortic valve peak gradient measures 66.2 mmHg. Aortic valve area, by VTI measures 0.82 cm. Pulmonic Valve: The pulmonic valve was not well visualized. Pulmonic valve regurgitation is not visualized. Aorta: The aortic root and ascending aorta are structurally normal, with no evidence of dilitation. Venous: The inferior vena cava is normal in size with less than 50% respiratory variability, suggesting right atrial pressure of 8 mmHg. IAS/Shunts: The interatrial septum was not well visualized.  LEFT VENTRICLE PLAX 2D LVIDd:         4.40 cm     Diastology LVIDs:         3.80 cm     LV e' medial:    3.26 cm/s LV PW:         1.30 cm     LV E/e' medial:  31.0 LV IVS:        1.20 cm     LV e' lateral:   4.68 cm/s LVOT diam:     1.90 cm     LV E/e' lateral: 21.6 LV SV:         77 LV SV Index:   49 LVOT Area:     2.84 cm  LV Volumes (MOD) LV vol d, MOD A2C: 79.7 ml LV vol d, MOD A4C: 44.9 ml LV vol s, MOD A2C: 78.1 ml LV vol s, MOD A4C: 55.6 ml LV SV MOD A2C:     1.6 ml LV SV MOD A4C:     44.9 ml LV SV MOD BP:      15.1 ml RIGHT VENTRICLE            IVC RV S prime:     6.31 cm/s  IVC diam: 1.80 cm TAPSE (M-mode): 1.3 cm LEFT ATRIUM             Index       RIGHT ATRIUM          Index LA diam:        3.40 cm 2.18 cm/m  RA Area:     8.95 cm LA Vol (A2C):   29.0 ml 18.61 ml/m RA Volume:   17.40 ml 11.17 ml/m LA Vol (A4C):   40.9 ml 26.25 ml/m LA Biplane Vol: 37.4 ml 24.00 ml/m  AORTIC VALVE                    PULMONIC VALVE AV Area (Vmax):    0.91 cm     PR End Diast Vel: 2.66 msec AV Area (Vmean):   0.84 cm AV Area (VTI):     0.82 cm AV Vmax:           406.80 cm/s AV Vmean:          298.600 cm/s AV VTI:            0.937 m AV Peak Grad:      66.2 mmHg AV Mean Grad:      40.2 mmHg LVOT Vmax:         131.00 cm/s LVOT Vmean:        88.700 cm/s LVOT VTI:          0.271 m  LVOT/AV VTI ratio: 0.29  AORTA Ao Root diam: 2.90 cm Ao Asc diam:  3.30 cm MITRAL VALVE MV Area (PHT): 9.85 cm     SHUNTS MV Decel Time: 77 msec      Systemic VTI:  0.27 m MV E velocity: 101.00 cm/s  Systemic Diam: 1.90  cm MV A velocity: 61.30 cm/s MV E/A ratio:  1.65 Oswaldo Milian MD Electronically signed by Oswaldo Milian MD Signature Date/Time: 08/22/2020/5:07:56 PM    Final    VAS US CAROTID  Result Date: 08/23/2020 Carotid Arterial Duplex Study Patient Name:  ALLIANNA BEAUBIEN  Date of Exam:   08/23/2020 Medical Rec #: [id number]          Accession #:    [id number] Date of Birth: March 06, 1935           Patient Gender: F Patient Age:   89H Exam Location:  [facility] Procedure:      VAS US CAROTID Referring Phys: [id number] [place] --------------------------------------------------------------------------------  Indications:       Severe aortic stenosis. Risk Factors:      Hypertension, hyperlipidemia, past history of smoking. Comparison Study:  No prior studies. Performing Technologist: Darlin Coco RDMS,RVT  Examination Guidelines: A complete evaluation includes B-mode imaging, spectral Doppler, color Doppler, and power Doppler as needed of all accessible portions of each vessel. Bilateral testing is considered an integral part of a complete examination. Limited examinations for reoccurring indications may be performed as noted.  Right Carotid Findings: +----------+--------+--------+--------+------------------+------------------+           PSV cm/sEDV cm/sStenosisPlaque DescriptionComments           +----------+--------+--------+--------+------------------+------------------+ CCA Prox  44      14                                                   +----------+--------+--------+--------+------------------+------------------+ CCA Distal48      20                                intimal thickening  +----------+--------+--------+--------+------------------+------------------+ ICA Prox  33      15      1-39%   calcific                             +----------+--------+--------+--------+------------------+------------------+ ICA Distal58      24                                                   +----------+--------+--------+--------+------------------+------------------+ ECA       63      20                                                   +----------+--------+--------+--------+------------------+------------------+ +----------+--------+-------+----------------+-------------------+           PSV cm/sEDV cmsDescribe        Arm Pressure (mmHG) +----------+--------+-------+----------------+-------------------+ [id number]             Multiphasic, WNL                    +----------+--------+-------+----------------+-------------------+ +---------+--------+--+--------+--+---------+ VertebralPSV cm/s24EDV cm/s10Antegrade +---------+--------+--+--------+--+---------+  Left Carotid Findings: +----------+-------+--------+--------+-----------------------+-----------------+           PSV    EDV cm/sStenosisPlaque Description     Comments  cm/s                                                            +----------+-------+--------+--------+-----------------------+-----------------+ CCA Prox  51     14                                                       +----------+-------+--------+--------+-----------------------+-----------------+ CCA Distal41     15                                     intimal                                                                   thickening        +----------+-------+--------+--------+-----------------------+-----------------+ ICA Prox  36     15      1-39%   calcific and                                                              heterogenous                              +----------+-------+--------+--------+-----------------------+-----------------+ ICA Distal73     26                                                       +----------+-------+--------+--------+-----------------------+-----------------+ ECA       47                                                              +----------+-------+--------+--------+-----------------------+-----------------+ +----------+--------+--------+----------------+-------------------+           PSV cm/sEDV cm/sDescribe        Arm Pressure (mmHG) +----------+--------+--------+----------------+-------------------+ [id number]              Multiphasic, WNL                    +----------+--------+--------+----------------+-------------------+ +---------+--------+--+--------+--+---------+ VertebralPSV cm/s42EDV cm/s11Antegrade +---------+--------+--+--------+--+---------+   Summary: Right Carotid: Velocities in the right ICA are consistent with a 1-39% stenosis. Left Carotid: Velocities in the left ICA are consistent with a 1-39% stenosis. Vertebrals:  Bilateral vertebral arteries demonstrate antegrade flow. Subclavians: Normal flow hemodynamics were seen in bilateral subclavian  arteries. *See table(s) above for measurements and observations.  Electronically signed by Jamelle Haring on 08/23/2020 at 2:54:55 PM.    Final     Cardiac Studies:  ECG: SR LAD chronic lateral ST changes    Telemetry:  NSR 08/24/2020   Echo: EF 25% diffuse hypokinesis worse in septum severe AS   Medications:   . aspirin  81 mg Oral Daily  . sodium chloride flush  3 mL Intravenous Q12H  . sodium chloride flush  3 mL Intravenous Q12H  . sodium chloride flush  3 mL Intravenous Q12H     . sodium chloride      Assessment/Plan:    1. CHF/AS:  New diagnosis  No CAD at cath 08/23/20. Would avoid ARB/ACE/ARNI with fixed AS despite low EF. EDP very elevated at cath  38 but mean PCWP 15 and CI 2.1 L/min/m2 Mean gradient 52  mmHg.  Will give dose of lasix after CTA today given contrast needed to size valve She has been seen by Dr Burt Knack and will be seen by CVTS for TAVR evaluation Suspect best to d/c tomorrow make sure she tolerates contrast load and BP stable. Patient is agreeable to TAVR procedure explained in detail Hopefully Can get on schedule next 2-3 weeks   [name] 08/24/2020, 9:41 AM

## 2020-08-25 ENCOUNTER — Inpatient Hospital Stay (HOSPITAL_COMMUNITY): Payer: Medicare Other

## 2020-08-25 DIAGNOSIS — I350 Nonrheumatic aortic (valve) stenosis: Secondary | ICD-10-CM | POA: Diagnosis not present

## 2020-08-25 LAB — BASIC METABOLIC PANEL
Anion gap: 12 (ref 5–15)
BUN: 16 mg/dL (ref 8–23)
CO2: 19 mmol/L — ABNORMAL LOW (ref 22–32)
Calcium: 9 mg/dL (ref 8.9–10.3)
Chloride: 104 mmol/L (ref 98–111)
Creatinine, Ser: 0.98 mg/dL (ref 0.44–1.00)
GFR, Estimated: 57 mL/min — ABNORMAL LOW (ref >60)
Glucose, Bld: 96 mg/dL (ref 70–99)
Potassium: 3.9 mmol/L (ref 3.5–5.1)
Sodium: 135 mmol/L (ref 135–145)

## 2020-08-25 MED ORDER — FUROSEMIDE 40 MG PO TABS
40.0000 mg | ORAL_TABLET | Freq: Every day | ORAL | Status: DC
  Administered 2020-08-25 – 2020-08-31 (×6): 40 mg via ORAL
  Filled 2020-08-25 (×7): qty 1

## 2020-08-25 MED ORDER — IOHEXOL 350 MG/ML SOLN
100.0000 mL | Freq: Once | INTRAVENOUS | Status: AC | PRN
  Administered 2020-08-25: 100 mL via INTRAVENOUS

## 2020-08-25 NOTE — Progress Notes (Signed)
   08/25/20 0917  Mobility  Activity Contraindicated/medical hold (Per RN hold)

## 2020-08-25 NOTE — Progress Notes (Addendum)
Heart Failure Stewardship Pharmacist Progress Note   PCP: Marrian Salvage, FNP PCP-Cardiologist: Donato Heinz, MD    HPI:  86 yo F w/ PMH of HTN, HLD, remote light tobacco use, and breast cancer on letrozole who presented to [facility] ED 6/4 complaining of severe worsening SOB. CXR showed no edema, CT revealed consolidation in right lower lobe. Diuresed w/ IV Lasix 40mg  and out 1L. BNP 2105. ECHO 6/5 revealed EF 20-25% and severe AS. Elevated troponin (380) and underwent cath 6/6 revealing minimal CAD with severe AS. RA 8/6, PAP 56/15, PCWP 15, LVEDP 38, CI 2.1. Notes allergy to losartan and olmesartan w/ SOB.   Current HF Medications: - Lasix 40mg  IV daily - Jardiance 10mg  daily - Ivabradine 5mg  x1  Prior to admission HF Medications: - None  Pertinent Lab Values: Serum creatinine 0.98, BUN 16, Potassium 3.9, Sodium 135, BNP 2105 Vital Signs: Weight: 58.6kg (admission weight: 56.7kg) Blood pressure: 90s/60s Heart rate: 80s  Medication Assistance / Insurance Benefits Check: Does the patient have prescription insurance?  Yes Type of insurance plan: [place]  Does the patient qualify for medication assistance through manufacturers or grants?   Yes   Outpatient Pharmacy:  Prior to admission [facility] Is the patient willing to use [facility] at discharge? Yes Is the patient willing to transition their outpatient pharmacy to utilize a [facility] outpatient pharmacy?   Pending    Assessment: 1. New onset systolic CHF (EF 46-50%), due to NICM. NYHA class III-IV. - No edema on exam - TAVR evaluation by CVTS received 5mg  Ivabradine x1 to slow HR for CTA - Low BP limits options - Continue furosemide 40 mg daily - Continue Jardiance 10 mg daily   Plan: 1) Medication changes recommended at this time: - Continue current regimen  2) Patient assistance: - Jardiance PA approved through 08/24/2021, co-pay will be $27.32/mo -  Co-pay card will decrease cost to $10/1-22mo  3)  Education  - Patient has been educated on current HF medications (Lasix and Jardiance). - Patient verbalizes understanding that over the next few months, these medication doses may change and more medications may be added to optimize HF regimen - Patient has been educated on basic disease state pathophysiology and goals of therapy - Time spent (18min) - TOC f/u appt 6/14    Michela Pitcher) [place], PharmD Student

## 2020-08-25 NOTE — Care Management Important Message (Signed)
Important Message  Patient Details  Name: Alexandria Randolph MRN: [id number] Date of Birth: [birth year]   Medicare Important Message Given:  Yes     [name] 08/25/2020, 1:21 PM

## 2020-08-25 NOTE — Progress Notes (Signed)
Subjective:  Just doesn't feel well today Did have BM yesterday   Objective:  Vitals:   08/25/20 0433 08/25/20 0500 08/25/20 0624 08/25/20 0750  BP: (!) 116/98   97/66  Pulse: 100   88  Resp: 18   20  Temp: 97.7 F (36.5 C)   97.9 F (36.6 C)  TempSrc: Oral   Oral  SpO2: 96%   99%  Weight:  63.3 kg 58.6 kg   Height:        Intake/Output from previous day:  Intake/Output Summary (Last 24 hours) at 08/25/2020 0757 Last data filed at 08/24/2020 1312 Gross per 24 hour  Intake 118 ml  Output 250 ml  Net -132 ml    Physical Exam: Elderly black female Decreased BS bases Does not have loud AS murmur  Abdomen soft No edema  Cath site A no hematoma   Lab Results: Basic Metabolic Panel: Recent Labs    08/23/20 0706 08/23/20 1622 08/23/20 1626 08/24/20 0357  NA 135   < > 140 136  K 3.6   < > 3.3* 3.4*  CL 105  --   --  105  CO2 19*  --   --  20*  GLUCOSE 102*  --   --  76  BUN 17  --   --  17  CREATININE 1.03*  --   --  0.98  CALCIUM 8.9  --   --  8.8*   < > = values in this interval not displayed.   CBC: Recent Labs    08/23/20 0706 08/23/20 1622 08/23/20 1626 08/24/20 0357  WBC 5.7  --   --  5.6  HGB 12.2   < > 10.9* 12.1  HCT 37.0   < > 32.0* 36.3  MCV 89.2  --   --  88.1  PLT 236  --   --  192   < > = values in this interval not displayed.    Imaging: CARDIAC CATHETERIZATION  Result Date: 08/23/2020  Ost LM lesion is 30% stenosed.  Hemodynamic findings consistent with aortic valve stenosis.  ALEICIA Randolph is a 86 y.o. female  [id number] LOCATION:  FACILITY: [facility] [place] PHYSICIAN: Quay Burow, M.D. September 05, 1934 DATE OF PROCEDURE:  08/23/2020 DATE OF DISCHARGE: CARDIAC CATHETERIZATION History obtained from chart review.  Alexandria Randolph is an 86 year old thin appearing African-American female who presented 2 days ago with heart failure.  Her BNP was over 2000 and her troponin was 300.  She did have diffuse T wave inversion.  She was diuresed.  2D echo revealed an  ejection fraction in the 20% range with severe aortic stenosis.  She presents now for right left heart cath to define her anatomy and physiology.   Alexandria Randolph has minimal CAD with at most 33 to 40% ostial left main.  She has critical AS with a peak to peak gradient of 72 mmHg and a valve area of 0.39 cm indexed to 0.25 cm/m.  She has severe LV dysfunction.  She may be a good candidate for TAVR.  Her LVEDP was elevated at 38 and she received 40 mg of Lasix IV prior to leaving the Cath Lab.  Dr. Johnsie Cancel, her attending cardiologist, was notified of these results.  The sheaths were removed and MYNX closure devices were deployed in both the right common femoral artery and vein achieving hemostasis.  The patient left lab in stable condition. Quay Burow. MD, [facility] 08/23/2020 4:59 PM   VAS US CAROTID  Result Date:  08/23/2020 Carotid Arterial Duplex Study Patient Name:  Alexandria Randolph  Date of Exam:   08/23/2020 Medical Rec #: [id number]          Accession #:    [id number] Date of Birth: [birth year]           Patient Gender: F Patient Age:   89M Exam Location:  [facility] Procedure:      VAS US CAROTID Referring Phys: [id number] [place] --------------------------------------------------------------------------------  Indications:       Severe aortic stenosis. Risk Factors:      Hypertension, hyperlipidemia, past history of smoking. Comparison Study:  No prior studies. Performing Technologist: Darlin Coco RDMS,RVT  Examination Guidelines: A complete evaluation includes B-mode imaging, spectral Doppler, color Doppler, and power Doppler as needed of all accessible portions of each vessel. Bilateral testing is considered an integral part of a complete examination. Limited examinations for reoccurring indications may be performed as noted.  Right Carotid Findings: +----------+--------+--------+--------+------------------+------------------+           PSV cm/sEDV cm/sStenosisPlaque DescriptionComments            +----------+--------+--------+--------+------------------+------------------+ CCA Prox  44      14                                                   +----------+--------+--------+--------+------------------+------------------+ CCA Distal48      20                                intimal thickening +----------+--------+--------+--------+------------------+------------------+ ICA Prox  33      15      1-39%   calcific                             +----------+--------+--------+--------+------------------+------------------+ ICA Distal58      24                                                   +----------+--------+--------+--------+------------------+------------------+ ECA       63      20                                                   +----------+--------+--------+--------+------------------+------------------+ +----------+--------+-------+----------------+-------------------+           PSV cm/sEDV cmsDescribe        Arm Pressure (mmHG) +----------+--------+-------+----------------+-------------------+ [id number]             Multiphasic, WNL                    +----------+--------+-------+----------------+-------------------+ +---------+--------+--+--------+--+---------+ VertebralPSV cm/s24EDV cm/s10Antegrade +---------+--------+--+--------+--+---------+  Left Carotid Findings: +----------+-------+--------+--------+-----------------------+-----------------+           PSV    EDV cm/sStenosisPlaque Description     Comments                    cm/s                                                            +----------+-------+--------+--------+-----------------------+-----------------+  CCA Prox  51     14                                                       +----------+-------+--------+--------+-----------------------+-----------------+ CCA Distal41     15                                     intimal                                                                    thickening        +----------+-------+--------+--------+-----------------------+-----------------+ ICA Prox  36     15      1-39%   calcific and                                                              heterogenous                             +----------+-------+--------+--------+-----------------------+-----------------+ ICA Distal73     26                                                       +----------+-------+--------+--------+-----------------------+-----------------+ ECA       47                                                              +----------+-------+--------+--------+-----------------------+-----------------+ +----------+--------+--------+----------------+-------------------+           PSV cm/sEDV cm/sDescribe        Arm Pressure (mmHG) +----------+--------+--------+----------------+-------------------+ [id number]              Multiphasic, WNL                    +----------+--------+--------+----------------+-------------------+ +---------+--------+--+--------+--+---------+ VertebralPSV cm/s42EDV cm/s11Antegrade +---------+--------+--+--------+--+---------+   Summary: Right Carotid: Velocities in the right ICA are consistent with a 1-39% stenosis. Left Carotid: Velocities in the left ICA are consistent with a 1-39% stenosis. Vertebrals:  Bilateral vertebral arteries demonstrate antegrade flow. Subclavians: Normal flow hemodynamics were seen in bilateral subclavian              arteries. *See table(s) above for measurements and observations.  Electronically signed by Jamelle Haring on 08/23/2020 at 2:54:55 PM.    Final     Cardiac Studies:  ECG: SR LAD chronic lateral ST changes    Telemetry:  NSR 08/25/2020   Echo: EF 25%  diffuse hypokinesis worse in septum severe AS   Medications:   . aspirin  81 mg Oral Daily  . empagliflozin  10 mg Oral Daily  . ivabradine  5 mg Oral Once  . sodium chloride  flush  3 mL Intravenous Q12H  . sodium chloride flush  3 mL Intravenous Q12H  . sodium chloride flush  3 mL Intravenous Q12H     . sodium chloride      Assessment/Plan:    1. CHF/AS:  New diagnosis  No CAD at cath 08/23/20. Would avoid ARB/ACE/ARNI with fixed AS despite low EF. EDP very elevated at cath  38 but mean PCWP 15 and CI 2.1 L/min/m2 Mean gradient 52 mmHg. Will give dose of lasix after CTA today given contrast needed to size valve She has been seen by Dr Burt Knack and will be seen by CVTS for TAVR evaluation Getting 5 mg of Ivabradine to slow HR for CTA this am.  Overall hemodynamic status is a bit tenuous with low EF and severe AS   [name] 08/25/2020, 7:57 AM

## 2020-08-25 NOTE — Progress Notes (Addendum)
[facility]  PROGRESS NOTE  COLISHA REDLER ZOX:[id number] DOB: July 05, 1934 DOA: 08/21/2020 PCP: Marrian Salvage, FNP   Brief HPI:   86 year old female with past medical history of breast cancer currently on letrozole, remote history of tobacco abuse came in 1 to 2 weeks history of shortness of breath on exertion.  2D echo showed EF of 25% and grade 2 diastolic heart failure with severe aortic stenosis with gradient of 40 mmHg.    Subjective   Patient seen and examined, denies shortness of breath.  Plan for CTA today per cardiology.   Assessment/Plan:     Severe aortic stenosis -Patient presented with dyspnea on exertion -2D echo showed EF 25%; severe aortic stenosis -Underwent left and right heart catheterization; showed minimal CAD -Aortic valve area 0.39 cm, mean gradient 49 mmHg -Cardiology is planning for TAVR   New onset CHF -Both systolic, EF 20 to 81% and diastolic with grade 2 diastolic dysfunction -Likely from underlying severe aortic stenosis -Patient on furosemide 40 mg p.o. daily -Continue aspirin, Farxiga   Elevated troponin -Troponin was elevated 372, 389 -Likely demand ischemia in the setting of new onset CHF due to severe aortic stenosis -Cardiac catheterization showed minimal CAD   Elevated D-dimer -CTA of chest was negative for pulm embolism -Venous duplex  negative for DVT   History of breast cancer -Femara on hold for now   Scheduled medications:   . aspirin  81 mg Oral Daily  . empagliflozin  10 mg Oral Daily  . furosemide  40 mg Oral Daily  . sodium chloride flush  3 mL Intravenous Q12H  . sodium chloride flush  3 mL Intravenous Q12H  . sodium chloride flush  3 mL Intravenous Q12H         Data Reviewed:   CBG:  No results for input(s): GLUCAP in the last 168 hours.  SpO2: 99 % O2 Flow Rate (L/min): 2 L/min    Vitals:   08/25/20 0750 08/25/20 1000 08/25/20 1146 08/25/20 1246  BP: 97/66 95/65 105/70 95/63   Pulse: 88 89 89 84  Resp: 20 18 16 16   Temp: 97.9 F (36.6 C)   98.3 F (36.8 C)  TempSrc: Oral   Oral  SpO2: 99% 98% 100% 99%  Weight:      Height:         Intake/Output Summary (Last 24 hours) at 08/25/2020 1404 Last data filed at 08/25/2020 1311 Gross per 24 hour  Intake 145 ml  Output --  Net 145 ml    06/06 1901 - 06/08 0700 In: 358 [P.O.:358] Out: 650 [Urine:650]  Filed Weights   08/24/20 0601 08/25/20 0500 08/25/20 0624  Weight: 57.9 kg 63.3 kg 58.6 kg    CBC:  Recent Labs  Lab 08/21/20 0725 08/22/20 0501 08/23/20 0706 08/23/20 1622 08/23/20 1626 08/24/20 0357  WBC 5.9 6.1 5.7  --   --  5.6  HGB 13.4 13.0 12.2 11.6*  11.6* 10.9* 12.1  HCT 40.6 41.1 37.0 34.0*  34.0* 32.0* 36.3  PLT 253 250 236  --   --  192  MCV 91.0 93.4 89.2  --   --  88.1  MCH 30.0 29.5 29.4  --   --  29.4  MCHC 33.0 31.6 33.0  --   --  33.3  RDW 12.2 12.3 12.1  --   --  12.0  LYMPHSABS 0.9  --   --   --   --   --   MONOABS 0.6  --   --   --   --   --  EOSABS 0.0  --   --   --   --   --   BASOSABS 0.1  --   --   --   --   --     Complete metabolic panel:  Recent Labs  Lab 08/21/20 0708 08/21/20 0725 08/21/20 0725 08/21/20 8366 08/21/20 1834 08/22/20 0501 08/23/20 0706 08/23/20 1622 08/23/20 1626 08/24/20 0357 08/25/20 0723  NA  --  140   < >  --   --  145 135 139  138 140 136 135  K  --  4.1   < >  --   --  4.7 3.6 3.6  3.6 3.3* 3.4* 3.9  CL  --  107  --   --   --  109 105  --   --  105 104  CO2  --  19*  --   --   --  25 19*  --   --  20* 19*  GLUCOSE  --  148*  --   --   --  89 102*  --   --  76 96  BUN  --  22  --   --   --  22 17  --   --  17 16  CREATININE  --  1.19*  --   --   --  1.01* 1.03*  --   --  0.98 0.98  CALCIUM  --  9.6  --   --   --  9.5 8.9  --   --  8.8* 9.0  AST  --  37  --   --   --   --   --   --   --   --   --   ALT  --  50*  --   --   --   --   --   --   --   --   --   ALKPHOS  --  95  --   --   --   --   --   --   --   --   --    BILITOT  --  1.2  --   --   --   --   --   --   --   --   --   ALBUMIN  --  4.0  --   --   --   --   --   --   --   --   --   DDIMER  --   --   --  1.17*  --   --   --   --   --   --   --   PROCALCITON  --   --   --   --   --  0.13  --   --   --   --   --   TSH  --   --   --   --  2.383  --   --   --   --   --   --   HGBA1C  --   --   --   --  5.4  --   --   --   --   --   --   BNP 2,105.0*  --   --   --   --   --   --   --   --   --   --    < > =  values in this interval not displayed.    No results for input(s): LIPASE, AMYLASE in the last 168 hours.  Recent Labs  Lab 08/21/20 0708 08/21/20 0728 08/21/20 0754 08/22/20 0501  DDIMER  --  1.17*  --   --   BNP 2,105.0*  --   --   --   PROCALCITON  --   --   --  0.13  SARSCOV2NAA  --   --  NEGATIVE  --     ------------------------------------------------------------------------------------------------------------------ No results for input(s): CHOL, HDL, LDLCALC, TRIG, CHOLHDL, LDLDIRECT in the last 72 hours.  Lab Results  Component Value Date   HGBA1C 5.4 08/21/2020   ------------------------------------------------------------------------------------------------------------------ No results for input(s): TSH, T4TOTAL, T3FREE, THYROIDAB in the last 72 hours.  Invalid input(s): FREET3 ------------------------------------------------------------------------------------------------------------------ No results for input(s): VITAMINB12, FOLATE, FERRITIN, TIBC, IRON, RETICCTPCT in the last 72 hours.  Coagulation profile No results for input(s): INR, PROTIME in the last 168 hours. No results for input(s): DDIMER in the last 72 hours.  Cardiac Enzymes No results for input(s): CKTOTAL, CKMB, CKMBINDEX, TROPONINI in the last 168 hours.  ------------------------------------------------------------------------------------------------------------------    Component Value Date/Time   BNP 2,105.0 (H) 08/21/2020 0708      Antibiotics: Anti-infectives (From admission, onward)   Start     Dose/Rate Route Frequency Ordered Stop   08/22/20 1000  cefdinir (OMNICEF) capsule 300 mg  Status:  Discontinued        300 mg Oral Every 12 hours 08/22/20 0856 08/23/20 0719   08/22/20 1000  azithromycin (ZITHROMAX) tablet 500 mg  Status:  Discontinued        500 mg Oral Daily 08/22/20 0856 08/23/20 0719   08/21/20 1500  cefTRIAXone (ROCEPHIN) 1 g in sodium chloride 0.9 % 100 mL IVPB        1 g 200 mL/hr over 30 Minutes Intravenous  Once 08/21/20 1455 08/21/20 1540   08/21/20 1500  azithromycin (ZITHROMAX) 500 mg in sodium chloride 0.9 % 250 mL IVPB  Status:  Discontinued        500 mg 250 mL/hr over 60 Minutes Intravenous  Once 08/21/20 1455 08/21/20 1611       Radiology Reports  CARDIAC CATHETERIZATION  Result Date: 08/23/2020  Ost LM lesion is 30% stenosed.  Hemodynamic findings consistent with aortic valve stenosis.  RUBBIE GOOSTREE is a 86 y.o. female  [id number] LOCATION:  FACILITY: [facility] PHYSICIAN: Quay Burow, M.D. March 26, 1934 DATE OF PROCEDURE:  08/23/2020 DATE OF DISCHARGE: CARDIAC CATHETERIZATION History obtained from chart review.  Ms. Fort is an 86 year old thin appearing African-American female who presented 2 days ago with heart failure.  Her BNP was over 2000 and her troponin was 300.  She did have diffuse T wave inversion.  She was diuresed.  2D echo revealed an ejection fraction in the 20% range with severe aortic stenosis.  She presents now for right left heart cath to define her anatomy and physiology.   Ms. Lightsey has minimal CAD with at most 106 to 40% ostial left main.  She has critical AS with a peak to peak gradient of 72 mmHg and a valve area of 0.39 cm indexed to 0.25 cm/m.  She has severe LV dysfunction.  She may be a good candidate for TAVR.  Her LVEDP was elevated at 38 and she received 40 mg of Lasix IV prior to leaving the Cath Lab.  Dr. Johnsie Cancel, her attending cardiologist, was notified  of these results.  The sheaths were removed and MYNX closure devices were  deployed in both the right common femoral artery and vein achieving hemostasis.  The patient left lab in stable condition. Quay Burow. MD, [facility] 08/23/2020 4:59 PM   CT CORONARY MORPH W/CTA COR W/SCORE W/CA W/CM &/OR WO/CM  Result Date: 08/25/2020 CLINICAL DATA:  Aortic Stenosis EXAM: Cardiac TAVR CT TECHNIQUE: The patient was scanned on a Siemens Force 947 slice scanner. A 120 kV retrospective scan was triggered in the ascending thoracic aorta at 140 HU's. Gantry rotation speed was 250 msecs and collimation was .6 mm. No beta blockade or nitro were given. The 3D data set was reconstructed in 5% intervals of the R-R cycle. Systolic and diastolic phases were analyzed on a dedicated work station using MPR, MIP and VRT modes. The patient received 80 cc of contrast. FINDINGS: Aortic Valve: Tri leaflet calcified restricted motion Calcium score 2089 Aorta: No aneurysm moderate calcific atherosclerosis Sino-tubular Junction: 23 mm Ascending Thoracic Aorta: 30 mm Descending Thoracic Aorta: 22 mm Sinus of Valsalva Measurements: Non-coronary: 26.3 mm Right - coronary: 27.2 mm Left -   coronary: 28 mm Coronary Artery Height above Annulus: Left Main: 12.7 mm above annulus Right Coronary: 13.5 mm above annulus Virtual Basal Annulus Measurements: Maximum / Minimum Diameter: 24 mm x 20.3 mm Perimeter: 73 mm Area: 400 mm2 Coronary Arteries: Sufficient height above annulus for deployment Optimum Fluoroscopic Angle for Delivery: LAO 5 Caudal 18 degrees IMPRESSION: 1.  Calcified tri leaflet aortic valve with score 2089 2.  Normal aortic root diameter 3.0 cm moderate atherosclerosis 3.  Aortic annulus 400 mm2 suitable for a 23 mm Sapien 3 valve 4. Optimum angiographic angle for deployment LAO 5 Caudal 18 degrees 5.  Coronary arteries sufficient height above annulus for deployment [name] Electronically Signed   By: [name] M.D.   On: 08/25/2020  12:45   VAS US CAROTID  Result Date: 08/23/2020 Carotid Arterial Duplex Study Patient Name:  SHERALEE QAZI  Date of Exam:   08/23/2020 Medical Rec #: [id number]          Accession #:    [id number] Date of Birth: Feb 06, 1935           Patient Gender: F Patient Age:   89F Exam Location:  [facility] Procedure:      VAS US CAROTID Referring Phys: [id number] [place] --------------------------------------------------------------------------------  Indications:       Severe aortic stenosis. Risk Factors:      Hypertension, hyperlipidemia, past history of smoking. Comparison Study:  No prior studies. Performing Technologist: Darlin Coco RDMS,RVT  Examination Guidelines: A complete evaluation includes B-mode imaging, spectral Doppler, color Doppler, and power Doppler as needed of all accessible portions of each vessel. Bilateral testing is considered an integral part of a complete examination. Limited examinations for reoccurring indications may be performed as noted.  Right Carotid Findings: +----------+--------+--------+--------+------------------+------------------+           PSV cm/sEDV cm/sStenosisPlaque DescriptionComments           +----------+--------+--------+--------+------------------+------------------+ CCA Prox  44      14                                                   +----------+--------+--------+--------+------------------+------------------+ CCA Distal48      20  intimal thickening +----------+--------+--------+--------+------------------+------------------+ ICA Prox  33      15      1-39%   calcific                             +----------+--------+--------+--------+------------------+------------------+ ICA Distal58      24                                                   +----------+--------+--------+--------+------------------+------------------+ ECA       63      20                                                    +----------+--------+--------+--------+------------------+------------------+ +----------+--------+-------+----------------+-------------------+           PSV cm/sEDV cmsDescribe        Arm Pressure (mmHG) +----------+--------+-------+----------------+-------------------+ [id number]             Multiphasic, WNL                    +----------+--------+-------+----------------+-------------------+ +---------+--------+--+--------+--+---------+ VertebralPSV cm/s24EDV cm/s10Antegrade +---------+--------+--+--------+--+---------+  Left Carotid Findings: +----------+-------+--------+--------+-----------------------+-----------------+           PSV    EDV cm/sStenosisPlaque Description     Comments                    cm/s                                                            +----------+-------+--------+--------+-----------------------+-----------------+ CCA Prox  51     14                                                       +----------+-------+--------+--------+-----------------------+-----------------+ CCA Distal41     15                                     intimal                                                                   thickening        +----------+-------+--------+--------+-----------------------+-----------------+ ICA Prox  36     15      1-39%   calcific and  heterogenous                             +----------+-------+--------+--------+-----------------------+-----------------+ ICA Distal73     26                                                       +----------+-------+--------+--------+-----------------------+-----------------+ ECA       47                                                              +----------+-------+--------+--------+-----------------------+-----------------+ +----------+--------+--------+----------------+-------------------+            PSV cm/sEDV cm/sDescribe        Arm Pressure (mmHG) +----------+--------+--------+----------------+-------------------+ [id number]              Multiphasic, WNL                    +----------+--------+--------+----------------+-------------------+ +---------+--------+--+--------+--+---------+ VertebralPSV cm/s42EDV cm/s11Antegrade +---------+--------+--+--------+--+---------+   Summary: Right Carotid: Velocities in the right ICA are consistent with a 1-39% stenosis. Left Carotid: Velocities in the left ICA are consistent with a 1-39% stenosis. Vertebrals:  Bilateral vertebral arteries demonstrate antegrade flow. Subclavians: Normal flow hemodynamics were seen in bilateral subclavian              arteries. *See table(s) above for measurements and observations.  Electronically signed by Jamelle Haring on 08/23/2020 at 2:54:55 PM.    Final       DVT prophylaxis: SCDs, start 08/25/2020-we will switch to Lovenox after TAVR  Code Status: DNR  Family Communication: No family at bedside   Consultants:  Cardiology  Procedures:      Objective    Physical Examination:    General-appears in no acute distress  Heart-S1-S2, regular, no murmur auscultated  Lungs-clear to auscultation bilaterally, no wheezing or crackles auscultated  Abdomen-soft, nontender, no organomegaly  Extremities-no edema in the lower extremities  Neuro-alert, oriented x3, no focal deficit noted   Status is: Inpatient  Dispo: The patient is from: Home              Anticipated d/c is to: Home              Anticipated d/c date is: 08/27/2020              Patient currently not stable for discharge  Barrier to discharge-plan for TAVR as per cardiology  COVID-19 Labs  No results for input(s): DDIMER, FERRITIN, LDH, CRP in the last 72 hours.  Lab Results  Component Value Date   [place] NEGATIVE 08/21/2020    Microbiology  Recent Results (from the past 240 hour(s))  Resp Panel by  RT-PCR (Flu A&B, Covid) Nasopharyngeal Swab     Status: None   Collection Time: 08/21/20  7:54 AM   Specimen: Nasopharyngeal Swab; Nasopharyngeal(NP) swabs in vial transport medium  Result Value Ref Range Status   SARS Coronavirus 2 by RT PCR NEGATIVE NEGATIVE Final    Comment: (NOTE) SARS-CoV-2 target nucleic acids are NOT DETECTED.  The SARS-CoV-2 RNA is generally detectable in upper respiratory specimens during the acute phase of infection.  The lowest concentration of SARS-CoV-2 viral copies this assay can detect is 138 copies/mL. A negative result does not preclude SARS-Cov-2 infection and should not be used as the sole basis for treatment or other patient management decisions. A negative result may occur with  improper specimen collection/handling, submission of specimen other than nasopharyngeal swab, presence of viral mutation(s) within the areas targeted by this assay, and inadequate number of viral copies(<138 copies/mL). A negative result must be combined with clinical observations, patient history, and epidemiological information. The expected result is Negative.  Fact Sheet for Patients:  EntrepreneurPulse.com.au  Fact Sheet for Healthcare Providers:  IncredibleEmployment.be  This test is no t yet approved or cleared by the [place] FDA and  has been authorized for detection and/or diagnosis of SARS-CoV-2 by FDA under an Emergency Use Authorization (EUA). This EUA will remain  in effect (meaning this test can be used) for the duration of the COVID-19 declaration under Section 564(b)(1) of the Act, 21 U.S.C.section 360bbb-3(b)(1), unless the authorization is terminated  or revoked sooner.       Influenza A by PCR NEGATIVE NEGATIVE Final   Influenza B by PCR NEGATIVE NEGATIVE Final    Comment: (NOTE) The Xpert Xpress SARS-CoV-2/FLU/RSV plus assay is intended as an aid in the diagnosis of influenza from Nasopharyngeal swab  specimens and should not be used as a sole basis for treatment. Nasal washings and aspirates are unacceptable for Xpert Xpress SARS-CoV-2/FLU/RSV testing.  Fact Sheet for Patients: EntrepreneurPulse.com.au  Fact Sheet for Healthcare Providers: IncredibleEmployment.be  This test is not yet approved or cleared by the [place] FDA and has been authorized for detection and/or diagnosis of SARS-CoV-2 by FDA under an Emergency Use Authorization (EUA). This EUA will remain in effect (meaning this test can be used) for the duration of the COVID-19 declaration under Section 564(b)(1) of the Act, 21 U.S.C. section 360bbb-3(b)(1), unless the authorization is terminated or revoked.  Performed at [facility], [place] [address] [place], [place] [address]              Oswald Hillock   [facility] If 7PM-7AM, please contact night-coverage at [email or web address], Office  [phone]   08/25/2020, 2:04 PM  LOS: 4 days

## 2020-08-25 NOTE — Plan of Care (Signed)
  Problem: Activity: Goal: Risk for activity intolerance will decrease Outcome: Progressing   Problem: Nutrition: Goal: Adequate nutrition will be maintained Outcome: Progressing   

## 2020-08-25 NOTE — Progress Notes (Signed)
Patient not found in the room upon arrival

## 2020-08-26 LAB — CBC
HCT: 36.5 % (ref 36.0–46.0)
Hemoglobin: 12.1 g/dL (ref 12.0–15.0)
MCH: 29.6 pg (ref 26.0–34.0)
MCHC: 33.2 g/dL (ref 30.0–36.0)
MCV: 89.2 fL (ref 80.0–100.0)
Platelets: 216 10*3/uL (ref 150–400)
RBC: 4.09 MIL/uL (ref 3.87–5.11)
RDW: 12.2 % (ref 11.5–15.5)
WBC: 5.9 10*3/uL (ref 4.0–10.5)
nRBC: 0 % (ref 0.0–0.2)

## 2020-08-26 LAB — BASIC METABOLIC PANEL
Anion gap: 12 (ref 5–15)
BUN: 15 mg/dL (ref 8–23)
CO2: 20 mmol/L — ABNORMAL LOW (ref 22–32)
Calcium: 9 mg/dL (ref 8.9–10.3)
Chloride: 103 mmol/L (ref 98–111)
Creatinine, Ser: 1.01 mg/dL — ABNORMAL HIGH (ref 0.44–1.00)
GFR, Estimated: 55 mL/min — ABNORMAL LOW (ref >60)
Glucose, Bld: 82 mg/dL (ref 70–99)
Potassium: 3.6 mmol/L (ref 3.5–5.1)
Sodium: 135 mmol/L (ref 135–145)

## 2020-08-26 NOTE — Progress Notes (Addendum)
Heart Failure Stewardship Pharmacist Progress Note   PCP: Marrian Salvage, FNP PCP-Cardiologist: Donato Heinz, MD    HPI:  86 yo F w/ PMH of HTN, HLD, remote light tobacco use, and breast cancer on letrozole who presented to [facility] ED 6/4 complaining of severe worsening SOB. CXR showed no edema, CT revealed consolidation in right lower lobe. Diuresed w/ IV Lasix 40mg  and out 1L. BNP 2105. ECHO 6/5 revealed EF 20-25% and severe AS. Elevated troponin (380) and underwent cath 6/6 revealing minimal CAD with severe AS. RA 8/6, PAP 56/15, PCWP 15, LVEDP 38, CI 2.1. Notes allergy to losartan and olmesartan w/ SOB.   Current HF Medications: - Lasix 40mg  PO daily - Jardiance 10mg  daily  Prior to admission HF Medications: - None  Pertinent Lab Values: Serum creatinine 1.01, BUN 15, Potassium 3.6, Sodium 135, BNP 2105 Vital Signs: Weight: 57.3kg (admission weight: 56.7kg) Blood pressure: 80-90s/60s Heart rate: 80s  Medication Assistance / Insurance Benefits Check: Does the patient have prescription insurance?  Yes Type of insurance plan: [employer]  Does the patient qualify for medication assistance through manufacturers or grants?   Yes - can utilize copay cards   Outpatient Pharmacy:  [facility] to admission [facility] Is the patient willing to use [facility] at discharge? Yes Is the patient willing to transition their outpatient pharmacy to utilize a [facility] outpatient pharmacy?   Pending    Assessment: 1. New onset systolic CHF (EF 01-74%), due to NICM. NYHA class III-IV. - No edema on exam - Desires to stay in hospital until TAVR, tentatively scheduled for 6/14 pending OR availability - Low BP limits options at this time - Continue furosemide 40 mg PO daily - Continue Jardiance 10 mg daily   Plan: 1) Medication changes recommended at this time: - Continue current regimen  2) Patient assistance: - Jardiance PA  approved through 08/24/2021, co-pay will be $27.32/mo - Co-pay card will decrease cost to $10/1-49mo  3)  Education  - Patient has been educated on current HF medications (Lasix and Jardiance). - Patient verbalizes understanding that over the next few months, these medication doses may change and more medications may be added to optimize HF regimen - Patient has been educated on basic disease state pathophysiology and goals of therapy - Time spent (85min) - HF TOC appt rescheduled for 6/21 given TAVR next week   Marya Fossa (Sean) [place], PharmD Student

## 2020-08-26 NOTE — Progress Notes (Signed)
Subjective:  Feels better today She feels safer staying in hospital until TAVR  Objective:  Vitals:   08/25/20 1609 08/25/20 2005 08/25/20 2340 08/26/20 0414  BP: 91/66 (!) 88/63 99/62 91/63   Pulse: 88 83 79 89  Resp: 18 16 16 17   Temp: 98.1 F (36.7 C) 98.4 F (36.9 C) 98.1 F (36.7 C) 98.4 F (36.9 C)  TempSrc: Oral Oral Oral Oral  SpO2: 99% 98% 100% 95%  Weight:    57.3 kg  Height:        Intake/Output from previous day:  Intake/Output Summary (Last 24 hours) at 08/26/2020 0745 Last data filed at 08/25/2020 2339 Gross per 24 hour  Intake 145 ml  Output 800 ml  Net -655 ml    Physical Exam: Elderly black female Decreased BS bases Does not have loud AS murmur  Abdomen soft No edema  Cath site A no hematoma   Lab Results: Basic Metabolic Panel: Recent Labs    08/25/20 0723 08/26/20 0239  NA 135 135  K 3.9 3.6  CL 104 103  CO2 19* 20*  GLUCOSE 96 82  BUN 16 15  CREATININE 0.98 1.01*  CALCIUM 9.0 9.0   CBC: Recent Labs    08/24/20 0357 08/26/20 0239  WBC 5.6 5.9  HGB 12.1 12.1  HCT 36.3 36.5  MCV 88.1 89.2  PLT 192 216    Imaging: CT CORONARY MORPH W/CTA COR W/SCORE W/CA W/CM &/OR WO/CM  Result Date: 08/25/2020 CLINICAL DATA:  Aortic Stenosis EXAM: Cardiac TAVR CT TECHNIQUE: The patient was scanned on a Siemens Force 622 slice scanner. A 120 kV retrospective scan was triggered in the ascending thoracic aorta at 140 HU's. Gantry rotation speed was 250 msecs and collimation was .6 mm. No beta blockade or nitro were given. The 3D data set was reconstructed in 5% intervals of the R-R cycle. Systolic and diastolic phases were analyzed on a dedicated work station using MPR, MIP and VRT modes. The patient received 80 cc of contrast. FINDINGS: Aortic Valve: Tri leaflet calcified restricted motion Calcium score 2089 Aorta: No aneurysm moderate calcific atherosclerosis Sino-tubular Junction: 23 mm Ascending Thoracic Aorta: 30 mm Descending Thoracic Aorta: 22  mm Sinus of Valsalva Measurements: Non-coronary: 26.3 mm Right - coronary: 27.2 mm Left -   coronary: 28 mm Coronary Artery Height above Annulus: Left Main: 12.7 mm above annulus Right Coronary: 13.5 mm above annulus Virtual Basal Annulus Measurements: Maximum / Minimum Diameter: 24 mm x 20.3 mm Perimeter: 73 mm Area: 400 mm2 Coronary Arteries: Sufficient height above annulus for deployment Optimum Fluoroscopic Angle for Delivery: LAO 5 Caudal 18 degrees IMPRESSION: 1.  Calcified tri leaflet aortic valve with score 2089 2.  Normal aortic root diameter 3.0 cm moderate atherosclerosis 3.  Aortic annulus 400 mm2 suitable for a 23 mm Sapien 3 valve 4. Optimum angiographic angle for deployment LAO 5 Caudal 18 degrees 5.  Coronary arteries sufficient height above annulus for deployment [name] Electronically Signed   By: [name] M.D.   On: 08/25/2020 12:45     Cardiac Studies:  ECG: SR LAD chronic lateral ST changes    Telemetry:  NSR 08/26/2020   Echo: EF 25% diffuse hypokinesis worse in septum severe AS   Medications:    aspirin  81 mg Oral Daily   empagliflozin  10 mg Oral Daily   furosemide  40 mg Oral Daily   sodium chloride flush  3 mL Intravenous Q12H   sodium chloride flush  3 mL Intravenous Q12H   sodium chloride flush  3 mL Intravenous Q12H      sodium chloride      Assessment/Plan:    1. CHF/AS:  New diagnosis  No CAD at cath 08/23/20. Would avoid ARB/ACE/ARNI with fixed AS despite low EF. EDP very elevated at cath  38 but mean PCWP 15 and CI 2.1 L/min/m2 Mean gradient 52 mmHg. CT 08/25/20 shows she is a good candidate for a 23 mm Sapien 3 valve. Structural team have discussed with OR and can add her on to next Tuesday schedule for 5 th case TAVR She has been seen by Dr Burt Knack but still needs to be seen by surgeon. Continue daily diuretic BP remains soft   [name] 08/26/2020, 7:45 AM   Patient ID: Alexandria Randolph, female   DOB: April 01, 1934, 86 y.o.   MRN: [id number]

## 2020-08-26 NOTE — Progress Notes (Signed)
[facility]  PROGRESS NOTE  Alexandria Randolph MHD:[id number] DOB: 02/19/1935 DOA: 08/21/2020 PCP: Marrian Salvage, FNP   Brief HPI:   86 year old female with past medical history of breast cancer currently on letrozole, remote history of tobacco abuse came in 1 to 2 weeks history of shortness of breath on exertion.  2D echo showed EF of 25% and grade 2 diastolic heart failure with severe aortic stenosis with gradient of 40 mmHg.    Subjective   Patient seen and examined, CTA coronary morphology done yesterday shows that patient will be a good candidate for TAVR.  Denies shortness of breath.   Assessment/Plan:     Severe aortic stenosis -Patient presented with dyspnea on exertion -2D echo showed EF 25%; severe aortic stenosis -Underwent left and right heart catheterization; showed minimal CAD -Aortic valve area 0.39 cm, mean gradient 49 mmHg -Underwent CTA coronary morphology yesterday; deemed a good candidate for TAVR -Cardiology is planning for TAVR on Tuesday   New onset CHF -Both systolic, EF 20 to 21% and diastolic with grade 2 diastolic dysfunction -Likely from underlying severe aortic stenosis -Patient on furosemide 40 mg p.o. daily -Continue aspirin, Farxiga   Elevated troponin -Troponin was elevated 372, 389 -Likely demand ischemia in the setting of new onset CHF due to severe aortic stenosis -Cardiac catheterization showed minimal CAD   Elevated D-dimer -CTA of chest was negative for pulm embolism -Venous duplex  negative for DVT   History of breast cancer -Femara on hold for now   Scheduled medications:    aspirin  81 mg Oral Daily   empagliflozin  10 mg Oral Daily   furosemide  40 mg Oral Daily   sodium chloride flush  3 mL Intravenous Q12H   sodium chloride flush  3 mL Intravenous Q12H   sodium chloride flush  3 mL Intravenous Q12H         Data Reviewed:   CBG:  No results for input(s): GLUCAP in the last 168 hours.  SpO2:  95 % O2 Flow Rate (L/min): 2 L/min    Vitals:   08/25/20 1609 08/25/20 2005 08/25/20 2340 08/26/20 0414  BP: 91/66 (!) 88/63 99/62 91/63   Pulse: 88 83 79 89  Resp: 18 16 16 17   Temp: 98.1 F (36.7 C) 98.4 F (36.9 C) 98.1 F (36.7 C) 98.4 F (36.9 C)  TempSrc: Oral Oral Oral Oral  SpO2: 99% 98% 100% 95%  Weight:    57.3 kg  Height:         Intake/Output Summary (Last 24 hours) at 08/26/2020 0933 Last data filed at 08/25/2020 2339 Gross per 24 hour  Intake 120 ml  Output 800 ml  Net -680 ml    06/07 1901 - 06/09 0700 In: 145 [P.O.:145] Out: 800 [Urine:800]  Filed Weights   08/25/20 0500 08/25/20 0624 08/26/20 0414  Weight: 63.3 kg 58.6 kg 57.3 kg    CBC:  Recent Labs  Lab 08/21/20 0725 08/22/20 0501 08/23/20 0706 08/23/20 1622 08/23/20 1626 08/24/20 0357 08/26/20 0239  WBC 5.9 6.1 5.7  --   --  5.6 5.9  HGB 13.4 13.0 12.2 11.6*  11.6* 10.9* 12.1 12.1  HCT 40.6 41.1 37.0 34.0*  34.0* 32.0* 36.3 36.5  PLT 253 250 236  --   --  192 216  MCV 91.0 93.4 89.2  --   --  88.1 89.2  MCH 30.0 29.5 29.4  --   --  29.4 29.6  MCHC 33.0 31.6 33.0  --   --  33.3 33.2  RDW 12.2 12.3 12.1  --   --  12.0 12.2  LYMPHSABS 0.9  --   --   --   --   --   --   MONOABS 0.6  --   --   --   --   --   --   EOSABS 0.0  --   --   --   --   --   --   BASOSABS 0.1  --   --   --   --   --   --     Complete metabolic panel:  Recent Labs  Lab 08/21/20 0708 08/21/20 0725 08/21/20 0725 08/21/20 8676 08/21/20 1834 08/22/20 0501 08/23/20 1950 08/23/20 1622 08/23/20 1626 08/24/20 0357 08/25/20 0723 08/26/20 0239  NA  --  140   < >  --   --  145 135 139  138 140 136 135 135  K  --  4.1   < >  --   --  4.7 3.6 3.6  3.6 3.3* 3.4* 3.9 3.6  CL  --  107   < >  --   --  109 105  --   --  105 104 103  CO2  --  19*   < >  --   --  25 19*  --   --  20* 19* 20*  GLUCOSE  --  148*   < >  --   --  89 102*  --   --  76 96 82  BUN  --  22   < >  --   --  22 17  --   --  17 16 15    CREATININE  --  1.19*   < >  --   --  1.01* 1.03*  --   --  0.98 0.98 1.01*  CALCIUM  --  9.6   < >  --   --  9.5 8.9  --   --  8.8* 9.0 9.0  AST  --  37  --   --   --   --   --   --   --   --   --   --   ALT  --  50*  --   --   --   --   --   --   --   --   --   --   ALKPHOS  --  95  --   --   --   --   --   --   --   --   --   --   BILITOT  --  1.2  --   --   --   --   --   --   --   --   --   --   ALBUMIN  --  4.0  --   --   --   --   --   --   --   --   --   --   DDIMER  --   --   --  1.17*  --   --   --   --   --   --   --   --   PROCALCITON  --   --   --   --   --  0.13  --   --   --   --   --   --  TSH  --   --   --   --  2.383  --   --   --   --   --   --   --   HGBA1C  --   --   --   --  5.4  --   --   --   --   --   --   --   BNP 2,105.0*  --   --   --   --   --   --   --   --   --   --   --    < > = values in this interval not displayed.    No results for input(s): LIPASE, AMYLASE in the last 168 hours.  Recent Labs  Lab 08/21/20 0708 08/21/20 0728 08/21/20 0754 08/22/20 0501  DDIMER  --  1.17*  --   --   BNP 2,105.0*  --   --   --   PROCALCITON  --   --   --  0.13  SARSCOV2NAA  --   --  NEGATIVE  --     ------------------------------------------------------------------------------------------------------------------ No results for input(s): CHOL, HDL, LDLCALC, TRIG, CHOLHDL, LDLDIRECT in the last 72 hours.  Lab Results  Component Value Date   HGBA1C 5.4 08/21/2020   ------------------------------------------------------------------------------------------------------------------ No results for input(s): TSH, T4TOTAL, T3FREE, THYROIDAB in the last 72 hours.  Invalid input(s): FREET3 ------------------------------------------------------------------------------------------------------------------ No results for input(s): VITAMINB12, FOLATE, FERRITIN, TIBC, IRON, RETICCTPCT in the last 72 hours.  Coagulation profile No results for input(s): INR, PROTIME in the  last 168 hours. No results for input(s): DDIMER in the last 72 hours.  Cardiac Enzymes No results for input(s): CKTOTAL, CKMB, CKMBINDEX, TROPONINI in the last 168 hours.  ------------------------------------------------------------------------------------------------------------------    Component Value Date/Time   BNP 2,105.0 (H) 08/21/2020 0708     Antibiotics: Anti-infectives (From admission, onward)    Start     Dose/Rate Route Frequency Ordered Stop   08/22/20 1000  cefdinir (OMNICEF) capsule 300 mg  Status:  Discontinued        300 mg Oral Every 12 hours 08/22/20 0856 08/23/20 0719   08/22/20 1000  azithromycin (ZITHROMAX) tablet 500 mg  Status:  Discontinued        500 mg Oral Daily 08/22/20 0856 08/23/20 0719   08/21/20 1500  cefTRIAXone (ROCEPHIN) 1 g in sodium chloride 0.9 % 100 mL IVPB        1 g 200 mL/hr over 30 Minutes Intravenous  Once 08/21/20 1455 08/21/20 1540   08/21/20 1500  azithromycin (ZITHROMAX) 500 mg in sodium chloride 0.9 % 250 mL IVPB  Status:  Discontinued        500 mg 250 mL/hr over 60 Minutes Intravenous  Once 08/21/20 1455 08/21/20 1611        Radiology Reports  CT CORONARY MORPH W/CTA COR W/SCORE W/CA W/CM &/OR WO/CM  Addendum Date: 08/26/2020   ADDENDUM REPORT: 08/26/2020 08:56 EXAM: OVER-READ INTERPRETATION  CT CHEST The following report is an over-read performed by radiologist Dr. Samara Snide [facility] on 08/26/2020. This over-read does not include interpretation of cardiac or coronary anatomy or pathology. The coronary CTA interpretation by the cardiologist is attached. COMPARISON:  08/21/2020 chest CT angiogram. FINDINGS: Please see the separate concurrent chest CT angiogram report for details. IMPRESSION: Please see the separate concurrent chest CT angiogram report for details. Electronically Signed   By: Ilona Sorrel M.D.   On: 08/26/2020 08:56   Result  Date: 08/26/2020 CLINICAL DATA:  Aortic Stenosis EXAM: Cardiac TAVR CT  TECHNIQUE: The patient was scanned on a Siemens Force 829 slice scanner. A 120 kV retrospective scan was triggered in the ascending thoracic aorta at 140 HU's. Gantry rotation speed was 250 msecs and collimation was .6 mm. No beta blockade or nitro were given. The 3D data set was reconstructed in 5% intervals of the R-R cycle. Systolic and diastolic phases were analyzed on a dedicated work station using MPR, MIP and VRT modes. The patient received 80 cc of contrast. FINDINGS: Aortic Valve: Tri leaflet calcified restricted motion Calcium score 2089 Aorta: No aneurysm moderate calcific atherosclerosis Sino-tubular Junction: 23 mm Ascending Thoracic Aorta: 30 mm Descending Thoracic Aorta: 22 mm Sinus of Valsalva Measurements: Non-coronary: 26.3 mm Right - coronary: 27.2 mm Left -   coronary: 28 mm Coronary Artery Height above Annulus: Left Main: 12.7 mm above annulus Right Coronary: 13.5 mm above annulus Virtual Basal Annulus Measurements: Maximum / Minimum Diameter: 24 mm x 20.3 mm Perimeter: 73 mm Area: 400 mm2 Coronary Arteries: Sufficient height above annulus for deployment Optimum Fluoroscopic Angle for Delivery: LAO 5 Caudal 18 degrees IMPRESSION: 1.  Calcified tri leaflet aortic valve with score 2089 2.  Normal aortic root diameter 3.0 cm moderate atherosclerosis 3.  Aortic annulus 400 mm2 suitable for a 23 mm Sapien 3 valve 4. Optimum angiographic angle for deployment LAO 5 Caudal 18 degrees 5.  Coronary arteries sufficient height above annulus for deployment [name] Electronically Signed: By: [name] M.D. On: 08/25/2020 12:45   CT ANGIO CHEST AORTA W/CM & OR WO/CM  Result Date: 08/26/2020 CLINICAL DATA:  Inpatient.  Aortic stenosis.  TAVR planning. EXAM: CT ANGIOGRAPHY CHEST, ABDOMEN AND PELVIS TECHNIQUE: Multidetector CT imaging through the chest, abdomen and pelvis was performed using the standard protocol during bolus administration of intravenous contrast. Multiplanar reconstructed images and  MIPs were obtained and reviewed to evaluate the vascular anatomy. CONTRAST:  177mL OMNIPAQUE IOHEXOL 350 MG/ML SOLN COMPARISON:  08/21/2020 chest CT angiogram. FINDINGS: CTA CHEST FINDINGS Cardiovascular: Borderline mild cardiomegaly. Trace pericardial effusion/thickening. Diffusely thickening coarsely calcified aortic valve. Three-vessel coronary atherosclerosis. Atherosclerotic nonaneurysmal thoracic aorta. Top-normal caliber main pulmonary artery (3.0 cm diameter). No central pulmonary emboli. Mediastinum/Nodes: No discrete thyroid nodules. Unremarkable esophagus. No pathologically enlarged axillary, mediastinal or hilar lymph nodes. Lungs/Pleura: No pneumothorax. Small dependent bilateral pleural effusions. Mild centrilobular emphysema with diffuse bronchial wall thickening. Irregular masslike focus of consolidation in the posterior right lower lobe measures 4.1 x 2.2 cm (series 6/image 52), not appreciably changed from 08/21/2020. Irregular nodular focus of consolidation in the posterior right upper lobe measures 2.1 x 1.5 cm (series 6/image 36), stable in size and slightly more organized since 08/21/2020. No additional significant pulmonary nodules. Musculoskeletal: No aggressive appearing focal osseous lesions. Moderate thoracic spondylosis. CTA ABDOMEN AND PELVIS FINDINGS Hepatobiliary: Normal liver with no liver mass. Small amount of layering hyperdense material within the distended gallbladder, cannot exclude small gallstones. No gallbladder wall thickening or pericholecystic fluid. No biliary ductal dilatation. Pancreas: Normal, with no mass or duct dilation. Spleen: Normal size. No mass. Adrenals/Urinary Tract: Normal adrenals. No hydronephrosis. Simple 2.4 cm anterior upper left renal cyst. No suspicious contour deforming renal lesions. Normal bladder. Stomach/Bowel: Small hiatal hernia. Otherwise normal nondistended stomach. Normal caliber small bowel with no small bowel wall thickening. Normal  appendix. Mild left colonic diverticulosis with no large bowel wall thickening or significant pericolonic fat stranding. Vascular/Lymphatic: Atherosclerotic nonaneurysmal abdominal aorta. No pathologically enlarged lymph  nodes in the abdomen or pelvis. Reproductive: Status post hysterectomy, with no abnormal findings at the vaginal cuff. No adnexal mass. Other: No pneumoperitoneum, ascites or focal fluid collection. Musculoskeletal: No aggressive appearing focal osseous lesions. Moderate lumbar spondylosis. VASCULAR MEASUREMENTS PERTINENT TO TAVR: AORTA: Minimal Aortic Diameter-10.3 x 10.0 mm Severity of Aortic Calcification-moderate RIGHT PELVIS: Right Common Iliac Artery - Minimal Diameter-7.3 x 5.3 mm Tortuosity-mild Calcification-severe Right External Iliac Artery - Minimal Diameter-5.2 x 5.0 mm Tortuosity-mild Calcification-mild Right Common Femoral Artery - Minimal Diameter-5.3 x 4.5 mm Tortuosity-mild Calcification-moderate LEFT PELVIS: Left Common Iliac Artery - Minimal Diameter-7.2 x 5.6 mm Tortuosity-mild Calcification-moderate Left External Iliac Artery - Minimal Diameter-5.2 x 4.9 mm Tortuosity-mild Calcification-mild Left Common Femoral Artery - Minimal Diameter-5.1 x 4.4 mm Tortuosity-mild Calcification-moderate Review of the MIP images confirms the above findings. IMPRESSION: 1. Vascular findings and measurements pertinent to potential TAVR procedure, as detailed. 2. Diffuse thickening and coarse calcification of the aortic valve, compatible with the reported history of aortic stenosis. 3. Borderline mild cardiomegaly. Trace pericardial effusion/thickening. Three-vessel coronary atherosclerosis. 4. Small dependent bilateral pleural effusions. 5. Irregular nodular and masslike focus of consolidation in the posterior right lower and posterior right upper lung lobes, stable in size and slightly more organized since 08/21/2020 chest CT, indeterminate for pneumonia versus neoplasm. Short-term follow-up  chest CT recommended in 2-3 months, preferably after a trial of antibiotic therapy. 6. Mild centrilobular emphysema with diffuse bronchial wall thickening, suggesting COPD. 7. Small hiatal hernia. 8. Mild left colonic diverticulosis. 9. Small amount of layering hyperdense material within the distended gallbladder, cannot exclude small gallstones. 10. Aortic Atherosclerosis (ICD10-I70.0) and Emphysema (ICD10-J43.9). Electronically Signed   By: Ilona Sorrel M.D.   On: 08/26/2020 09:25   CT Angio Abd/Pel w/ and/or w/o  Result Date: 08/26/2020 CLINICAL DATA:  Inpatient.  Aortic stenosis.  TAVR planning. EXAM: CT ANGIOGRAPHY CHEST, ABDOMEN AND PELVIS TECHNIQUE: Multidetector CT imaging through the chest, abdomen and pelvis was performed using the standard protocol during bolus administration of intravenous contrast. Multiplanar reconstructed images and MIPs were obtained and reviewed to evaluate the vascular anatomy. CONTRAST:  154mL OMNIPAQUE IOHEXOL 350 MG/ML SOLN COMPARISON:  08/21/2020 chest CT angiogram. FINDINGS: CTA CHEST FINDINGS Cardiovascular: Borderline mild cardiomegaly. Trace pericardial effusion/thickening. Diffusely thickening coarsely calcified aortic valve. Three-vessel coronary atherosclerosis. Atherosclerotic nonaneurysmal thoracic aorta. Top-normal caliber main pulmonary artery (3.0 cm diameter). No central pulmonary emboli. Mediastinum/Nodes: No discrete thyroid nodules. Unremarkable esophagus. No pathologically enlarged axillary, mediastinal or hilar lymph nodes. Lungs/Pleura: No pneumothorax. Small dependent bilateral pleural effusions. Mild centrilobular emphysema with diffuse bronchial wall thickening. Irregular masslike focus of consolidation in the posterior right lower lobe measures 4.1 x 2.2 cm (series 6/image 52), not appreciably changed from 08/21/2020. Irregular nodular focus of consolidation in the posterior right upper lobe measures 2.1 x 1.5 cm (series 6/image 36), stable in size and  slightly more organized since 08/21/2020. No additional significant pulmonary nodules. Musculoskeletal: No aggressive appearing focal osseous lesions. Moderate thoracic spondylosis. CTA ABDOMEN AND PELVIS FINDINGS Hepatobiliary: Normal liver with no liver mass. Small amount of layering hyperdense material within the distended gallbladder, cannot exclude small gallstones. No gallbladder wall thickening or pericholecystic fluid. No biliary ductal dilatation. Pancreas: Normal, with no mass or duct dilation. Spleen: Normal size. No mass. Adrenals/Urinary Tract: Normal adrenals. No hydronephrosis. Simple 2.4 cm anterior upper left renal cyst. No suspicious contour deforming renal lesions. Normal bladder. Stomach/Bowel: Small hiatal hernia. Otherwise normal nondistended stomach. Normal caliber small bowel with no small bowel wall thickening. Normal  appendix. Mild left colonic diverticulosis with no large bowel wall thickening or significant pericolonic fat stranding. Vascular/Lymphatic: Atherosclerotic nonaneurysmal abdominal aorta. No pathologically enlarged lymph nodes in the abdomen or pelvis. Reproductive: Status post hysterectomy, with no abnormal findings at the vaginal cuff. No adnexal mass. Other: No pneumoperitoneum, ascites or focal fluid collection. Musculoskeletal: No aggressive appearing focal osseous lesions. Moderate lumbar spondylosis. VASCULAR MEASUREMENTS PERTINENT TO TAVR: AORTA: Minimal Aortic Diameter-10.3 x 10.0 mm Severity of Aortic Calcification-moderate RIGHT PELVIS: Right Common Iliac Artery - Minimal Diameter-7.3 x 5.3 mm Tortuosity-mild Calcification-severe Right External Iliac Artery - Minimal Diameter-5.2 x 5.0 mm Tortuosity-mild Calcification-mild Right Common Femoral Artery - Minimal Diameter-5.3 x 4.5 mm Tortuosity-mild Calcification-moderate LEFT PELVIS: Left Common Iliac Artery - Minimal Diameter-7.2 x 5.6 mm Tortuosity-mild Calcification-moderate Left External Iliac Artery - Minimal  Diameter-5.2 x 4.9 mm Tortuosity-mild Calcification-mild Left Common Femoral Artery - Minimal Diameter-5.1 x 4.4 mm Tortuosity-mild Calcification-moderate Review of the MIP images confirms the above findings. IMPRESSION: 1. Vascular findings and measurements pertinent to potential TAVR procedure, as detailed. 2. Diffuse thickening and coarse calcification of the aortic valve, compatible with the reported history of aortic stenosis. 3. Borderline mild cardiomegaly. Trace pericardial effusion/thickening. Three-vessel coronary atherosclerosis. 4. Small dependent bilateral pleural effusions. 5. Irregular nodular and masslike focus of consolidation in the posterior right lower and posterior right upper lung lobes, stable in size and slightly more organized since 08/21/2020 chest CT, indeterminate for pneumonia versus neoplasm. Short-term follow-up chest CT recommended in 2-3 months, preferably after a trial of antibiotic therapy. 6. Mild centrilobular emphysema with diffuse bronchial wall thickening, suggesting COPD. 7. Small hiatal hernia. 8. Mild left colonic diverticulosis. 9. Small amount of layering hyperdense material within the distended gallbladder, cannot exclude small gallstones. 10. Aortic Atherosclerosis (ICD10-I70.0) and Emphysema (ICD10-J43.9). Electronically Signed   By: Ilona Sorrel M.D.   On: 08/26/2020 09:25       DVT prophylaxis: SCDs, start 08/25/2020-we will switch to Lovenox after TAVR  Code Status: DNR  Family Communication: No family at bedside   Consultants: Cardiology  Procedures:     Objective    Physical Examination:  General-appears in no acute distress Heart-S1-S2, regular, grade 2/6 murmur auscultated at the aortic area Lungs-clear to auscultation bilaterally, no wheezing or crackles auscultated Abdomen-soft, nontender, no organomegaly Extremities-no edema in the lower extremities Neuro-alert, oriented x3, no focal deficit noted   Status is:  Inpatient  Dispo: The patient is from: Home              Anticipated d/c is to: Home              Anticipated d/c date is: 09/01/2020              Patient currently not stable for discharge  Barrier to discharge-plan for TAVR as per cardiology  COVID-19 Labs  No results for input(s): DDIMER, FERRITIN, LDH, CRP in the last 72 hours.  Lab Results  Component Value Date   [place] NEGATIVE 08/21/2020    Microbiology  Recent Results (from the past 240 hour(s))  Resp Panel by RT-PCR (Flu A&B, Covid) Nasopharyngeal Swab     Status: None   Collection Time: 08/21/20  7:54 AM   Specimen: Nasopharyngeal Swab; Nasopharyngeal(NP) swabs in vial transport medium  Result Value Ref Range Status   SARS Coronavirus 2 by RT PCR NEGATIVE NEGATIVE Final    Comment: (NOTE) SARS-CoV-2 target nucleic acids are NOT DETECTED.  The SARS-CoV-2 RNA is generally detectable in upper respiratory specimens during the  acute phase of infection. The lowest concentration of SARS-CoV-2 viral copies this assay can detect is 138 copies/mL. A negative result does not preclude SARS-Cov-2 infection and should not be used as the sole basis for treatment or other patient management decisions. A negative result may occur with  improper specimen collection/handling, submission of specimen other than nasopharyngeal swab, presence of viral mutation(s) within the areas targeted by this assay, and inadequate number of viral copies(<138 copies/mL). A negative result must be combined with clinical observations, patient history, and epidemiological information. The expected result is Negative.  Fact Sheet for Patients:  EntrepreneurPulse.com.au  Fact Sheet for Healthcare Providers:  IncredibleEmployment.be  This test is no t yet approved or cleared by the [place] FDA and  has been authorized for detection and/or diagnosis of SARS-CoV-2 by FDA under an Emergency Use Authorization  (EUA). This EUA will remain  in effect (meaning this test can be used) for the duration of the COVID-19 declaration under Section 564(b)(1) of the Act, 21 U.S.C.section 360bbb-3(b)(1), unless the authorization is terminated  or revoked sooner.       Influenza A by PCR NEGATIVE NEGATIVE Final   Influenza B by PCR NEGATIVE NEGATIVE Final    Comment: (NOTE) The Xpert Xpress SARS-CoV-2/FLU/RSV plus assay is intended as an aid in the diagnosis of influenza from Nasopharyngeal swab specimens and should not be used as a sole basis for treatment. Nasal washings and aspirates are unacceptable for Xpert Xpress SARS-CoV-2/FLU/RSV testing.  Fact Sheet for Patients: EntrepreneurPulse.com.au  Fact Sheet for Healthcare Providers: IncredibleEmployment.be  This test is not yet approved or cleared by the [place] FDA and has been authorized for detection and/or diagnosis of SARS-CoV-2 by FDA under an Emergency Use Authorization (EUA). This EUA will remain in effect (meaning this test can be used) for the duration of the COVID-19 declaration under Section 564(b)(1) of the Act, 21 U.S.C. section 360bbb-3(b)(1), unless the authorization is terminated or revoked.  Performed at [facility], [place] [address] [place], [place] [address]              Oswald Hillock   [facility] If 7PM-7AM, please contact night-coverage at [email or web address], Office  [phone]   08/26/2020, 9:33 AM  LOS: 5 days

## 2020-08-26 NOTE — Progress Notes (Addendum)
[facility] VALVE TEAM  Patient Name: Alexandria Randolph Date of Encounter: 08/26/2020  Primary Cardiologist: Dr. Cleotis Nipper Problem List     Principal Problem:   Acute CHF (congestive heart failure) [facility]) Active Problems:   Malignant neoplasm of upper-outer quadrant of right breast in female, estrogen receptor positive (HCC)   Elevated d-dimer   Severe aortic stenosis     Subjective   Feeling better today. Feels like she needs to stay in the hospital for surgeyr.   Inpatient Medications    Scheduled Meds:  aspirin  81 mg Oral Daily   empagliflozin  10 mg Oral Daily   furosemide  40 mg Oral Daily   sodium chloride flush  3 mL Intravenous Q12H   sodium chloride flush  3 mL Intravenous Q12H   sodium chloride flush  3 mL Intravenous Q12H   Continuous Infusions:  sodium chloride     PRN Meds: sodium chloride, acetaminophen **OR** acetaminophen, morphine injection, ondansetron (ZOFRAN) IV, polyethylene glycol, sodium chloride flush   Vital Signs    Vitals:   08/25/20 1609 08/25/20 2005 08/25/20 2340 08/26/20 0414  BP: 91/66 (!) 88/63 99/62 91/63   Pulse: 88 83 79 89  Resp: 18 16 16 17   Temp: 98.1 F (36.7 C) 98.4 F (36.9 C) 98.1 F (36.7 C) 98.4 F (36.9 C)  TempSrc: Oral Oral Oral Oral  SpO2: 99% 98% 100% 95%  Weight:    57.3 kg  Height:        Intake/Output Summary (Last 24 hours) at 08/26/2020 0911 Last data filed at 08/25/2020 2339 Gross per 24 hour  Intake 120 ml  Output 800 ml  Net -680 ml   Filed Weights   08/25/20 0500 08/25/20 0624 08/26/20 0414  Weight: 63.3 kg 58.6 kg 57.3 kg    Physical Exam    GEN: Well nourished, well developed, in no acute distress.  HEENT: Grossly normal.  Neck: Supple, no JVD or masses. Cardiac: RRR, soft systolic murmur., rubs, or gallops. No clubbing, cyanosis, edema.   Respiratory:  decreased breath sounds at bases GI: Soft, nontender, nondistended, BS + x 4. MS: no  deformity or atrophy. Skin: warm and dry, no rash. Neuro:  Strength and sensation are intact. Psych: AAOx3.  Normal affect.  Labs    CBC Recent Labs    08/24/20 0357 08/26/20 0239  WBC 5.6 5.9  HGB 12.1 12.1  HCT 36.3 36.5  MCV 88.1 89.2  PLT 192 841   Basic Metabolic Panel Recent Labs    08/25/20 0723 08/26/20 0239  NA 135 135  K 3.9 3.6  CL 104 103  CO2 19* 20*  GLUCOSE 96 82  BUN 16 15  CREATININE 0.98 1.01*  CALCIUM 9.0 9.0   Liver Function Tests No results for input(s): AST, ALT, ALKPHOS, BILITOT, PROT, ALBUMIN in the last 72 hours. No results for input(s): LIPASE, AMYLASE in the last 72 hours. Cardiac Enzymes No results for input(s): CKTOTAL, CKMB, CKMBINDEX, TROPONINI in the last 72 hours. BNP Invalid input(s): POCBNP D-Dimer No results for input(s): DDIMER in the last 72 hours. Hemoglobin A1C No results for input(s): HGBA1C in the last 72 hours. Fasting Lipid Panel No results for input(s): CHOL, HDL, LDLCALC, TRIG, CHOLHDL, LDLDIRECT in the last 72 hours. Thyroid Function Tests No results for input(s): TSH, T4TOTAL, T3FREE, THYROIDAB in the last 72 hours.  Invalid input(s): FREET3  Telemetry    sinus - Personally Reviewed  ECG  Sinus tachy  - Personally Reviewed  Radiology    CT CORONARY MORPH W/CTA COR W/SCORE W/CA W/CM &/OR WO/CM  Addendum Date: 08/26/2020   ADDENDUM REPORT: 08/26/2020 08:56 EXAM: OVER-READ INTERPRETATION  CT CHEST The following report is an over-read performed by radiologist Dr. Samara Snide [facility] [place] on 08/26/2020. This over-read does not include interpretation of cardiac or coronary anatomy or pathology. The coronary CTA interpretation by the cardiologist is attached. COMPARISON:  08/21/2020 chest CT angiogram. FINDINGS: Please see the separate concurrent chest CT angiogram report for details. IMPRESSION: Please see the separate concurrent chest CT angiogram report for details. Electronically Signed   By: Ilona Sorrel M.D.   On: 08/26/2020 08:56   Result Date: 08/26/2020 CLINICAL DATA:  Aortic Stenosis EXAM: Cardiac TAVR CT TECHNIQUE: The patient was scanned on a Siemens Force 026 slice scanner. A 120 kV retrospective scan was triggered in the ascending thoracic aorta at 140 HU's. Gantry rotation speed was 250 msecs and collimation was .6 mm. No beta blockade or nitro were given. The 3D data set was reconstructed in 5% intervals of the R-R cycle. Systolic and diastolic phases were analyzed on a dedicated work station using MPR, MIP and VRT modes. The patient received 80 cc of contrast. FINDINGS: Aortic Valve: Tri leaflet calcified restricted motion Calcium score 2089 Aorta: No aneurysm moderate calcific atherosclerosis Sino-tubular Junction: 23 mm Ascending Thoracic Aorta: 30 mm Descending Thoracic Aorta: 22 mm Sinus of Valsalva Measurements: Non-coronary: 26.3 mm Right - coronary: 27.2 mm Left -   coronary: 28 mm Coronary Artery Height above Annulus: Left Main: 12.7 mm above annulus Right Coronary: 13.5 mm above annulus Virtual Basal Annulus Measurements: Maximum / Minimum Diameter: 24 mm x 20.3 mm Perimeter: 73 mm Area: 400 mm2 Coronary Arteries: Sufficient height above annulus for deployment Optimum Fluoroscopic Angle for Delivery: LAO 5 Caudal 18 degrees IMPRESSION: 1.  Calcified tri leaflet aortic valve with score 2089 2.  Normal aortic root diameter 3.0 cm moderate atherosclerosis 3.  Aortic annulus 400 mm2 suitable for a 23 mm Sapien 3 valve 4. Optimum angiographic angle for deployment LAO 5 Caudal 18 degrees 5.  Coronary arteries sufficient height above annulus for deployment [name] Electronically Signed: By: [name] M.D. On: 08/25/2020 12:45    Cardiac Studies   Echo 08/22/20 IMPRESSIONS   1. Left ventricular ejection fraction, by estimation, is 20 to 25%. The  left ventricle has severely decreased function. The left ventricle  demonstrates global hypokinesis. There is mild left ventricular   hypertrophy. Left ventricular diastolic parameters   are consistent with Grade II diastolic dysfunction (pseudonormalization).  Elevated left atrial pressure.   2. Right ventricular systolic function is mildly reduced. The right  ventricular size is normal. Tricuspid regurgitation signal is inadequate  for assessing PA pressure.   3. The mitral valve is normal in structure. Trivial mitral valve  regurgitation.   4. The aortic valve is calcified. There is severe calcifcation of the  aortic valve. Aortic valve regurgitation is trivial. Severe aortic valve  stenosis. Vmax 4.3 m/s, MG 49 mmHg, AVA 0.7 cm^2, DI 0.26   5. The inferior vena cava is normal in size with <50% respiratory  variability, suggesting right atrial pressure of 8 mmHg.   FINDINGS   Left Ventricle: Left ventricular ejection fraction, by estimation, is 20  to 25%. The left ventricle has severely decreased function. The left  ventricle demonstrates global hypokinesis. The left ventricular internal  cavity size was normal in size.  There  is mild left ventricular hypertrophy. Left ventricular diastolic  parameters are consistent with Grade II diastolic dysfunction  (pseudonormalization). Elevated left atrial pressure.   Right Ventricle: The right ventricular size is normal. Right vetricular  wall thickness was not well visualized. Right ventricular systolic  function is mildly reduced. Tricuspid regurgitation signal is inadequate  for assessing PA pressure.   Left Atrium: Left atrial size was normal in size.   Right Atrium: Right atrial size was normal in size.   Pericardium: There is no evidence of pericardial effusion.   Mitral Valve: The mitral valve is normal in structure. Trivial mitral  valve regurgitation.   Tricuspid Valve: The tricuspid valve is normal in structure. Tricuspid  valve regurgitation is trivial.   Aortic Valve: The aortic valve is calcified. There is severe calcifcation  of the aortic valve.  Aortic valve regurgitation is trivial. Severe aortic  stenosis is present. Aortic valve mean gradient measures 40.2 mmHg. Aortic  valve peak gradient measures  66.2 mmHg. Aortic valve area, by VTI measures 0.82 cm.   Pulmonic Valve: The pulmonic valve was not well visualized. Pulmonic valve  regurgitation is not visualized.   Aorta: The aortic root and ascending aorta are structurally normal, with  no evidence of dilitation.   Venous: The inferior vena cava is normal in size with less than 50%  respiratory variability, suggesting right atrial pressure of 8 mmHg.   IAS/Shunts: The interatrial septum was not well visualized.      LEFT VENTRICLE  PLAX 2D  LVIDd:         4.40 cm     Diastology  LVIDs:         3.80 cm     LV e' medial:    3.26 cm/s  LV PW:         1.30 cm     LV E/e' medial:  31.0  LV IVS:        1.20 cm     LV e' lateral:   4.68 cm/s  LVOT diam:     1.90 cm     LV E/e' lateral: 21.6  LV SV:         77  LV SV Index:   49  LVOT Area:     2.84 cm     LV Volumes (MOD)  LV vol d, MOD A2C: 79.7 ml  LV vol d, MOD A4C: 44.9 ml  LV vol s, MOD A2C: 78.1 ml  LV vol s, MOD A4C: 55.6 ml  LV SV MOD A2C:     1.6 ml  LV SV MOD A4C:     44.9 ml  LV SV MOD BP:      15.1 ml   RIGHT VENTRICLE            IVC  RV S prime:     6.31 cm/s  IVC diam: 1.80 cm  TAPSE (M-mode): 1.3 cm   LEFT ATRIUM             Index       RIGHT ATRIUM          Index  LA diam:        3.40 cm 2.18 cm/m  RA Area:     8.95 cm  LA Vol (A2C):   29.0 ml 18.61 ml/m RA Volume:   17.40 ml 11.17 ml/m  LA Vol (A4C):   40.9 ml 26.25 ml/m  LA Biplane Vol: 37.4 ml 24.00 ml/m   AORTIC VALVE  PULMONIC VALVE  AV Area (Vmax):    0.91 cm     PR End Diast Vel: 2.66 msec  AV Area (Vmean):   0.84 cm  AV Area (VTI):     0.82 cm  AV Vmax:           406.80 cm/s  AV Vmean:          298.600 cm/s  AV VTI:            0.937 m  AV Peak Grad:      66.2 mmHg  AV Mean Grad:      40.2 mmHg  LVOT Vmax:          131.00 cm/s  LVOT Vmean:        88.700 cm/s  LVOT VTI:          0.271 m  LVOT/AV VTI ratio: 0.29     AORTA  Ao Root diam: 2.90 cm  Ao Asc diam:  3.30 cm   MITRAL VALVE  MV Area (PHT): 9.85 cm     SHUNTS  MV Decel Time: 77 msec      Systemic VTI:  0.27 m  MV E velocity: 101.00 cm/s  Systemic Diam: 1.90 cm  MV A velocity: 61.30 cm/s  MV E/A ratio:  1.65    _____________________   Carotid dopplers 08/23/20 Summary:  Right Carotid: Velocities in the right ICA are consistent with a 1-39%  stenosis.   Left Carotid: Velocities in the left ICA are consistent with a 1-39%  stenosis.   Vertebrals:  Bilateral vertebral arteries demonstrate antegrade flow.  Subclavians: Normal flow hemodynamics were seen in bilateral subclavian               arteries.   _______________________  [facility] 08/23/20 Conclusion    Ost LM lesion is 30% stenosed. Hemodynamic findings consistent with aortic valve stenosis.    IMPRESSION: Ms. Vazguez has minimal CAD with at most 35 to 40% ostial left main.  She has critical AS with a peak to peak gradient of 72 mmHg and a valve area of 0.39 cm indexed to 0.25 cm/m.  She has severe LV dysfunction.  She may be a good candidate for TAVR.  Her LVEDP was elevated at 38 and she received 40 mg of Lasix IV prior to leaving the Cath Lab.  Dr. Johnsie Cancel, her attending cardiologist, was notified of these results.  The sheaths were removed and MYNX closure devices were deployed in both the right common femoral artery and vein achieving hemostasis.  The patient left lab in stable condition.   ________________________  Cardiac CT 08/25/20 IMPRESSION: 1.  Calcified tri leaflet aortic valve with score 2089   2.  Normal aortic root diameter 3.0 cm moderate atherosclerosis   3.  Aortic annulus 400 mm2 suitable for a 23 mm Sapien 3 valve   4. Optimum angiographic angle for deployment LAO 5 Caudal 18 degrees   5.  Coronary arteries sufficient height above annulus for  deployment   [name]   Patient Profile     KALE DOLS is a 86 y.o. female with a hx of HTN, HLD, breast cancer s/p lumpectomy and radioactive seed Rx (2018) and remote, light tobacco abuse who is was admitted to [facility] on 08/21/20 for progressive shortness of breath and found to have critical AS and LV dysfunction with acute CHF. The structural heart team was consulted for consideration of TAVR.  Assessment & Plan    Critical AS: echo showed  mean gradient >40 despite severe LV dysfunction with an EF of 20-25%. L/RHC showed no obstructive CAD but severely elevated LVEDP at 38. She has been diuresing well although this has been limited somewhat by hypotension. She has undergone the entire TAVR work up while admitted and looks to size to a 23 mm Edwards S3U valve. CTA studies are still pending formal read. Will need to be seen by a surgeon next Monday. Tentative plan is for inpatient TAVR next Tuesday, if the OR can accommodate this.   Acute on chronic combined S/D CHF: continue diuresis under the care of Dr. Johnsie Cancel. Cardiomyopathy felt to be related to severe AS and hopefully EF will improve after TAVR.  SignedAngelena Form, PA-C  08/26/2020, 9:11 AM  Pager [phone]  Patient seen, examined. Available data reviewed. Agree with findings, assessment, and plan as outlined by Nell Range, PA-C. The patient is feeling better, no resting dyspnea. We discussed plans fr transfemoral TAVR next week. Her questions are answered. She feels relieved to stay in the hospital as she was very concerned about her risk of going home and having problems. We plan to proceed with TF-TAVR with a 23 mm Sapien valve next Tuesday.  Sherren Mocha, M.D. 08/27/2020 1:03 PM

## 2020-08-26 NOTE — Progress Notes (Signed)
Physical Therapy Treatment Patient Details Name: Alexandria Randolph MRN: [id number] DOB: 08-Jun-1934 Today's Date: 08/26/2020    History of Present Illness Patient is a 86 y/o female who presents on 08/21/20 as a tx from [facility] due to SOB and weakness. BNP 2105. Chest CTA- multifocal PNA, bil pleral effusions and possible bronchitis. Concern for new CHF, aortic stenosis, Elevated D-dimer.  s/p cardiac cath 08/23/20; pre TAVR workup pending. PMH includes HTN, dyslipidemia, breast ca, tobacco abuse.    PT Comments    Pt performs bed mobility, transfers, and ambulates without physical assistance. Pt tolerates gait of household distances with rollator, demonstrating increased WOB and requiring multiple standing rest breaks. Pt continues to demonstrate generalized weakness, decreased endurance, and increased WOB with activity and will benefit from continued acute PT to improve activity tolerance.   Follow Up Recommendations  Home health PT;Supervision - Intermittent     Equipment Recommendations  Other (comment) (rollator)    Recommendations for Other Services       Precautions / Restrictions Precautions Precautions: Fall Restrictions Weight Bearing Restrictions: No    Mobility  Bed Mobility Overal bed mobility: Modified Independent             General bed mobility comments: HOB elevated    Transfers Overall transfer level: Needs assistance Equipment used: 4-wheeled walker Transfers: Sit to/from Stand Sit to Stand: Min guard         General transfer comment: min G for safety. cues for brakes and hand placement.  Ambulation/Gait Ambulation/Gait assistance: Min guard Gait Distance (Feet): 200 Feet Assistive device: 4-wheeled walker Gait Pattern/deviations: Step-through pattern;Decreased stride length Gait velocity: decreased Gait velocity interpretation: 1.31 - 2.62 ft/sec, indicative of limited community ambulator General Gait Details: slow, mostly steady gait with rollator  and multiple standing rest breaks. VSS on RA throughout.   Stairs             Wheelchair Mobility    Modified Rankin (Stroke Patients Only)       Balance Overall balance assessment: Needs assistance Sitting-balance support: Feet supported;No upper extremity supported Sitting balance-Leahy Scale: Good     Standing balance support: During functional activity Standing balance-Leahy Scale: Fair Standing balance comment: Pt able to stand statically briefly without UE support while donning face mask.                            Cognition Arousal/Alertness: Awake/alert Behavior During Therapy: WFL for tasks assessed/performed Overall Cognitive Status: Within Functional Limits for tasks assessed                                        Exercises      General Comments General comments (skin integrity, edema, etc.): VSS on RA.      Pertinent Vitals/Pain Pain Assessment: No/denies pain    Home Living                      Prior Function            PT Goals (current goals can now be found in the care plan section) Acute Rehab PT Goals Patient Stated Goal: to get better and go home Progress towards PT goals: Progressing toward goals    Frequency    Min 3X/week      PT Plan Current plan remains appropriate    Co-evaluation  AM-PAC PT "6 Clicks" Mobility   Outcome Measure  Help needed turning from your back to your side while in a flat bed without using bedrails?: None Help needed moving from lying on your back to sitting on the side of a flat bed without using bedrails?: None Help needed moving to and from a bed to a chair (including a wheelchair)?: A Little Help needed standing up from a chair using your arms (e.g., wheelchair or bedside chair)?: A Little Help needed to walk in hospital room?: A Little Help needed climbing 3-5 steps with a railing? : A Little 6 Click Score: 20    End of Session  Equipment Utilized During Treatment: Gait belt Activity Tolerance: Patient limited by fatigue Patient left: in bed;with call bell/phone within reach;with family/visitor present;with bed alarm set Nurse Communication: Mobility status PT Visit Diagnosis: Muscle weakness (generalized) (M62.81);Difficulty in walking, not elsewhere classified (R26.2);Unsteadiness on feet (R26.81)     Time: 8022-3361 PT Time Calculation (min) (ACUTE ONLY): 23 min  Charges:  $Gait Training: 8-22 mins $Therapeutic Activity: 8-22 mins                     Acute Rehab  Pager: [phone]    Garwin Brothers, SPT  08/26/2020, 1:19 PM

## 2020-08-27 DIAGNOSIS — E876 Hypokalemia: Secondary | ICD-10-CM

## 2020-08-27 LAB — BASIC METABOLIC PANEL
Anion gap: 11 (ref 5–15)
BUN: 13 mg/dL (ref 8–23)
CO2: 22 mmol/L (ref 22–32)
Calcium: 9 mg/dL (ref 8.9–10.3)
Chloride: 103 mmol/L (ref 98–111)
Creatinine, Ser: 0.92 mg/dL (ref 0.44–1.00)
GFR, Estimated: >60 mL/min (ref >60)
Glucose, Bld: 105 mg/dL — ABNORMAL HIGH (ref 70–99)
Potassium: 3.4 mmol/L — ABNORMAL LOW (ref 3.5–5.1)
Sodium: 136 mmol/L (ref 135–145)

## 2020-08-27 MED ORDER — POTASSIUM CHLORIDE CRYS ER 20 MEQ PO TBCR
20.0000 meq | EXTENDED_RELEASE_TABLET | Freq: Every day | ORAL | Status: DC
  Administered 2020-08-27 – 2020-08-31 (×5): 20 meq via ORAL
  Filled 2020-08-27 (×5): qty 1

## 2020-08-27 NOTE — Progress Notes (Signed)
Subjective:  Some back pain  CHF improved BP remains low but stable   Objective:  Vitals:   08/27/20 0100 08/27/20 0334 08/27/20 0355 08/27/20 0730  BP: (!) 89/64 94/76 97/69  91/62  Pulse:  92 87 85  Resp:  18 17 17   Temp:  (!) 97.4 F (36.3 C) 98.4 F (36.9 C) 97.8 F (36.6 C)  TempSrc:  Axillary Oral Oral  SpO2:  96% 97% 98%  Weight:  57.9 kg    Height:        Intake/Output from previous day:  Intake/Output Summary (Last 24 hours) at 08/27/2020 0841 Last data filed at 08/26/2020 1300 Gross per 24 hour  Intake 200 ml  Output --  Net 200 ml    Physical Exam: Elderly black female Decreased BS bases Does not have loud AS murmur  Abdomen soft No edema  Cath site A no hematoma   Lab Results: Basic Metabolic Panel: Recent Labs    08/26/20 0239 08/27/20 0320  NA 135 136  K 3.6 3.4*  CL 103 103  CO2 20* 22  GLUCOSE 82 105*  BUN 15 13  CREATININE 1.01* 0.92  CALCIUM 9.0 9.0   CBC: Recent Labs    08/26/20 0239  WBC 5.9  HGB 12.1  HCT 36.5  MCV 89.2  PLT 216    Imaging: CT CORONARY MORPH W/CTA COR W/SCORE W/CA W/CM &/OR WO/CM  Addendum Date: 08/26/2020   ADDENDUM REPORT: 08/26/2020 08:56 EXAM: OVER-READ INTERPRETATION  CT CHEST The following report is an over-read performed by radiologist Dr. Samara Snide [facility] on 08/26/2020. This over-read does not include interpretation of cardiac or coronary anatomy or pathology. The coronary CTA interpretation by the cardiologist is attached. COMPARISON:  08/21/2020 chest CT angiogram. FINDINGS: Please see the separate concurrent chest CT angiogram report for details. IMPRESSION: Please see the separate concurrent chest CT angiogram report for details. Electronically Signed   By: Ilona Sorrel M.D.   On: 08/26/2020 08:56   Result Date: 08/26/2020 CLINICAL DATA:  Aortic Stenosis EXAM: Cardiac TAVR CT TECHNIQUE: The patient was scanned on a Siemens Force 165 slice scanner. A 120 kV retrospective scan was  triggered in the ascending thoracic aorta at 140 HU's. Gantry rotation speed was 250 msecs and collimation was .6 mm. No beta blockade or nitro were given. The 3D data set was reconstructed in 5% intervals of the R-R cycle. Systolic and diastolic phases were analyzed on a dedicated work station using MPR, MIP and VRT modes. The patient received 80 cc of contrast. FINDINGS: Aortic Valve: Tri leaflet calcified restricted motion Calcium score 2089 Aorta: No aneurysm moderate calcific atherosclerosis Sino-tubular Junction: 23 mm Ascending Thoracic Aorta: 30 mm Descending Thoracic Aorta: 22 mm Sinus of Valsalva Measurements: Non-coronary: 26.3 mm Right - coronary: 27.2 mm Left -   coronary: 28 mm Coronary Artery Height above Annulus: Left Main: 12.7 mm above annulus Right Coronary: 13.5 mm above annulus Virtual Basal Annulus Measurements: Maximum / Minimum Diameter: 24 mm x 20.3 mm Perimeter: 73 mm Area: 400 mm2 Coronary Arteries: Sufficient height above annulus for deployment Optimum Fluoroscopic Angle for Delivery: LAO 5 Caudal 18 degrees IMPRESSION: 1.  Calcified tri leaflet aortic valve with score 2089 2.  Normal aortic root diameter 3.0 cm moderate atherosclerosis 3.  Aortic annulus 400 mm2 suitable for a 23 mm Sapien 3 valve 4. Optimum angiographic angle for deployment LAO 5 Caudal 18 degrees 5.  Coronary arteries sufficient height above annulus for deployment [name]  Electronically Signed: By: [name] M.D. On: 08/25/2020 12:45   CT ANGIO CHEST AORTA W/CM & OR WO/CM  Result Date: 08/26/2020 CLINICAL DATA:  Inpatient.  Aortic stenosis.  TAVR planning. EXAM: CT ANGIOGRAPHY CHEST, ABDOMEN AND PELVIS TECHNIQUE: Multidetector CT imaging through the chest, abdomen and pelvis was performed using the standard protocol during bolus administration of intravenous contrast. Multiplanar reconstructed images and MIPs were obtained and reviewed to evaluate the vascular anatomy. CONTRAST:  131mL OMNIPAQUE IOHEXOL 350  MG/ML SOLN COMPARISON:  08/21/2020 chest CT angiogram. FINDINGS: CTA CHEST FINDINGS Cardiovascular: Borderline mild cardiomegaly. Trace pericardial effusion/thickening. Diffusely thickening coarsely calcified aortic valve. Three-vessel coronary atherosclerosis. Atherosclerotic nonaneurysmal thoracic aorta. Top-normal caliber main pulmonary artery (3.0 cm diameter). No central pulmonary emboli. Mediastinum/Nodes: No discrete thyroid nodules. Unremarkable esophagus. No pathologically enlarged axillary, mediastinal or hilar lymph nodes. Lungs/Pleura: No pneumothorax. Small dependent bilateral pleural effusions. Mild centrilobular emphysema with diffuse bronchial wall thickening. Irregular masslike focus of consolidation in the posterior right lower lobe measures 4.1 x 2.2 cm (series 6/image 52), not appreciably changed from 08/21/2020. Irregular nodular focus of consolidation in the posterior right upper lobe measures 2.1 x 1.5 cm (series 6/image 36), stable in size and slightly more organized since 08/21/2020. No additional significant pulmonary nodules. Musculoskeletal: No aggressive appearing focal osseous lesions. Moderate thoracic spondylosis. CTA ABDOMEN AND PELVIS FINDINGS Hepatobiliary: Normal liver with no liver mass. Small amount of layering hyperdense material within the distended gallbladder, cannot exclude small gallstones. No gallbladder wall thickening or pericholecystic fluid. No biliary ductal dilatation. Pancreas: Normal, with no mass or duct dilation. Spleen: Normal size. No mass. Adrenals/Urinary Tract: Normal adrenals. No hydronephrosis. Simple 2.4 cm anterior upper left renal cyst. No suspicious contour deforming renal lesions. Normal bladder. Stomach/Bowel: Small hiatal hernia. Otherwise normal nondistended stomach. Normal caliber small bowel with no small bowel wall thickening. Normal appendix. Mild left colonic diverticulosis with no large bowel wall thickening or significant pericolonic fat  stranding. Vascular/Lymphatic: Atherosclerotic nonaneurysmal abdominal aorta. No pathologically enlarged lymph nodes in the abdomen or pelvis. Reproductive: Status post hysterectomy, with no abnormal findings at the vaginal cuff. No adnexal mass. Other: No pneumoperitoneum, ascites or focal fluid collection. Musculoskeletal: No aggressive appearing focal osseous lesions. Moderate lumbar spondylosis. VASCULAR MEASUREMENTS PERTINENT TO TAVR: AORTA: Minimal Aortic Diameter-10.3 x 10.0 mm Severity of Aortic Calcification-moderate RIGHT PELVIS: Right Common Iliac Artery - Minimal Diameter-7.3 x 5.3 mm Tortuosity-mild Calcification-severe Right External Iliac Artery - Minimal Diameter-5.2 x 5.0 mm Tortuosity-mild Calcification-mild Right Common Femoral Artery - Minimal Diameter-5.3 x 4.5 mm Tortuosity-mild Calcification-moderate LEFT PELVIS: Left Common Iliac Artery - Minimal Diameter-7.2 x 5.6 mm Tortuosity-mild Calcification-moderate Left External Iliac Artery - Minimal Diameter-5.2 x 4.9 mm Tortuosity-mild Calcification-mild Left Common Femoral Artery - Minimal Diameter-5.1 x 4.4 mm Tortuosity-mild Calcification-moderate Review of the MIP images confirms the above findings. IMPRESSION: 1. Vascular findings and measurements pertinent to potential TAVR procedure, as detailed. 2. Diffuse thickening and coarse calcification of the aortic valve, compatible with the reported history of aortic stenosis. 3. Borderline mild cardiomegaly. Trace pericardial effusion/thickening. Three-vessel coronary atherosclerosis. 4. Small dependent bilateral pleural effusions. 5. Irregular nodular and masslike focus of consolidation in the posterior right lower and posterior right upper lung lobes, stable in size and slightly more organized since 08/21/2020 chest CT, indeterminate for pneumonia versus neoplasm. Short-term follow-up chest CT recommended in 2-3 months, preferably after a trial of antibiotic therapy. 6. Mild centrilobular  emphysema with diffuse bronchial wall thickening, suggesting COPD. 7. Small hiatal hernia. 8. Mild left  colonic diverticulosis. 9. Small amount of layering hyperdense material within the distended gallbladder, cannot exclude small gallstones. 10. Aortic Atherosclerosis (ICD10-I70.0) and Emphysema (ICD10-J43.9). Electronically Signed   By: Ilona Sorrel M.D.   On: 08/26/2020 09:25   CT Angio Abd/Pel w/ and/or w/o  Result Date: 08/26/2020 CLINICAL DATA:  Inpatient.  Aortic stenosis.  TAVR planning. EXAM: CT ANGIOGRAPHY CHEST, ABDOMEN AND PELVIS TECHNIQUE: Multidetector CT imaging through the chest, abdomen and pelvis was performed using the standard protocol during bolus administration of intravenous contrast. Multiplanar reconstructed images and MIPs were obtained and reviewed to evaluate the vascular anatomy. CONTRAST:  166mL OMNIPAQUE IOHEXOL 350 MG/ML SOLN COMPARISON:  08/21/2020 chest CT angiogram. FINDINGS: CTA CHEST FINDINGS Cardiovascular: Borderline mild cardiomegaly. Trace pericardial effusion/thickening. Diffusely thickening coarsely calcified aortic valve. Three-vessel coronary atherosclerosis. Atherosclerotic nonaneurysmal thoracic aorta. Top-normal caliber main pulmonary artery (3.0 cm diameter). No central pulmonary emboli. Mediastinum/Nodes: No discrete thyroid nodules. Unremarkable esophagus. No pathologically enlarged axillary, mediastinal or hilar lymph nodes. Lungs/Pleura: No pneumothorax. Small dependent bilateral pleural effusions. Mild centrilobular emphysema with diffuse bronchial wall thickening. Irregular masslike focus of consolidation in the posterior right lower lobe measures 4.1 x 2.2 cm (series 6/image 52), not appreciably changed from 08/21/2020. Irregular nodular focus of consolidation in the posterior right upper lobe measures 2.1 x 1.5 cm (series 6/image 36), stable in size and slightly more organized since 08/21/2020. No additional significant pulmonary nodules. Musculoskeletal:  No aggressive appearing focal osseous lesions. Moderate thoracic spondylosis. CTA ABDOMEN AND PELVIS FINDINGS Hepatobiliary: Normal liver with no liver mass. Small amount of layering hyperdense material within the distended gallbladder, cannot exclude small gallstones. No gallbladder wall thickening or pericholecystic fluid. No biliary ductal dilatation. Pancreas: Normal, with no mass or duct dilation. Spleen: Normal size. No mass. Adrenals/Urinary Tract: Normal adrenals. No hydronephrosis. Simple 2.4 cm anterior upper left renal cyst. No suspicious contour deforming renal lesions. Normal bladder. Stomach/Bowel: Small hiatal hernia. Otherwise normal nondistended stomach. Normal caliber small bowel with no small bowel wall thickening. Normal appendix. Mild left colonic diverticulosis with no large bowel wall thickening or significant pericolonic fat stranding. Vascular/Lymphatic: Atherosclerotic nonaneurysmal abdominal aorta. No pathologically enlarged lymph nodes in the abdomen or pelvis. Reproductive: Status post hysterectomy, with no abnormal findings at the vaginal cuff. No adnexal mass. Other: No pneumoperitoneum, ascites or focal fluid collection. Musculoskeletal: No aggressive appearing focal osseous lesions. Moderate lumbar spondylosis. VASCULAR MEASUREMENTS PERTINENT TO TAVR: AORTA: Minimal Aortic Diameter-10.3 x 10.0 mm Severity of Aortic Calcification-moderate RIGHT PELVIS: Right Common Iliac Artery - Minimal Diameter-7.3 x 5.3 mm Tortuosity-mild Calcification-severe Right External Iliac Artery - Minimal Diameter-5.2 x 5.0 mm Tortuosity-mild Calcification-mild Right Common Femoral Artery - Minimal Diameter-5.3 x 4.5 mm Tortuosity-mild Calcification-moderate LEFT PELVIS: Left Common Iliac Artery - Minimal Diameter-7.2 x 5.6 mm Tortuosity-mild Calcification-moderate Left External Iliac Artery - Minimal Diameter-5.2 x 4.9 mm Tortuosity-mild Calcification-mild Left Common Femoral Artery - Minimal Diameter-5.1  x 4.4 mm Tortuosity-mild Calcification-moderate Review of the MIP images confirms the above findings. IMPRESSION: 1. Vascular findings and measurements pertinent to potential TAVR procedure, as detailed. 2. Diffuse thickening and coarse calcification of the aortic valve, compatible with the reported history of aortic stenosis. 3. Borderline mild cardiomegaly. Trace pericardial effusion/thickening. Three-vessel coronary atherosclerosis. 4. Small dependent bilateral pleural effusions. 5. Irregular nodular and masslike focus of consolidation in the posterior right lower and posterior right upper lung lobes, stable in size and slightly more organized since 08/21/2020 chest CT, indeterminate for pneumonia versus neoplasm. Short-term follow-up chest CT recommended in 2-3 months,  preferably after a trial of antibiotic therapy. 6. Mild centrilobular emphysema with diffuse bronchial wall thickening, suggesting COPD. 7. Small hiatal hernia. 8. Mild left colonic diverticulosis. 9. Small amount of layering hyperdense material within the distended gallbladder, cannot exclude small gallstones. 10. Aortic Atherosclerosis (ICD10-I70.0) and Emphysema (ICD10-J43.9). Electronically Signed   By: Ilona Sorrel M.D.   On: 08/26/2020 09:25     Cardiac Studies:  ECG: SR LAD chronic lateral ST changes    Telemetry:  NSR 08/27/2020   Echo: EF 25% diffuse hypokinesis worse in septum severe AS   Medications:    aspirin  81 mg Oral Daily   empagliflozin  10 mg Oral Daily   furosemide  40 mg Oral Daily   sodium chloride flush  3 mL Intravenous Q12H   sodium chloride flush  3 mL Intravenous Q12H   sodium chloride flush  3 mL Intravenous Q12H      sodium chloride      Assessment/Plan:    1. CHF/AS:  New diagnosis  No CAD at cath 08/23/20. Would avoid ARB/ACE/ARNI with fixed AS despite low EF. EDP very elevated at cath  38 but mean PCWP 15 and CI 2.1 L/min/m2 Mean gradient 52 mmHg. CT 08/25/20 shows she is a good candidate for a  23 mm Sapien 3 valve. Structural team have discussed with OR and can add her on to next Tuesday schedule for 5 th case TAVR She has been seen by Dr Burt Knack but still needs to be seen by surgeon. Continue daily diuretic BP remains soft  I/O's about even low output no congestion continue daily lasix to keep on dry side    [name] 08/27/2020, 8:41 AM

## 2020-08-27 NOTE — Progress Notes (Signed)
[facility]  PROGRESS NOTE  Alexandria Randolph OBS:[id number] DOB: 1934/10/24 DOA: 08/21/2020 PCP: Marrian Salvage, FNP   Brief HPI:   86 year old female with past medical history of breast cancer currently on letrozole, remote history of tobacco abuse came in 1 to 2 weeks history of shortness of breath on exertion.  2D echo showed EF of 25% and grade 2 diastolic heart failure with severe aortic stenosis with gradient of 40 mmHg.    Subjective   Patient seen and examined, denies chest pain or shortness of breath.   Assessment/Plan:     Severe aortic stenosis -Patient presented with dyspnea on exertion -2D echo showed EF 25%; severe aortic stenosis -Underwent left and right heart catheterization; showed minimal CAD -Aortic valve area 0.39 cm, mean gradient 49 mmHg -Underwent CTA coronary morphology yesterday; deemed a good candidate for TAVR -Cardiology is planning for TAVR on Tuesday   New onset CHF -Both systolic, EF 20 to 47% and diastolic with grade 2 diastolic dysfunction -Likely from underlying severe aortic stenosis -Patient on furosemide 40 mg p.o. daily -Continue aspirin, Farxiga   Hypokalemia -Has persistently low normal potassium due to diuresis from Lasix -Start K-Dur 20 mg p.o. daily -Follow potassium level in a.m.   Elevated troponin -Troponin was elevated 372, 389 -Likely demand ischemia in the setting of new onset CHF due to severe aortic stenosis -Cardiac catheterization showed minimal CAD   Elevated D-dimer -CTA of chest was negative for pulm embolism -Venous duplex  negative for DVT   History of breast cancer -Femara on hold for now   Scheduled medications:    aspirin  81 mg Oral Daily   empagliflozin  10 mg Oral Daily   furosemide  40 mg Oral Daily   potassium chloride  20 mEq Oral Daily   sodium chloride flush  3 mL Intravenous Q12H   sodium chloride flush  3 mL Intravenous Q12H   sodium chloride flush  3 mL Intravenous  Q12H         Data Reviewed:   CBG:  No results for input(s): GLUCAP in the last 168 hours.  SpO2: 100 % O2 Flow Rate (L/min): 2 L/min    Vitals:   08/27/20 0334 08/27/20 0355 08/27/20 0730 08/27/20 1212  BP: 94/76 97/69 91/62  (!) 84/54  Pulse: 92 87 85 87  Resp: 18 17 17 17   Temp: (!) 97.4 F (36.3 C) 98.4 F (36.9 C) 97.8 F (36.6 C) 97.6 F (36.4 C)  TempSrc: Axillary Oral Oral Oral  SpO2: 96% 97% 98% 100%  Weight: 57.9 kg     Height:        No intake or output data in the 24 hours ending 08/27/20 1521   06/08 1901 - 06/10 0700 In: 300 [P.O.:300] Out: 600 [Urine:600]  Filed Weights   08/25/20 0624 08/26/20 0414 08/27/20 0334  Weight: 58.6 kg 57.3 kg 57.9 kg    CBC:  Recent Labs  Lab 08/21/20 0725 08/22/20 0501 08/23/20 0706 08/23/20 1622 08/23/20 1626 08/24/20 0357 08/26/20 0239  WBC 5.9 6.1 5.7  --   --  5.6 5.9  HGB 13.4 13.0 12.2 11.6*  11.6* 10.9* 12.1 12.1  HCT 40.6 41.1 37.0 34.0*  34.0* 32.0* 36.3 36.5  PLT 253 250 236  --   --  192 216  MCV 91.0 93.4 89.2  --   --  88.1 89.2  MCH 30.0 29.5 29.4  --   --  29.4 29.6  MCHC 33.0 31.6 33.0  --   --  33.3 33.2  RDW 12.2 12.3 12.1  --   --  12.0 12.2  LYMPHSABS 0.9  --   --   --   --   --   --   MONOABS 0.6  --   --   --   --   --   --   EOSABS 0.0  --   --   --   --   --   --   BASOSABS 0.1  --   --   --   --   --   --     Complete metabolic panel:  Recent Labs  Lab 08/21/20 0708 08/21/20 0725 08/21/20 0725 08/21/20 6568 08/21/20 1834 08/22/20 0501 08/23/20 1275 08/23/20 1622 08/23/20 1626 08/24/20 0357 08/25/20 0723 08/26/20 0239 08/27/20 0320  NA  --  140   < >  --   --  145 135   < > 140 136 135 135 136  K  --  4.1   < >  --   --  4.7 3.6   < > 3.3* 3.4* 3.9 3.6 3.4*  CL  --  107   < >  --   --  109 105  --   --  105 104 103 103  CO2  --  19*   < >  --   --  25 19*  --   --  20* 19* 20* 22  GLUCOSE  --  148*   < >  --   --  89 102*  --   --  76 96 82 105*  BUN  --   22   < >  --   --  22 17  --   --  17 16 15 13   CREATININE  --  1.19*   < >  --   --  1.01* 1.03*  --   --  0.98 0.98 1.01* 0.92  CALCIUM  --  9.6   < >  --   --  9.5 8.9  --   --  8.8* 9.0 9.0 9.0  AST  --  37  --   --   --   --   --   --   --   --   --   --   --   ALT  --  50*  --   --   --   --   --   --   --   --   --   --   --   ALKPHOS  --  95  --   --   --   --   --   --   --   --   --   --   --   BILITOT  --  1.2  --   --   --   --   --   --   --   --   --   --   --   ALBUMIN  --  4.0  --   --   --   --   --   --   --   --   --   --   --   DDIMER  --   --   --  1.17*  --   --   --   --   --   --   --   --   --   PROCALCITON  --   --   --   --   --  0.13  --   --   --   --   --   --   --   TSH  --   --   --   --  2.383  --   --   --   --   --   --   --   --   HGBA1C  --   --   --   --  5.4  --   --   --   --   --   --   --   --   BNP 2,105.0*  --   --   --   --   --   --   --   --   --   --   --   --    < > = values in this interval not displayed.    No results for input(s): LIPASE, AMYLASE in the last 168 hours.  Recent Labs  Lab 08/21/20 0708 08/21/20 0728 08/21/20 0754 08/22/20 0501  DDIMER  --  1.17*  --   --   BNP 2,105.0*  --   --   --   PROCALCITON  --   --   --  0.13  SARSCOV2NAA  --   --  NEGATIVE  --     ------------------------------------------------------------------------------------------------------------------ No results for input(s): CHOL, HDL, LDLCALC, TRIG, CHOLHDL, LDLDIRECT in the last 72 hours.  Lab Results  Component Value Date   HGBA1C 5.4 08/21/2020   ------------------------------------------------------------------------------------------------------------------ No results for input(s): TSH, T4TOTAL, T3FREE, THYROIDAB in the last 72 hours.  Invalid input(s): FREET3 ------------------------------------------------------------------------------------------------------------------ No results for input(s): VITAMINB12, FOLATE, FERRITIN, TIBC,  IRON, RETICCTPCT in the last 72 hours.  Coagulation profile No results for input(s): INR, PROTIME in the last 168 hours. No results for input(s): DDIMER in the last 72 hours.  Cardiac Enzymes No results for input(s): CKTOTAL, CKMB, CKMBINDEX, TROPONINI in the last 168 hours.  ------------------------------------------------------------------------------------------------------------------    Component Value Date/Time   BNP 2,105.0 (H) 08/21/2020 0708     Antibiotics: Anti-infectives (From admission, onward)    Start     Dose/Rate Route Frequency Ordered Stop   08/22/20 1000  cefdinir (OMNICEF) capsule 300 mg  Status:  Discontinued        300 mg Oral Every 12 hours 08/22/20 0856 08/23/20 0719   08/22/20 1000  azithromycin (ZITHROMAX) tablet 500 mg  Status:  Discontinued        500 mg Oral Daily 08/22/20 0856 08/23/20 0719   08/21/20 1500  cefTRIAXone (ROCEPHIN) 1 g in sodium chloride 0.9 % 100 mL IVPB        1 g 200 mL/hr over 30 Minutes Intravenous  Once 08/21/20 1455 08/21/20 1540   08/21/20 1500  azithromycin (ZITHROMAX) 500 mg in sodium chloride 0.9 % 250 mL IVPB  Status:  Discontinued        500 mg 250 mL/hr over 60 Minutes Intravenous  Once 08/21/20 1455 08/21/20 1611        Radiology Reports  No results found.     DVT prophylaxis: SCDs, start 08/25/2020-we will switch to Lovenox after TAVR  Code Status: DNR  Family Communication: No family at bedside   Consultants: Cardiology  Procedures:     Objective    Physical Examination:  General-appears in no acute distress Heart-S1-S2, regular, no murmur auscultated Lungs-clear to auscultation bilaterally, no wheezing or crackles auscultated Abdomen-soft, nontender, no organomegaly Extremities-no edema in the lower extremities Neuro-alert, oriented x3, no focal  deficit noted   Status is: Inpatient  Dispo: The patient is from: Home              Anticipated d/c is to: Home              Anticipated  d/c date is: 09/01/2020              Patient currently not stable for discharge  Barrier to discharge-plan for TAVR as per cardiology  COVID-19 Labs  No results for input(s): DDIMER, FERRITIN, LDH, CRP in the last 72 hours.  Lab Results  Component Value Date   [place] NEGATIVE 08/21/2020    Microbiology  Recent Results (from the past 240 hour(s))  Resp Panel by RT-PCR (Flu A&B, Covid) Nasopharyngeal Swab     Status: None   Collection Time: 08/21/20  7:54 AM   Specimen: Nasopharyngeal Swab; Nasopharyngeal(NP) swabs in vial transport medium  Result Value Ref Range Status   SARS Coronavirus 2 by RT PCR NEGATIVE NEGATIVE Final    Comment: (NOTE) SARS-CoV-2 target nucleic acids are NOT DETECTED.  The SARS-CoV-2 RNA is generally detectable in upper respiratory specimens during the acute phase of infection. The lowest concentration of SARS-CoV-2 viral copies this assay can detect is 138 copies/mL. A negative result does not preclude SARS-Cov-2 infection and should not be used as the sole basis for treatment or other patient management decisions. A negative result may occur with  improper specimen collection/handling, submission of specimen other than nasopharyngeal swab, presence of viral mutation(s) within the areas targeted by this assay, and inadequate number of viral copies(<138 copies/mL). A negative result must be combined with clinical observations, patient history, and epidemiological information. The expected result is Negative.  Fact Sheet for Patients:  EntrepreneurPulse.com.au  Fact Sheet for Healthcare Providers:  IncredibleEmployment.be  This test is no t yet approved or cleared by the [place] FDA and  has been authorized for detection and/or diagnosis of SARS-CoV-2 by FDA under an Emergency Use Authorization (EUA). This EUA will remain  in effect (meaning this test can be used) for the duration of the COVID-19  declaration under Section 564(b)(1) of the Act, 21 U.S.C.section 360bbb-3(b)(1), unless the authorization is terminated  or revoked sooner.       Influenza A by PCR NEGATIVE NEGATIVE Final   Influenza B by PCR NEGATIVE NEGATIVE Final    Comment: (NOTE) The Xpert Xpress SARS-CoV-2/FLU/RSV plus assay is intended as an aid in the diagnosis of influenza from Nasopharyngeal swab specimens and should not be used as a sole basis for treatment. Nasal washings and aspirates are unacceptable for Xpert Xpress SARS-CoV-2/FLU/RSV testing.  Fact Sheet for Patients: EntrepreneurPulse.com.au  Fact Sheet for Healthcare Providers: IncredibleEmployment.be  This test is not yet approved or cleared by the [place] FDA and has been authorized for detection and/or diagnosis of SARS-CoV-2 by FDA under an Emergency Use Authorization (EUA). This EUA will remain in effect (meaning this test can be used) for the duration of the COVID-19 declaration under Section 564(b)(1) of the Act, 21 U.S.C. section 360bbb-3(b)(1), unless the authorization is terminated or revoked.  Performed at [facility], [place] [address] [place], [place] [address]              Oswald Hillock   [facility] If 7PM-7AM, please contact night-coverage at [email or web address], Office  [phone]   08/27/2020, 3:21 PM  LOS: 6 days

## 2020-08-27 NOTE — Progress Notes (Addendum)
Heart Failure Stewardship Pharmacist Progress Note   PCP: Marrian Salvage, FNP PCP-Cardiologist: Donato Heinz, MD    HPI:  86 yo F w/ PMH of HTN, HLD, remote light tobacco use, and breast cancer on letrozole who presented to [facility] ED 6/4 complaining of severe worsening SOB. CXR showed no edema, CT revealed consolidation in right lower lobe. Diuresed w/ IV Lasix 40mg  and out 1L. BNP 2105. ECHO 6/5 revealed EF 20-25% and severe AS. Elevated troponin (380) and underwent cath 6/6 revealing minimal CAD with severe AS. RA 8/6, PAP 56/15, PCWP 15, LVEDP 38, CI 2.1. Notes allergy to losartan and olmesartan w/ SOB.   Current HF Medications: - Lasix 40mg  PO daily - Jardiance 10mg  daily - Potassium 26mEq daily  Prior to admission HF Medications: - None  Pertinent Lab Values: Serum creatinine 0.92, BUN 13, Potassium 3.4, Sodium 136, BNP 2105  Vital Signs: Weight: 57.9kg (admission weight: 56.7kg) Blood pressure: 80-90s/60s Heart rate: 80s  Medication Assistance / Insurance Benefits Check: Does the patient have prescription insurance?  Yes Type of insurance plan: [employer]  Does the patient qualify for medication assistance through manufacturers or grants?   Yes - can utilize copay cards   Outpatient Pharmacy:  [facility] to admission [facility] Is the patient willing to use [place] pharmacy at discharge? Yes Is the patient willing to transition their outpatient pharmacy to utilize a [facility] outpatient pharmacy?   Pending    Assessment: 1. New onset systolic CHF (EF 09-81%), due to NICM. NYHA class III-IV. - No edema on exam - Desires to stay in hospital until TAVR, scheduled for 6/14 - Low BP limits options at this time - Continue furosemide 40 mg PO daily to keep on dry side - Continue Jardiance 10 mg daily - Potassium slightly low, replenish w/ 13mEq   Plan: 1) Medication changes recommended at this time: - Continue current  regimen  2) Patient assistance: - Jardiance PA approved through 08/24/2021, co-pay will be $27.32/mo - Co-pay card will decrease cost to $10/1-64mo  3)  Education  - Patient has been educated on current HF medications (Lasix and Jardiance). - Patient verbalizes understanding that over the next few months, these medication doses may change and more medications may be added to optimize HF regimen - Patient has been educated on basic disease state pathophysiology and goals of therapy - Time spent (44min) - HF TOC appt rescheduled for 6/21 given TAVR next week   Alexandria Randolph, PharmD Student

## 2020-08-28 LAB — BASIC METABOLIC PANEL
Anion gap: 11 (ref 5–15)
BUN: 13 mg/dL (ref 8–23)
CO2: 20 mmol/L — ABNORMAL LOW (ref 22–32)
Calcium: 8.9 mg/dL (ref 8.9–10.3)
Chloride: 107 mmol/L (ref 98–111)
Creatinine, Ser: 0.95 mg/dL (ref 0.44–1.00)
GFR, Estimated: 59 mL/min — ABNORMAL LOW (ref >60)
Glucose, Bld: 107 mg/dL — ABNORMAL HIGH (ref 70–99)
Potassium: 3.9 mmol/L (ref 3.5–5.1)
Sodium: 138 mmol/L (ref 135–145)

## 2020-08-28 NOTE — Progress Notes (Signed)
    Subjective:  Having a good morning  No dyspnea Boyfriend Alexandria Randolph already with her this am   Objective:  Vitals:   08/27/20 1932 08/28/20 0006 08/28/20 0400 08/28/20 0412  BP: (!) 86/70 (!) 81/58 (!) 89/67   Pulse: 99 95 84   Resp: 19 14 16    Temp: 97.9 F (36.6 C) 97.7 F (36.5 C) 98 F (36.7 C)   TempSrc: Oral Oral Oral   SpO2: 100% 99% 97%   Weight:    58 kg  Height:        Intake/Output from previous day:  Intake/Output Summary (Last 24 hours) at 08/28/2020 0843 Last data filed at 08/27/2020 1933 Gross per 24 hour  Intake 200 ml  Output 800 ml  Net -600 ml    Physical Exam: Elderly black female Decreased BS bases Does not have loud AS murmur  Abdomen soft No edema  Cath site A no hematoma   Lab Results: Basic Metabolic Panel: Recent Labs    08/27/20 0320 08/28/20 0304  NA 136 138  K 3.4* 3.9  CL 103 107  CO2 22 20*  GLUCOSE 105* 107*  BUN 13 13  CREATININE 0.92 0.95  CALCIUM 9.0 8.9   CBC: Recent Labs    08/26/20 0239  WBC 5.9  HGB 12.1  HCT 36.5  MCV 89.2  PLT 216    Imaging: No results found.   Cardiac Studies:  ECG: SR LAD chronic lateral ST changes    Telemetry:  NSR 08/28/2020   Echo: EF 25% diffuse hypokinesis worse in septum severe AS   Medications:    aspirin  81 mg Oral Daily   empagliflozin  10 mg Oral Daily   furosemide  40 mg Oral Daily   potassium chloride  20 mEq Oral Daily   sodium chloride flush  3 mL Intravenous Q12H   sodium chloride flush  3 mL Intravenous Q12H   sodium chloride flush  3 mL Intravenous Q12H      sodium chloride      Assessment/Plan:    1. CHF/AS:  New diagnosis  No CAD at cath 08/23/20. Would avoid ARB/ACE/ARNI with fixed AS despite low EF. EDP very elevated at cath  38 but mean PCWP 15 and CI 2.1 L/min/m2 Mean gradient 52 mmHg. CT 08/25/20 shows she is a good candidate for a 23 mm Sapien 3 valve. Structural team have discussed with OR and can add her on to next Tuesday schedule for 5 th  case TAVR She has been seen by Dr Alexandria Randolph but still needs to be seen by surgeon. Continue daily diuretic BP remains soft  I/O's about even low output no congestion continue daily lasix to keep on dry side    Alexandria Randolph 08/28/2020, 8:43 AM    Patient ID: Alexandria Randolph, female   DOB: 1934-05-17, 86 y.o.   MRN: [id number]

## 2020-08-28 NOTE — Progress Notes (Signed)
[facility]  PROGRESS NOTE  Alexandria Randolph:[id number] DOB: Oct 12, 1934 DOA: 08/21/2020 PCP: Marrian Salvage, FNP   Brief HPI:   86 year old female with past medical history of breast cancer currently on letrozole, remote history of tobacco abuse came in 1 to 2 weeks history of shortness of breath on exertion.  2D echo showed EF of 25% and grade 2 diastolic heart failure with severe aortic stenosis with gradient of 40 mmHg.    Subjective   Patient seen and examined, denies chest pain or shortness of breath.  BP has been soft.  Patient is on Lasix 40 mg daily.   Assessment/Plan:     Severe aortic stenosis -Patient presented with dyspnea on exertion -2D echo showed EF 25%; severe aortic stenosis -Underwent left and right heart catheterization; showed minimal CAD -Aortic valve area 0.39 cm, mean gradient 49 mmHg -Underwent CTA coronary morphology yesterday; deemed a good candidate for TAVR -Cardiology is planning for TAVR on Tuesday   New onset CHF -Both systolic, EF 20 to 14% and diastolic with grade 2 diastolic dysfunction -Likely from underlying severe aortic stenosis -Patient on furosemide 40 mg p.o. daily; cardiology recommended to continue Lasix despite soft BP -Continue aspirin, Farxiga   Hypokalemia -Has persistently low normal potassium due to diuresis from Lasix -Started on K-Dur 20 mg p.o. daily -Follow BMP in am    Elevated troponin -Troponin was elevated 372, 389 -Likely demand ischemia in the setting of new onset CHF due to severe aortic stenosis -Cardiac catheterization showed minimal CAD   Elevated D-dimer -CTA of chest was negative for pulm embolism -Venous duplex  negative for DVT   History of breast cancer -Femara on hold for now   Scheduled medications:    aspirin  81 mg Oral Daily   empagliflozin  10 mg Oral Daily   furosemide  40 mg Oral Daily   potassium chloride  20 mEq Oral Daily   sodium chloride flush  3 mL  Intravenous Q12H   sodium chloride flush  3 mL Intravenous Q12H   sodium chloride flush  3 mL Intravenous Q12H         Data Reviewed:   CBG:  No results for input(s): GLUCAP in the last 168 hours.  SpO2: 97 % O2 Flow Rate (L/min): 2 L/min    Vitals:   08/27/20 1932 08/28/20 0006 08/28/20 0400 08/28/20 0412  BP: (!) 86/70 (!) 81/58 (!) 89/67   Pulse: 99 95 84   Resp: 19 14 16    Temp: 97.9 F (36.6 C) 97.7 F (36.5 C) 98 F (36.7 C)   TempSrc: Oral Oral Oral   SpO2: 100% 99% 97%   Weight:    58 kg  Height:         Intake/Output Summary (Last 24 hours) at 08/28/2020 1122 Last data filed at 08/27/2020 1933 Gross per 24 hour  Intake 200 ml  Output 800 ml  Net -600 ml     06/09 1901 - 06/11 0700 In: 200 [P.O.:200] Out: 800 [Urine:800]  Filed Weights   08/26/20 0414 08/27/20 0334 08/28/20 0412  Weight: 57.3 kg 57.9 kg 58 kg    CBC:  Recent Labs  Lab 08/22/20 0501 08/23/20 0706 08/23/20 1622 08/23/20 1626 08/24/20 0357 08/26/20 0239  WBC 6.1 5.7  --   --  5.6 5.9  HGB 13.0 12.2 11.6*  11.6* 10.9* 12.1 12.1  HCT 41.1 37.0 34.0*  34.0* 32.0* 36.3 36.5  PLT 250 236  --   --  192 216  MCV 93.4 89.2  --   --  88.1 89.2  MCH 29.5 29.4  --   --  29.4 29.6  MCHC 31.6 33.0  --   --  33.3 33.2  RDW 12.3 12.1  --   --  12.0 12.2    Complete metabolic panel:  Recent Labs  Lab 08/21/20 1834 08/22/20 0501 08/23/20 0706 08/24/20 0357 08/25/20 0723 08/26/20 0239 08/27/20 0320 08/28/20 0304  NA  --  145   < > 136 135 135 136 138  K  --  4.7   < > 3.4* 3.9 3.6 3.4* 3.9  CL  --  109   < > 105 104 103 103 107  CO2  --  25   < > 20* 19* 20* 22 20*  GLUCOSE  --  89   < > 76 96 82 105* 107*  BUN  --  22   < > 17 16 15 13 13   CREATININE  --  1.01*   < > 0.98 0.98 1.01* 0.92 0.95  CALCIUM  --  9.5   < > 8.8* 9.0 9.0 9.0 8.9  PROCALCITON  --  0.13  --   --   --   --   --   --   TSH 2.383  --   --   --   --   --   --   --   HGBA1C 5.4  --   --   --   --    --   --   --    < > = values in this interval not displayed.    No results for input(s): LIPASE, AMYLASE in the last 168 hours.  Recent Labs  Lab 08/22/20 0501  PROCALCITON 0.13    ------------------------------------------------------------------------------------------------------------------ No results for input(s): CHOL, HDL, LDLCALC, TRIG, CHOLHDL, LDLDIRECT in the last 72 hours.  Lab Results  Component Value Date   HGBA1C 5.4 08/21/2020   ------------------------------------------------------------------------------------------------------------------ No results for input(s): TSH, T4TOTAL, T3FREE, THYROIDAB in the last 72 hours.  Invalid input(s): FREET3 ------------------------------------------------------------------------------------------------------------------ No results for input(s): VITAMINB12, FOLATE, FERRITIN, TIBC, IRON, RETICCTPCT in the last 72 hours.  Coagulation profile No results for input(s): INR, PROTIME in the last 168 hours. No results for input(s): DDIMER in the last 72 hours.  Cardiac Enzymes No results for input(s): CKTOTAL, CKMB, CKMBINDEX, TROPONINI in the last 168 hours.  ------------------------------------------------------------------------------------------------------------------    Component Value Date/Time   BNP 2,105.0 (H) 08/21/2020 0708     Antibiotics: Anti-infectives (From admission, onward)    Start     Dose/Rate Route Frequency Ordered Stop   08/22/20 1000  cefdinir (OMNICEF) capsule 300 mg  Status:  Discontinued        300 mg Oral Every 12 hours 08/22/20 0856 08/23/20 0719   08/22/20 1000  azithromycin (ZITHROMAX) tablet 500 mg  Status:  Discontinued        500 mg Oral Daily 08/22/20 0856 08/23/20 0719   08/21/20 1500  cefTRIAXone (ROCEPHIN) 1 g in sodium chloride 0.9 % 100 mL IVPB        1 g 200 mL/hr over 30 Minutes Intravenous  Once 08/21/20 1455 08/21/20 1540   08/21/20 1500  azithromycin (ZITHROMAX) 500 mg in  sodium chloride 0.9 % 250 mL IVPB  Status:  Discontinued        500 mg 250 mL/hr over 60 Minutes Intravenous  Once 08/21/20 1455 08/21/20 1611        Radiology Reports  No results found.     DVT prophylaxis: SCDs, start 08/25/2020-we will switch to Lovenox after TAVR  Code Status: DNR  Family Communication: No family at bedside   Consultants: Cardiology  Procedures:     Objective    Physical Examination:  General-appears in no acute distress Heart-S1-S2, regular, no murmur auscultated Lungs-clear to auscultation bilaterally, no wheezing or crackles auscultated Abdomen-soft, nontender, no organomegaly Extremities-no edema in the lower extremities Neuro-alert, oriented x3, no focal deficit noted   Status is: Inpatient  Dispo: The patient is from: Home              Anticipated d/c is to: Home              Anticipated d/c date is: 09/01/2020              Patient currently not stable for discharge  Barrier to discharge-plan for TAVR as per cardiology  COVID-19 Labs  No results for input(s): DDIMER, FERRITIN, LDH, CRP in the last 72 hours.  Lab Results  Component Value Date   [place] NEGATIVE 08/21/2020    Microbiology  Recent Results (from the past 240 hour(s))  Resp Panel by RT-PCR (Flu A&B, Covid) Nasopharyngeal Swab     Status: None   Collection Time: 08/21/20  7:54 AM   Specimen: Nasopharyngeal Swab; Nasopharyngeal(NP) swabs in vial transport medium  Result Value Ref Range Status   SARS Coronavirus 2 by RT PCR NEGATIVE NEGATIVE Final    Comment: (NOTE) SARS-CoV-2 target nucleic acids are NOT DETECTED.  The SARS-CoV-2 RNA is generally detectable in upper respiratory specimens during the acute phase of infection. The lowest concentration of SARS-CoV-2 viral copies this assay can detect is 138 copies/mL. A negative result does not preclude SARS-Cov-2 infection and should not be used as the sole basis for treatment or other patient management  decisions. A negative result may occur with  improper specimen collection/handling, submission of specimen other than nasopharyngeal swab, presence of viral mutation(s) within the areas targeted by this assay, and inadequate number of viral copies(<138 copies/mL). A negative result must be combined with clinical observations, patient history, and epidemiological information. The expected result is Negative.  Fact Sheet for Patients:  EntrepreneurPulse.com.au  Fact Sheet for Healthcare Providers:  IncredibleEmployment.be  This test is no t yet approved or cleared by the [place] FDA and  has been authorized for detection and/or diagnosis of SARS-CoV-2 by FDA under an Emergency Use Authorization (EUA). This EUA will remain  in effect (meaning this test can be used) for the duration of the COVID-19 declaration under Section 564(b)(1) of the Act, 21 U.S.C.section 360bbb-3(b)(1), unless the authorization is terminated  or revoked sooner.       Influenza A by PCR NEGATIVE NEGATIVE Final   Influenza B by PCR NEGATIVE NEGATIVE Final    Comment: (NOTE) The Xpert Xpress SARS-CoV-2/FLU/RSV plus assay is intended as an aid in the diagnosis of influenza from Nasopharyngeal swab specimens and should not be used as a sole basis for treatment. Nasal washings and aspirates are unacceptable for Xpert Xpress SARS-CoV-2/FLU/RSV testing.  Fact Sheet for Patients: EntrepreneurPulse.com.au  Fact Sheet for Healthcare Providers: IncredibleEmployment.be  This test is not yet approved or cleared by the [place] FDA and has been authorized for detection and/or diagnosis of SARS-CoV-2 by FDA under an Emergency Use Authorization (EUA). This EUA will remain in effect (meaning this test can be used) for the duration of the COVID-19 declaration under Section 564(b)(1) of the Act, 21  U.S.C. section 360bbb-3(b)(1), unless the  authorization is terminated or revoked.  Performed at [facility], [place] [address], [place], [place] [address]              Oswald Hillock   [facility] If 7PM-7AM, please contact night-coverage at [email or web address], Office  [phone]   08/28/2020, 11:22 AM  LOS: 7 days

## 2020-08-29 NOTE — Progress Notes (Signed)
    Subjective:  Dyspnea improve BP low chronic no dizziness  Objective:  Vitals:   08/28/20 1929 08/29/20 0107 08/29/20 0400 08/29/20 0436  BP: (!) 88/60 91/66  96/69  Pulse: 97     Resp: 19     Temp: 97.6 F (36.4 C) 98.3 F (36.8 C)  97.9 F (36.6 C)  TempSrc: Oral Oral  Oral  SpO2: 100%     Weight:   60.1 kg   Height:        Intake/Output from previous day:  Intake/Output Summary (Last 24 hours) at 08/29/2020 1040 Last data filed at 08/29/2020 0200 Gross per 24 hour  Intake 220 ml  Output 550 ml  Net -330 ml    Physical Exam: Elderly black female Decreased BS bases Does not have loud AS murmur  Abdomen soft No edema  Cath site A no hematoma   Lab Results: Basic Metabolic Panel: Recent Labs    08/27/20 0320 08/28/20 0304  NA 136 138  K 3.4* 3.9  CL 103 107  CO2 22 20*  GLUCOSE 105* 107*  BUN 13 13  CREATININE 0.92 0.95  CALCIUM 9.0 8.9   CBC: No results for input(s): WBC, NEUTROABS, HGB, HCT, MCV, PLT in the last 72 hours.   Imaging: No results found.   Cardiac Studies:  ECG: SR LAD chronic lateral ST changes    Telemetry:  NSR 08/29/2020   Echo: EF 25% diffuse hypokinesis worse in septum severe AS   Medications:    aspirin  81 mg Oral Daily   empagliflozin  10 mg Oral Daily   furosemide  40 mg Oral Daily   potassium chloride  20 mEq Oral Daily   sodium chloride flush  3 mL Intravenous Q12H   sodium chloride flush  3 mL Intravenous Q12H   sodium chloride flush  3 mL Intravenous Q12H      sodium chloride      Assessment/Plan:    1. CHF/AS:  New diagnosis  No CAD at cath 08/23/20. Would avoid ARB/ACE/ARNI with fixed AS despite low EF. EDP very elevated at cath  38 but mean PCWP 15 and CI 2.1 L/min/m2 Mean gradient 52 mmHg. CT 08/25/20 shows she is a good candidate for a 23 mm Sapien 3 valve. Structural team have discussed with OR and can add her on to next Tuesday schedule for 5 th case TAVR She has been seen by Dr Burt Knack but still needs  to be seen by surgeon. Continue daily diuretic BP remains soft  I/O's about even low output no congestion continue daily lasix to keep on dry side    [name] 08/29/2020, 10:40 AM

## 2020-08-29 NOTE — Plan of Care (Signed)
  Problem: Nutrition: Goal: Adequate nutrition will be maintained Outcome: Completed/Met   Problem: Coping: Goal: Level of anxiety will decrease Outcome: Completed/Met   Problem: Pain Managment: Goal: General experience of comfort will improve Outcome: Completed/Met   

## 2020-08-29 NOTE — Progress Notes (Signed)
[facility]  PROGRESS NOTE  Alexandria Randolph OEU:[id number] DOB: [birth year] DOA: 08/21/2020 PCP: Marrian Salvage, FNP   Brief HPI:   86 year old female with past medical history of breast cancer currently on letrozole, remote history of tobacco abuse came in 1 to 2 weeks history of shortness of breath on exertion.  2D echo showed EF of 25% and grade 2 diastolic heart failure with severe aortic stenosis with gradient of 40 mmHg.    Subjective   Patient seen and examined, denies any complaints.  Awaiting for TAVR on Tuesday.   Assessment/Plan:     Severe aortic stenosis -Patient presented with dyspnea on exertion -2D echo showed EF 25%; severe aortic stenosis -Underwent left and right heart catheterization; showed minimal CAD -Aortic valve area 0.39 cm, mean gradient 49 mmHg -Underwent CTA coronary morphology yesterday; deemed a good candidate for TAVR -Cardiology is planning for TAVR on Tuesday   New onset CHF -Both systolic, EF 20 to 15% and diastolic with grade 2 diastolic dysfunction -Likely from underlying severe aortic stenosis -Patient on furosemide 40 mg p.o. daily; cardiology recommended to continue Lasix despite soft BP -Continue aspirin, Farxiga   Hypokalemia -Has persistently low normal potassium due to diuresis from Lasix -Started on K-Dur 20 mg p.o. daily -Today potassium is 3.9   Elevated troponin -Troponin was elevated 372, 389 -Likely demand ischemia in the setting of new onset CHF due to severe aortic stenosis -Cardiac catheterization showed minimal CAD   Elevated D-dimer -CTA of chest was negative for pulm embolism -Venous duplex  negative for DVT   History of breast cancer -Femara on hold for now   Scheduled medications:    aspirin  81 mg Oral Daily   empagliflozin  10 mg Oral Daily   furosemide  40 mg Oral Daily   potassium chloride  20 mEq Oral Daily   sodium chloride flush  3 mL Intravenous Q12H   sodium chloride flush  3  mL Intravenous Q12H   sodium chloride flush  3 mL Intravenous Q12H         Data Reviewed:   CBG:  No results for input(s): GLUCAP in the last 168 hours.  SpO2: 100 % O2 Flow Rate (L/min): 2 L/min    Vitals:   08/29/20 0107 08/29/20 0400 08/29/20 0436 08/29/20 0900  BP: 91/66  96/69 98/80  Pulse:    83  Resp:    16  Temp: 98.3 F (36.8 C)  97.9 F (36.6 C) 97.8 F (36.6 C)  TempSrc: Oral  Oral Oral  SpO2:    100%  Weight:  60.1 kg    Height:         Intake/Output Summary (Last 24 hours) at 08/29/2020 1102 Last data filed at 08/29/2020 0900 Gross per 24 hour  Intake 340 ml  Output 550 ml  Net -210 ml     06/10 1901 - 06/12 0700 In: 220 [P.O.:220] Out: 750 [Urine:600]  Filed Weights   08/27/20 0334 08/28/20 0412 08/29/20 0400  Weight: 57.9 kg 58 kg 60.1 kg    CBC:  Recent Labs  Lab 08/23/20 0706 08/23/20 1622 08/23/20 1626 08/24/20 0357 08/26/20 0239  WBC 5.7  --   --  5.6 5.9  HGB 12.2 11.6*  11.6* 10.9* 12.1 12.1  HCT 37.0 34.0*  34.0* 32.0* 36.3 36.5  PLT 236  --   --  192 216  MCV 89.2  --   --  88.1 89.2  MCH 29.4  --   --  29.4 29.6  MCHC 33.0  --   --  33.3 33.2  RDW 12.1  --   --  12.0 12.2    Complete metabolic panel:  Recent Labs  Lab 08/24/20 0357 08/25/20 0723 08/26/20 0239 08/27/20 0320 08/28/20 0304  NA 136 135 135 136 138  K 3.4* 3.9 3.6 3.4* 3.9  CL 105 104 103 103 107  CO2 20* 19* 20* 22 20*  GLUCOSE 76 96 82 105* 107*  BUN 17 16 15 13 13   CREATININE 0.98 0.98 1.01* 0.92 0.95  CALCIUM 8.8* 9.0 9.0 9.0 8.9    No results for input(s): LIPASE, AMYLASE in the last 168 hours.  No results for input(s): CRP, DDIMER, BNP, PROCALCITON, SARSCOV2NAA in the last 168 hours.  Invalid input(s): LACTICACID   ------------------------------------------------------------------------------------------------------------------ No results for input(s): CHOL, HDL, LDLCALC, TRIG, CHOLHDL, LDLDIRECT in the last 72 hours.  Lab  Results  Component Value Date   HGBA1C 5.4 08/21/2020   ------------------------------------------------------------------------------------------------------------------ No results for input(s): TSH, T4TOTAL, T3FREE, THYROIDAB in the last 72 hours.  Invalid input(s): FREET3 ------------------------------------------------------------------------------------------------------------------ No results for input(s): VITAMINB12, FOLATE, FERRITIN, TIBC, IRON, RETICCTPCT in the last 72 hours.  Coagulation profile No results for input(s): INR, PROTIME in the last 168 hours. No results for input(s): DDIMER in the last 72 hours.  Cardiac Enzymes No results for input(s): CKTOTAL, CKMB, CKMBINDEX, TROPONINI in the last 168 hours.  ------------------------------------------------------------------------------------------------------------------    Component Value Date/Time   BNP 2,105.0 (H) 08/21/2020 0708     Antibiotics: Anti-infectives (From admission, onward)    Start     Dose/Rate Route Frequency Ordered Stop   08/22/20 1000  cefdinir (OMNICEF) capsule 300 mg  Status:  Discontinued        300 mg Oral Every 12 hours 08/22/20 0856 08/23/20 0719   08/22/20 1000  azithromycin (ZITHROMAX) tablet 500 mg  Status:  Discontinued        500 mg Oral Daily 08/22/20 0856 08/23/20 0719   08/21/20 1500  cefTRIAXone (ROCEPHIN) 1 g in sodium chloride 0.9 % 100 mL IVPB        1 g 200 mL/hr over 30 Minutes Intravenous  Once 08/21/20 1455 08/21/20 1540   08/21/20 1500  azithromycin (ZITHROMAX) 500 mg in sodium chloride 0.9 % 250 mL IVPB  Status:  Discontinued        500 mg 250 mL/hr over 60 Minutes Intravenous  Once 08/21/20 1455 08/21/20 1611        Radiology Reports  No results found.     DVT prophylaxis: SCDs, start 08/25/2020-we will switch to Lovenox after TAVR  Code Status: DNR  Family Communication: No family at bedside   Consultants: Cardiology  Procedures:     Objective     Physical Examination:  General-appears in no acute distress Heart-S1-S2, regular, no murmur auscultated Lungs-clear to auscultation bilaterally, no wheezing or crackles auscultated Abdomen-soft, nontender, no organomegaly Extremities-no edema in the lower extremities Neuro-alert, oriented x3, no focal deficit noted   Status is: Inpatient  Dispo: The patient is from: Home              Anticipated d/c is to: Home              Anticipated d/c date is: 09/01/2020              Patient currently not stable for discharge  Barrier to discharge-plan for TAVR as per cardiology  COVID-19 Labs  No results for input(s): DDIMER, FERRITIN, LDH, CRP in  the last 72 hours.  Lab Results  Component Value Date   [place] NEGATIVE 08/21/2020    Microbiology  Recent Results (from the past 240 hour(s))  Resp Panel by RT-PCR (Flu A&B, Covid) Nasopharyngeal Swab     Status: None   Collection Time: 08/21/20  7:54 AM   Specimen: Nasopharyngeal Swab; Nasopharyngeal(NP) swabs in vial transport medium  Result Value Ref Range Status   SARS Coronavirus 2 by RT PCR NEGATIVE NEGATIVE Final    Comment: (NOTE) SARS-CoV-2 target nucleic acids are NOT DETECTED.  The SARS-CoV-2 RNA is generally detectable in upper respiratory specimens during the acute phase of infection. The lowest concentration of SARS-CoV-2 viral copies this assay can detect is 138 copies/mL. A negative result does not preclude SARS-Cov-2 infection and should not be used as the sole basis for treatment or other patient management decisions. A negative result may occur with  improper specimen collection/handling, submission of specimen other than nasopharyngeal swab, presence of viral mutation(s) within the areas targeted by this assay, and inadequate number of viral copies(<138 copies/mL). A negative result must be combined with clinical observations, patient history, and epidemiological information. The expected result is  Negative.  Fact Sheet for Patients:  EntrepreneurPulse.com.au  Fact Sheet for Healthcare Providers:  IncredibleEmployment.be  This test is no t yet approved or cleared by the [place] FDA and  has been authorized for detection and/or diagnosis of SARS-CoV-2 by FDA under an Emergency Use Authorization (EUA). This EUA will remain  in effect (meaning this test can be used) for the duration of the COVID-19 declaration under Section 564(b)(1) of the Act, 21 U.S.C.section 360bbb-3(b)(1), unless the authorization is terminated  or revoked sooner.       Influenza A by PCR NEGATIVE NEGATIVE Final   Influenza B by PCR NEGATIVE NEGATIVE Final    Comment: (NOTE) The Xpert Xpress SARS-CoV-2/FLU/RSV plus assay is intended as an aid in the diagnosis of influenza from Nasopharyngeal swab specimens and should not be used as a sole basis for treatment. Nasal washings and aspirates are unacceptable for Xpert Xpress SARS-CoV-2/FLU/RSV testing.  Fact Sheet for Patients: EntrepreneurPulse.com.au  Fact Sheet for Healthcare Providers: IncredibleEmployment.be  This test is not yet approved or cleared by the [place] FDA and has been authorized for detection and/or diagnosis of SARS-CoV-2 by FDA under an Emergency Use Authorization (EUA). This EUA will remain in effect (meaning this test can be used) for the duration of the COVID-19 declaration under Section 564(b)(1) of the Act, 21 U.S.C. section 360bbb-3(b)(1), unless the authorization is terminated or revoked.  Performed at [facility], [place] [address], [place],  [address]              Oswald Hillock   [facility] If 7PM-7AM, please contact night-coverage at [email or web address], Office  [phone]   08/29/2020, 11:02 AM  LOS: 8 days

## 2020-08-30 ENCOUNTER — Inpatient Hospital Stay (HOSPITAL_COMMUNITY): Payer: Medicare Other

## 2020-08-30 DIAGNOSIS — E785 Hyperlipidemia, unspecified: Secondary | ICD-10-CM

## 2020-08-30 DIAGNOSIS — I110 Hypertensive heart disease with heart failure: Secondary | ICD-10-CM

## 2020-08-30 DIAGNOSIS — I700 Atherosclerosis of aorta: Secondary | ICD-10-CM

## 2020-08-30 DIAGNOSIS — J432 Centrilobular emphysema: Secondary | ICD-10-CM

## 2020-08-30 DIAGNOSIS — I5043 Acute on chronic combined systolic (congestive) and diastolic (congestive) heart failure: Secondary | ICD-10-CM

## 2020-08-30 DIAGNOSIS — I350 Nonrheumatic aortic (valve) stenosis: Secondary | ICD-10-CM

## 2020-08-30 DIAGNOSIS — J90 Pleural effusion, not elsewhere classified: Secondary | ICD-10-CM

## 2020-08-30 LAB — BASIC METABOLIC PANEL
Anion gap: 11 (ref 5–15)
BUN: 18 mg/dL (ref 8–23)
CO2: 17 mmol/L — ABNORMAL LOW (ref 22–32)
Calcium: 9.2 mg/dL (ref 8.9–10.3)
Chloride: 109 mmol/L (ref 98–111)
Creatinine, Ser: 1.12 mg/dL — ABNORMAL HIGH (ref 0.44–1.00)
GFR, Estimated: 48 mL/min — ABNORMAL LOW (ref >60)
Glucose, Bld: 141 mg/dL — ABNORMAL HIGH (ref 70–99)
Potassium: 3.8 mmol/L (ref 3.5–5.1)
Sodium: 137 mmol/L (ref 135–145)

## 2020-08-30 LAB — BLOOD GAS, ARTERIAL
Acid-base deficit: 3.9 mmol/L — ABNORMAL HIGH (ref 0.0–2.0)
Bicarbonate: 19.2 mmol/L — ABNORMAL LOW (ref 20.0–28.0)
Drawn by: 548791
FIO2: 21
O2 Saturation: 98.2 %
Patient temperature: 37
pCO2 arterial: 26.4 mmHg — ABNORMAL LOW (ref 32.0–48.0)
pH, Arterial: 7.474 — ABNORMAL HIGH (ref 7.350–7.450)
pO2, Arterial: 108 mmHg (ref 83.0–108.0)

## 2020-08-30 LAB — APTT: aPTT: 42 seconds — ABNORMAL HIGH (ref 24–36)

## 2020-08-30 LAB — URINALYSIS, ROUTINE W REFLEX MICROSCOPIC
Bilirubin Urine: NEGATIVE
Glucose, UA: >=500 mg/dL — AB
Hgb urine dipstick: NEGATIVE
Ketones, ur: NEGATIVE mg/dL
Nitrite: NEGATIVE
Protein, ur: NEGATIVE mg/dL
Specific Gravity, Urine: 1.025 (ref 1.005–1.030)
pH: 5 (ref 5.0–8.0)

## 2020-08-30 LAB — TYPE AND SCREEN
ABO/RH(D): O POS
Antibody Screen: NEGATIVE

## 2020-08-30 LAB — SURGICAL PCR SCREEN
MRSA, PCR: NEGATIVE
Staphylococcus aureus: NEGATIVE

## 2020-08-30 LAB — PROTIME-INR
INR: 1.1 (ref 0.8–1.2)
Prothrombin Time: 14 seconds (ref 11.4–15.2)

## 2020-08-30 LAB — ABO/RH: ABO/RH(D): O POS

## 2020-08-30 MED ORDER — CEFAZOLIN SODIUM-DEXTROSE 2-4 GM/100ML-% IV SOLN
2.0000 g | INTRAVENOUS | Status: AC
  Administered 2020-08-31: 2 g via INTRAVENOUS
  Filled 2020-08-30: qty 100

## 2020-08-30 MED ORDER — CHLORHEXIDINE GLUCONATE 4 % EX LIQD
1.0000 "application " | Freq: Once | CUTANEOUS | Status: AC
  Administered 2020-08-31: 1 via TOPICAL
  Filled 2020-08-30: qty 15

## 2020-08-30 MED ORDER — POTASSIUM CHLORIDE 2 MEQ/ML IV SOLN
80.0000 meq | INTRAVENOUS | Status: DC
  Filled 2020-08-30: qty 40

## 2020-08-30 MED ORDER — MAGNESIUM SULFATE 50 % IJ SOLN
40.0000 meq | INTRAMUSCULAR | Status: DC
  Filled 2020-08-30: qty 9.85

## 2020-08-30 MED ORDER — TEMAZEPAM 15 MG PO CAPS: 15.0000 mg | ORAL_CAPSULE | Freq: Once | ORAL | Status: DC | PRN

## 2020-08-30 MED ORDER — CHLORHEXIDINE GLUCONATE 0.12 % MT SOLN
15.0000 mL | Freq: Once | OROMUCOSAL | Status: AC
  Administered 2020-08-31: 15 mL via OROMUCOSAL
  Filled 2020-08-30: qty 15

## 2020-08-30 MED ORDER — NOREPINEPHRINE 4 MG/250ML-% IV SOLN
0.0000 ug/min | INTRAVENOUS | Status: AC
  Administered 2020-08-31: 2 ug/min via INTRAVENOUS
  Filled 2020-08-30: qty 250

## 2020-08-30 MED ORDER — BISACODYL 5 MG PO TBEC
5.0000 mg | DELAYED_RELEASE_TABLET | Freq: Once | ORAL | Status: DC
  Filled 2020-08-30: qty 1

## 2020-08-30 MED ORDER — SODIUM CHLORIDE 0.9 % IV SOLN
INTRAVENOUS | Status: DC
  Filled 2020-08-30: qty 30

## 2020-08-30 MED ORDER — DEXMEDETOMIDINE HCL IN NACL 400 MCG/100ML IV SOLN
0.1000 ug/kg/h | INTRAVENOUS | Status: AC
  Administered 2020-08-31: 3 ug/kg/h via INTRAVENOUS
  Filled 2020-08-30: qty 100

## 2020-08-30 NOTE — Progress Notes (Signed)
Physical Therapy Treatment Patient Details Name: Alexandria Randolph MRN: [id number] DOB: [birth year] Today's Date: 08/30/2020    History of Present Illness Patient is a 86 y/o female who presents on 08/21/20 as a tx from [facility] due to SOB and weakness. BNP 2105. Chest CTA- multifocal PNA, bil pleral effusions and possible bronchitis. Concern for new CHF, aortic stenosis, Elevated D-dimer.  s/p cardiac cath 08/23/20; TAVR planned for 08/31/20. PMH includes HTN, dyslipidemia, breast ca, tobacco abuse.    PT Comments    Patient progressing slowly towards PT goals. Reports not feeling up to much activity recently due to SOB. Tolerated transfers and gait training with Min guard assist and use of rollator for support. Requires multiple standing rest breaks leaning on rollator during ambulation with 2-3/4 DOE. BP pre activity 94/58, post activity BP 104/70, HR up to 113 bpm with activity. RR up to 32 and SP02 >99% on RA. Limited mainly by dyspnea and fatigue. Pt eager for planned TAVR tomorrow. Will follow up post op to further assess d/c needs.   Follow Up Recommendations  Home health PT;Supervision - Intermittent     Equipment Recommendations  Other (comment) (rollator)    Recommendations for Other Services       Precautions / Restrictions Precautions Precautions: Fall;Other (comment) Precaution Comments: soft BP Restrictions Weight Bearing Restrictions: No    Mobility  Bed Mobility Overal bed mobility: Modified Independent             General bed mobility comments: HOB elevated    Transfers Overall transfer level: Needs assistance Equipment used: 4-wheeled walker Transfers: Sit to/from Stand Sit to Stand: Min guard         General transfer comment: Min guard for safety. Stood from [employer], cues to [employer] prior to standing and hand placement.  Ambulation/Gait Ambulation/Gait assistance: Min guard Gait Distance (Feet): 100 Feet Assistive device: 4-wheeled walker Gait  Pattern/deviations: Step-through pattern;Decreased stride length;Trunk flexed Gait velocity: decreased Gait velocity interpretation: 1.31 - 2.62 ft/sec, indicative of limited community ambulator General Gait Details: Slow, mostly steady gait with rollator, multiple standing rest breaks needed. 2-3/4 DOE. RR up to 32. SP02 99% on RA.   Stairs             Wheelchair Mobility    Modified Rankin (Stroke Patients Only)       Balance Overall balance assessment: Needs assistance Sitting-balance support: Feet supported;No upper extremity supported Sitting balance-Leahy Scale: Good     Standing balance support: During functional activity Standing balance-Leahy Scale: Fair Standing balance comment: Pt able to stand statically briefly without UE support while donning face mask but requires UE support for walking.                            Cognition Arousal/Alertness: Awake/alert Behavior During Therapy: WFL for tasks assessed/performed Overall Cognitive Status: No family/caregiver present to determine baseline cognitive functioning Area of Impairment: Memory                     Memory: Decreased short-term memory         General Comments: Impaired memory, otherwise seems [facility] for basic mobility tasks. Not able to recall events during hospitalization      Exercises      General Comments General comments (skin integrity, edema, etc.): BP pre activity 94/58, post activity BP 104/70 HR up to 113 bpm with activity. RR up to 32 and SP02 >99% on  RA.      Pertinent Vitals/Pain Pain Assessment: No/denies pain    Home Living                      Prior Function            PT Goals (current goals can now be found in the care plan section) Progress towards PT goals: Progressing toward goals (slowly)    Frequency    Min 3X/week      PT Plan Current plan remains appropriate    Co-evaluation              AM-PAC PT "6 Clicks"  Mobility   Outcome Measure  Help needed turning from your back to your side while in a flat bed without using bedrails?: None Help needed moving from lying on your back to sitting on the side of a flat bed without using bedrails?: None Help needed moving to and from a bed to a chair (including a wheelchair)?: A Little Help needed standing up from a chair using your arms (e.g., wheelchair or bedside chair)?: A Little Help needed to walk in hospital room?: A Little Help needed climbing 3-5 steps with a railing? : A Little 6 Click Score: 20    End of Session Equipment Utilized During Treatment: Gait belt Activity Tolerance: Patient limited by fatigue Patient left: in bed;with call bell/phone within reach;with bed alarm set Nurse Communication: Mobility status PT Visit Diagnosis: Muscle weakness (generalized) (M62.81);Difficulty in walking, not elsewhere classified (R26.2);Unsteadiness on feet (R26.81)     Time: 6381-7711 PT Time Calculation (min) (ACUTE ONLY): 25 min  Charges:  $Gait Training: 8-22 mins $Therapeutic Exercise: 8-22 mins                     Marisa Severin, PT, DPT Acute Rehabilitation Services Pager [phone] Office [place] 08/30/2020, 8:59 AM

## 2020-08-30 NOTE — Progress Notes (Signed)
   08/30/20 0600  Assess: MEWS Score  Temp 97.6 F (36.4 C)  BP 90/72  SpO2 99 %  O2 Device Room Air  Assess: MEWS Score  MEWS Temp 0  MEWS Systolic 1  MEWS Pulse 1  MEWS RR 0  MEWS LOC 0  MEWS Score 2  MEWS Score Color Yellow  Assess: if the MEWS score is Yellow or Red  Were vital signs taken at a resting state? Yes  Focused Assessment No change from prior assessment  Early Detection of Sepsis Score *See Row Information* Low  MEWS guidelines implemented *See Row Information* No, previously yellow, continue vital signs every 4 hours  Document  Patient Outcome Stabilized after interventions  Progress note created (see row info) Yes  Assess: SIRS CRITERIA  SIRS Temperature  0  SIRS Pulse 1  SIRS Respirations  0  SIRS WBC 0  SIRS Score Sum  1

## 2020-08-30 NOTE — Care Management Important Message (Signed)
Important Message  Patient Details  Name: Alexandria Randolph MRN: [id number] Date of Birth: 14-Jul-1934   Medicare Important Message Given:  Yes     Shelda Altes 08/30/2020, 9:50 AM

## 2020-08-30 NOTE — Progress Notes (Signed)
   08/29/20 2210  Assess: MEWS Score  Temp 98.1 F (36.7 C)  BP 92/74  Pulse Rate (!) 101  Resp 18  SpO2 100 %  O2 Device Room Air  Assess: MEWS Score  MEWS Temp 0  MEWS Systolic 1  MEWS Pulse 1  MEWS RR 0  MEWS LOC 0  MEWS Score 2  MEWS Score Color Yellow  Document  Patient Outcome Stabilized after interventions  Progress note created (see row info) Yes  Assess: SIRS CRITERIA  SIRS Temperature  0  SIRS Pulse 1  SIRS Respirations  0  SIRS WBC 0  SIRS Score Sum  1

## 2020-08-30 NOTE — Progress Notes (Signed)
[facility]  PROGRESS NOTE  [name] WGN:[id number] DOB: [birth year] DOA: 08/21/2020 PCP: Marrian Salvage, FNP   Brief HPI:   86 year old female with past medical history of breast cancer currently on letrozole, remote history of tobacco abuse came in 1 to 2 weeks history of shortness of breath on exertion.  2D echo showed EF of 25% and grade 2 diastolic heart failure with severe aortic stenosis with gradient of 40 mmHg.    Subjective   Reason for examined, denies any complaints.  Awaiting TAVR on Tuesday.   Assessment/Plan:     Severe aortic stenosis -Patient presented with dyspnea on exertion -2D echo showed EF 25%; severe aortic stenosis -Underwent left and right heart catheterization; showed minimal CAD -Aortic valve area 0.39 cm, mean gradient 49 mmHg -Underwent CTA coronary morphology yesterday; deemed a good candidate for TAVR -Plan for TAVR on Tuesday.   New onset CHF -Both systolic, EF 20 to 78% and diastolic with grade 2 diastolic dysfunction -Likely from underlying severe aortic stenosis -Patient on furosemide 40 mg p.o. daily; cardiology recommended to continue Lasix despite soft BP -Continue aspirin, Farxiga   Hypokalemia -Has persistently low normal potassium due to diuresis from Lasix -Started on K-Dur 20 mg p.o. daily -Today potassium is 3.9   Elevated troponin -Troponin was elevated 372, 389 -Likely demand ischemia in the setting of new onset CHF due to severe aortic stenosis -Cardiac catheterization showed minimal CAD   Elevated D-dimer -CTA of chest was negative for pulm embolism -Venous duplex  negative for DVT   History of breast cancer -Femara on hold for now   Scheduled medications:    aspirin  81 mg Oral Daily   empagliflozin  10 mg Oral Daily   furosemide  40 mg Oral Daily   [START ON 08/31/2020] magnesium sulfate  40 mEq Other To OR   [START ON 08/31/2020] potassium chloride  80 mEq Other To OR   potassium  chloride  20 mEq Oral Daily   sodium chloride flush  3 mL Intravenous Q12H   sodium chloride flush  3 mL Intravenous Q12H   sodium chloride flush  3 mL Intravenous Q12H         Data Reviewed:   CBG:  No results for input(s): GLUCAP in the last 168 hours.  SpO2: 99 % O2 Flow Rate (L/min): 2 L/min    Vitals:   08/29/20 2206 08/29/20 2210 08/30/20 0100 08/30/20 0600  BP: (!) 86/62 92/74 (!) 87/63 90/72  Pulse:  (!) 101 95   Resp:  18 19   Temp:  98.1 F (36.7 C) 98.1 F (36.7 C) 97.6 F (36.4 C)  TempSrc:  Oral Oral Oral  SpO2:  100% 98% 99%  Weight:    61 kg  Height:         Intake/Output Summary (Last 24 hours) at 08/30/2020 0924 Last data filed at 08/30/2020 0817 Gross per 24 hour  Intake 337 ml  Output 250 ml  Net 87 ml     06/11 1901 - 06/13 0700 In: 320 [P.O.:320] Out: 400 [Urine:250]  Filed Weights   08/28/20 0412 08/29/20 0400 08/30/20 0600  Weight: 58 kg 60.1 kg 61 kg    CBC:  Recent Labs  Lab 08/23/20 1622 08/23/20 1626 08/24/20 0357 08/26/20 0239  WBC  --   --  5.6 5.9  HGB 11.6*  11.6* 10.9* 12.1 12.1  HCT 34.0*  34.0* 32.0* 36.3 36.5  PLT  --   --  192  216  MCV  --   --  88.1 89.2  MCH  --   --  29.4 29.6  MCHC  --   --  33.3 33.2  RDW  --   --  12.0 12.2    Complete metabolic panel:  Recent Labs  Lab 08/24/20 0357 08/25/20 0723 08/26/20 0239 08/27/20 0320 08/28/20 0304  NA 136 135 135 136 138  K 3.4* 3.9 3.6 3.4* 3.9  CL 105 104 103 103 107  CO2 20* 19* 20* 22 20*  GLUCOSE 76 96 82 105* 107*  BUN 17 16 15 13 13   CREATININE 0.98 0.98 1.01* 0.92 0.95  CALCIUM 8.8* 9.0 9.0 9.0 8.9    No results for input(s): LIPASE, AMYLASE in the last 168 hours.  No results for input(s): CRP, DDIMER, BNP, PROCALCITON, SARSCOV2NAA in the last 168 hours.  Invalid input(s): LACTICACID   ------------------------------------------------------------------------------------------------------------------ No results for input(s): CHOL,  HDL, LDLCALC, TRIG, CHOLHDL, LDLDIRECT in the last 72 hours.  Lab Results  Component Value Date   HGBA1C 5.4 08/21/2020   ------------------------------------------------------------------------------------------------------------------ No results for input(s): TSH, T4TOTAL, T3FREE, THYROIDAB in the last 72 hours.  Invalid input(s): FREET3 ------------------------------------------------------------------------------------------------------------------ No results for input(s): VITAMINB12, FOLATE, FERRITIN, TIBC, IRON, RETICCTPCT in the last 72 hours.  Coagulation profile No results for input(s): INR, PROTIME in the last 168 hours. No results for input(s): DDIMER in the last 72 hours.  Cardiac Enzymes No results for input(s): CKTOTAL, CKMB, CKMBINDEX, TROPONINI in the last 168 hours.  ------------------------------------------------------------------------------------------------------------------    Component Value Date/Time   BNP 2,105.0 (H) 08/21/2020 0708     Antibiotics: Anti-infectives (From admission, onward)    Start     Dose/Rate Route Frequency Ordered Stop   08/31/20 0400  ceFAZolin (ANCEF) IVPB 2g/100 mL premix        2 g 200 mL/hr over 30 Minutes Intravenous To Surgery 08/30/20 0749 09/01/20 0400   08/22/20 1000  cefdinir (OMNICEF) capsule 300 mg  Status:  Discontinued        300 mg Oral Every 12 hours 08/22/20 0856 08/23/20 0719   08/22/20 1000  azithromycin (ZITHROMAX) tablet 500 mg  Status:  Discontinued        500 mg Oral Daily 08/22/20 0856 08/23/20 0719   08/21/20 1500  cefTRIAXone (ROCEPHIN) 1 g in sodium chloride 0.9 % 100 mL IVPB        1 g 200 mL/hr over 30 Minutes Intravenous  Once 08/21/20 1455 08/21/20 1540   08/21/20 1500  azithromycin (ZITHROMAX) 500 mg in sodium chloride 0.9 % 250 mL IVPB  Status:  Discontinued        500 mg 250 mL/hr over 60 Minutes Intravenous  Once 08/21/20 1455 08/21/20 1611        Radiology Reports  No results  found.     DVT prophylaxis: SCDs, start 08/25/2020-we will switch to Lovenox after TAVR  Code Status: DNR  Family Communication: No family at bedside   Consultants: Cardiology  Procedures:     Objective    Physical Examination:  General-appears in no acute distress Heart-S1-S2, regular, no murmur auscultated Lungs-clear to auscultation bilaterally, no wheezing or crackles auscultated Abdomen-soft, nontender, no organomegaly Extremities-no edema in the lower extremities Neuro-alert, oriented x3, no focal deficit noted.  Status is: Inpatient  Dispo: The patient is from: Home              Anticipated d/c is to: Home  Anticipated d/c date is: 09/01/2020              Patient currently not stable for discharge  Barrier to discharge-plan for TAVR as per cardiology  COVID-19 Labs  No results for input(s): DDIMER, FERRITIN, LDH, CRP in the last 72 hours.  Lab Results  Component Value Date   [place] NEGATIVE 08/21/2020    Microbiology  Recent Results (from the past 240 hour(s))  Resp Panel by RT-PCR (Flu A&B, Covid) Nasopharyngeal Swab     Status: None   Collection Time: 08/21/20  7:54 AM   Specimen: Nasopharyngeal Swab; Nasopharyngeal(NP) swabs in vial transport medium  Result Value Ref Range Status   SARS Coronavirus 2 by RT PCR NEGATIVE NEGATIVE Final    Comment: (NOTE) SARS-CoV-2 target nucleic acids are NOT DETECTED.  The SARS-CoV-2 RNA is generally detectable in upper respiratory specimens during the acute phase of infection. The lowest concentration of SARS-CoV-2 viral copies this assay can detect is 138 copies/mL. A negative result does not preclude SARS-Cov-2 infection and should not be used as the sole basis for treatment or other patient management decisions. A negative result may occur with  improper specimen collection/handling, submission of specimen other than nasopharyngeal swab, presence of viral mutation(s) within the areas  targeted by this assay, and inadequate number of viral copies(<138 copies/mL). A negative result must be combined with clinical observations, patient history, and epidemiological information. The expected result is Negative.  Fact Sheet for Patients:  EntrepreneurPulse.com.au  Fact Sheet for Healthcare Providers:  IncredibleEmployment.be  This test is no t yet approved or cleared by the [place] FDA and  has been authorized for detection and/or diagnosis of SARS-CoV-2 by FDA under an Emergency Use Authorization (EUA). This EUA will remain  in effect (meaning this test can be used) for the duration of the COVID-19 declaration under Section 564(b)(1) of the Act, 21 U.S.C.section 360bbb-3(b)(1), unless the authorization is terminated  or revoked sooner.       Influenza A by PCR NEGATIVE NEGATIVE Final   Influenza B by PCR NEGATIVE NEGATIVE Final    Comment: (NOTE) The Xpert Xpress SARS-CoV-2/FLU/RSV plus assay is intended as an aid in the diagnosis of influenza from Nasopharyngeal swab specimens and should not be used as a sole basis for treatment. Nasal washings and aspirates are unacceptable for Xpert Xpress SARS-CoV-2/FLU/RSV testing.  Fact Sheet for Patients: EntrepreneurPulse.com.au  Fact Sheet for Healthcare Providers: IncredibleEmployment.be  This test is not yet approved or cleared by the [place] FDA and has been authorized for detection and/or diagnosis of SARS-CoV-2 by FDA under an Emergency Use Authorization (EUA). This EUA will remain in effect (meaning this test can be used) for the duration of the COVID-19 declaration under Section 564(b)(1) of the Act, 21 U.S.C. section 360bbb-3(b)(1), unless the authorization is terminated or revoked.  Performed at [facility], [place] [address] [place], [place] [address]              Oswald Hillock   [facility] If 7PM-7AM, please contact night-coverage at [email or web address], Office  [phone]   08/30/2020, 9:24 AM  LOS: 9 days

## 2020-08-30 NOTE — Consult Note (Addendum)
[facility] [place]  Cardiology Consultation:   Patient ID: Alexandria Randolph MRN: [id number]; DOB: 1934-10-03  Admit date: 08/21/2020 Date of Consult: 08/30/2020  Primary Care Provider: Marrian Randolph, [facility] Cardiologist: Alexandria Heinz, MD  [place] Electrophysiologist:  None    Patient Profile:   Alexandria Randolph is a 86 y.o. female with a hx of HTN, HLD, breast cancer s/p lumpectomy and radioactive seed Rx (2018) and remote, light tobacco abuse who is being seen today for the evaluation of severe AS and new HFrEF at the request of Dr. Burt Randolph.  History of Present Illness:   Alexandria Randolph lives alone in a house in [place].  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  She is not married and has no kids.  She has a very close, longtime friend named Alexandria Randolph who is present in the room today.  He has lived with her previously, but currently lives down the street.  He plans to move back in with her given her recent decline in health.  The patient takes care of all of her own ADLs, including cooking and cleaning.  She occasionally drives, but Alexandria Randolph mostly chauffeurs her around.  She can walk without the aid of a cane or walker.  She is not very active.  The most activity she gets is walking around her house or out to her car or mailbox. She is missing some teeth, but has had regular dental work with no active issues.  She has been fully vaccinated and boosted for Covid 19.   She has no past cardiac history but does follow with a PCP. The only medication she takes is letrozole. She does not ever remember being told she had a heart murmur.   She was in her usual state of health until about 1-2 weeks prior to admission when she developed sudden onset of shortness of breath and orthopnea, which became progressively worse. Her friend,  Alexandria Randolph finally talked her into going to the emergency room for evaluation and she presented to [facility] on 6/4. In the ED, patient tachypneic and hypotensive at times. O2 sats >90% on room air. EKG showed sinus tachycardia (HR 112), septal Q waves, and ST depressions with T wave inversions in lateral and anterolateral precordial leads. These were new compared to prior ECGs. High-sensitivity troponin elevated and flat at 372 >> 389. BNP markedly elevated at 2,105. Chest x-ray showed mild cardiomegaly with no overt edema. D-dimer elevated at 1.17. Chest CTA negative for PE but did show some consolidation in the right lower lobe and to a lesser extent posteriorly in the right upper lobe concerning for multilobar pneumonia. This also showed aortic valve calcification and scattered coronary calcifications suggestive of chronic coronary and valvular heart disease. LE dopplers negative for DVT. Procalcitonin was normal. WBC 5.9, Hgb 13.4, Plts 253. Na 140, K 4.1, Glucose 148, BUN 22, Cr 1.19. Respiratory panel negative for COVID-19 and influenza A/B. Patient was started on IV Lasix as well as antibiotics (treated with 1 dose of azithromycin and two doses of cefdinir). Echo revealed severe LV systolic dysfunction with an of EF 20-25% and severe AS (Vmax 4.3, MG 49, AVA 0.7, DI 0.26, SVI 49). [facility] 08/23/20 showed minimal CAD with at most 30 to 40% ostial left main and critical AS with a peak to peak gradient of 72 mmHg and a valve area of  0.39 cm indexed to 0.25 cm/m. Her LVEDP was elevated at 38 and she received 40 mg of Lasix IV prior to leaving the Cath Lab. IV lasix was continued but diuresis was limited by hypotension. She is now on oral lasix and net negative 3.2L. Creat stable ~1.12. The structural heart team was consulted for consideration of TAVR. Given ongoing hypotension and severely reduced LV function, it was decided to keep her inpatient for TAVR. Cardiac gated CTA of the heart reveals anatomical characteristics  consistent with aortic stenosis suitable for treatment by transcatheter aortic valve replacement without any significant complicating features and CTA of the aorta and iliac vessels demonstrate what appear to be adequate pelvic vascular access to facilitate a transfemoral approach.    She is feeling much better since being diuresed in the hospital and glad she stayed in the hospital for surgery.   Past Medical History:  Diagnosis Date   Allergy    SEASONAL   Cataracts, bilateral    DYSLIPIDEMIA 12/01/2006   Environmental allergies    has had allergy test:grass,trees,plants,dust   HYPERGLYCEMIA 12/01/2006   HYPERTENSION 12/01/2006   OSTEOPOROSIS 12/01/2006   Severe aortic stenosis    SMOKER 12/01/2006    Past Surgical History:  Procedure Laterality Date   ABDOMINAL HYSTERECTOMY     BREAST LUMPECTOMY WITH RADIOACTIVE SEED LOCALIZATION Right 01/19/2017   Procedure: RIGHT BREAST LUMPECTOMY WITH RADIOACTIVE SEED LOCALIZATION;  Surgeon: Alexandria Skates, MD;  Location: [place];  Service: General;  Laterality: Right;   COLONOSCOPY     POLYPECTOMY     RIGHT/LEFT HEART CATH AND CORONARY ANGIOGRAPHY N/A 08/23/2020   Procedure: RIGHT/LEFT HEART CATH AND CORONARY ANGIOGRAPHY;  Surgeon: Alexandria Harp, MD;  Location: [place] [facility];  Service: Cardiovascular;  Laterality: N/A;   TOTAL ABDOMINAL HYSTERECTOMY W/ BILATERAL SALPINGOOPHORECTOMY  1980     Home Medications:  Prior to Admission medications   Medication Sig Start Date End Date Taking? Authorizing Provider  fexofenadine (ALLEGRA) 180 MG tablet Take 180 mg by mouth daily.   Yes [provider]  letrozole (FEMARA) 2.5 MG tablet Take 1 tablet (2.5 mg total) by mouth daily. 06/02/20  Yes Alexandria Lose, MD  Polyethyl Glycol-Propyl Glycol (SYSTANE FREE OP) Place 1 drop into both eyes daily as needed (For dry eyes).   Yes [provider]    Inpatient Medications: Scheduled Meds:  aspirin  81 mg Oral Daily    empagliflozin  10 mg Oral Daily   furosemide  40 mg Oral Daily   [START ON 08/31/2020] magnesium sulfate  40 mEq Other To OR   [START ON 08/31/2020] potassium chloride  80 mEq Other To OR   potassium chloride  20 mEq Oral Daily   sodium chloride flush  3 mL Intravenous Q12H   sodium chloride flush  3 mL Intravenous Q12H   sodium chloride flush  3 mL Intravenous Q12H   Continuous Infusions:  sodium chloride     [START ON 08/31/2020]  ceFAZolin (ANCEF) IV     [START ON 08/31/2020] dexmedetomidine     [START ON 08/31/2020] heparin 30,000 units/NS 1000 mL solution for CELLSAVER     [START ON 08/31/2020] norepinephrine (LEVOPHED) Adult infusion     PRN Meds: sodium chloride, acetaminophen **OR** acetaminophen, morphine injection, ondansetron (ZOFRAN) IV, polyethylene glycol, sodium chloride flush  Allergies:    Allergies  Allergen Reactions   Losartan Potassium     REACTION: sob   Olmesartan Medoxomil     REACTION: sob  Simvastatin     REACTION: Malgias    Social History:   Social History   Socioeconomic History   Marital status: Single    Spouse name: Not on file   Number of children: 0   Years of education: Not on file   Highest education level: Not on file  Occupational History   Occupation: Retired  Tobacco Use   Smoking status: Former    Pack years: 0.00    Types: Cigarettes    Quit date: 03/20/2000    Years since quitting: 20.4   Smokeless tobacco: Never   Tobacco comments:    Pt states that she smoked off and on- "I don't know for how long"  Substance and Sexual Activity   Alcohol use: Not Currently    Alcohol/week: 0.0 standard drinks    Comment: occ   Drug use: No   Sexual activity: Not on file  Other Topics Concern   Not on file  Social History Narrative   Married   Social Determinants of Health   Financial Resource Strain: Low Risk    Difficulty of Paying Living Expenses: Not hard at all  Food Insecurity: No Food Insecurity   Worried About Paediatric nurse in the Last Year: Never true   Arboriculturist in the Last Year: Never true  Transportation Needs: No Transportation Needs   Lack of Transportation (Medical): No   Lack of Transportation (Non-Medical): No  Physical Activity: Not on file  Stress: Not on file  Social Connections: Not on file  Intimate Partner Violence: Not on file    Family History:    Family History  Problem Relation Age of Onset   Cancer Mother        "Throat" and colon cancer, both at an advanced age   Diabetes Father    Breast cancer Paternal Aunt    Colon cancer Neg Hx      ROS:  Please see the history of present illness.  All other ROS reviewed and negative.     Physical Exam/Data:   Vitals:   08/29/20 2210 08/30/20 0100 08/30/20 0600 08/30/20 1134  BP: 92/74 (!) 87/63 90/72 (!) 93/57  Pulse: (!) 101 95  100  Resp: 18 19  18   Temp: 98.1 F (36.7 C) 98.1 F (36.7 C) 97.6 F (36.4 C) 97.8 F (36.6 C)  TempSrc: Oral Oral Oral Oral  SpO2: 100% 98% 99% 99%  Weight:   61 kg   Height:        Intake/Output Summary (Last 24 hours) at 08/30/2020 1403 Last data filed at 08/30/2020 1256 Gross per 24 hour  Intake 574 ml  Output 250 ml  Net 324 ml   Last 3 Weights 08/30/2020 08/29/2020 08/28/2020  Weight (lbs) 134 lb 7.7 oz 132 lb 7.9 oz 127 lb 13.9 oz  Weight (kg) 61 kg 60.1 kg 58 kg     Body mass index is 27.16 kg/m.  General:  Well nourished, well developed, in no acute distress HEENT: normal Lymph: no adenopathy Neck: no JVD Endocrine:  No thyromegaly Cardiac:  normal S1, S2; RRR; very soft murmur Lungs:  clear to auscultation bilaterally, no wheezing, rhonchi or rales  Abd: soft, nontender, no hepatomegaly  Ext: no edema Musculoskeletal:  No deformities, BUE and BLE strength normal and equal Skin: warm and dry  Neuro:  CNs 2-12 intact, no focal abnormalities noted Psych:  Normal affect   EKG:  The EKG was personally reviewed and demonstrates: sinus  tachy with HR 107, LAD, non  specific ST/TW changes.  Telemetry:  Telemetry was personally reviewed and demonstrates: sinus  Relevant CV Studies: Echo 08/22/20 IMPRESSIONS   1. Left ventricular ejection fraction, by estimation, is 20 to 25%. The  left ventricle has severely decreased function. The left ventricle  demonstrates global hypokinesis. There is mild left ventricular  hypertrophy. Left ventricular diastolic parameters   are consistent with Grade II diastolic dysfunction (pseudonormalization).  Elevated left atrial pressure.   2. Right ventricular systolic function is mildly reduced. The right  ventricular size is normal. Tricuspid regurgitation signal is inadequate  for assessing PA pressure.   3. The mitral valve is normal in structure. Trivial mitral valve  regurgitation.   4. The aortic valve is calcified. There is severe calcifcation of the  aortic valve. Aortic valve regurgitation is trivial. Severe aortic valve  stenosis. Vmax 4.3 m/s, MG 49 mmHg, AVA 0.7 cm^2, DI 0.26   5. The inferior vena cava is normal in size with <50% respiratory  variability, suggesting right atrial pressure of 8 mmHg.   FINDINGS   Left Ventricle: Left ventricular ejection fraction, by estimation, is 20  to 25%. The left ventricle has severely decreased function. The left  ventricle demonstrates global hypokinesis. The left ventricular internal  cavity size was normal in size. There  is mild left ventricular hypertrophy. Left ventricular diastolic  parameters are consistent with Grade II diastolic dysfunction  (pseudonormalization). Elevated left atrial pressure.   Right Ventricle: The right ventricular size is normal. Right vetricular  wall thickness was not well visualized. Right ventricular systolic  function is mildly reduced. Tricuspid regurgitation signal is inadequate  for assessing PA pressure.   Left Atrium: Left atrial size was normal in size.   Right Atrium: Right atrial size was normal in size.    Pericardium: There is no evidence of pericardial effusion.   Mitral Valve: The mitral valve is normal in structure. Trivial mitral  valve regurgitation.   Tricuspid Valve: The tricuspid valve is normal in structure. Tricuspid  valve regurgitation is trivial.   Aortic Valve: The aortic valve is calcified. There is severe calcifcation  of the aortic valve. Aortic valve regurgitation is trivial. Severe aortic  stenosis is present. Aortic valve mean gradient measures 40.2 mmHg. Aortic  valve peak gradient measures  66.2 mmHg. Aortic valve area, by VTI measures 0.82 cm.   Pulmonic Valve: The pulmonic valve was not well visualized. Pulmonic valve  regurgitation is not visualized.   Aorta: The aortic root and ascending aorta are structurally normal, with  no evidence of dilitation.   Venous: The inferior vena cava is normal in size with less than 50%  respiratory variability, suggesting right atrial pressure of 8 mmHg.   IAS/Shunts: The interatrial septum was not well visualized.      LEFT VENTRICLE  PLAX 2D  LVIDd:         4.40 cm     Diastology  LVIDs:         3.80 cm     LV e' medial:    3.26 cm/s  LV PW:         1.30 cm     LV E/e' medial:  31.0  LV IVS:        1.20 cm     LV e' lateral:   4.68 cm/s  LVOT diam:     1.90 cm     LV E/e' lateral: 21.6  LV SV:  77  LV SV Index:   49  LVOT Area:     2.84 cm     LV Volumes (MOD)  LV vol d, MOD A2C: 79.7 ml  LV vol d, MOD A4C: 44.9 ml  LV vol s, MOD A2C: 78.1 ml  LV vol s, MOD A4C: 55.6 ml  LV SV MOD A2C:     1.6 ml  LV SV MOD A4C:     44.9 ml  LV SV MOD BP:      15.1 ml   RIGHT VENTRICLE            IVC  RV S prime:     6.31 cm/s  IVC diam: 1.80 cm  TAPSE (M-mode): 1.3 cm   LEFT ATRIUM             Index       RIGHT ATRIUM          Index  LA diam:        3.40 cm 2.18 cm/m  RA Area:     8.95 cm  LA Vol (A2C):   29.0 ml 18.61 ml/m RA Volume:   17.40 ml 11.17 ml/m  LA Vol (A4C):   40.9 ml 26.25 ml/m  LA  Biplane Vol: 37.4 ml 24.00 ml/m   AORTIC VALVE                    PULMONIC VALVE  AV Area (Vmax):    0.91 cm     PR End Diast Vel: 2.66 msec  AV Area (Vmean):   0.84 cm  AV Area (VTI):     0.82 cm  AV Vmax:           406.80 cm/s  AV Vmean:          298.600 cm/s  AV VTI:            0.937 m  AV Peak Grad:      66.2 mmHg  AV Mean Grad:      40.2 mmHg  LVOT Vmax:         131.00 cm/s  LVOT Vmean:        88.700 cm/s  LVOT VTI:          0.271 m  LVOT/AV VTI ratio: 0.29     AORTA  Ao Root diam: 2.90 cm  Ao Asc diam:  3.30 cm   MITRAL VALVE  MV Area (PHT): 9.85 cm     SHUNTS  MV Decel Time: 77 msec      Systemic VTI:  0.27 m  MV E velocity: 101.00 cm/s  Systemic Diam: 1.90 cm  MV A velocity: 61.30 cm/s  MV E/A ratio:  1.65    _____________________   Carotid dopplers 08/23/20 Summary:  Right Carotid: Velocities in the right ICA are consistent with a 1-39%  stenosis.   Left Carotid: Velocities in the left ICA are consistent with a 1-39%  stenosis.   Vertebrals:  Bilateral vertebral arteries demonstrate antegrade flow.  Subclavians: Normal flow hemodynamics were seen in bilateral subclavian               arteries.  ________________________  [facility] 08/23/20 RIGHT/LEFT HEART CATH AND CORONARY ANGIOGRAPHY    Conclusion    Ost LM lesion is 30% stenosed. Hemodynamic findings consistent with aortic valve stenosis.    IMPRESSION: Alexandria Randolph has minimal CAD with at most 73 to 40% ostial left main.  She has critical AS with a peak to  peak gradient of 72 mmHg and a valve area of 0.39 cm indexed to 0.25 cm/m.  She has severe LV dysfunction.  She may be a good candidate for TAVR.  Her LVEDP was elevated at 38 and she received 40 mg of Lasix IV prior to leaving the Cath Lab.  Dr. Johnsie Cancel, her attending cardiologist, was notified of these results.  The sheaths were removed and MYNX closure devices were deployed in both the right common femoral artery and vein achieving hemostasis.  The  patient left lab in stable condition.  ________________________  Cardiac CT 08/26/20 IMPRESSION: 1.  Calcified tri leaflet aortic valve with score 2089   2.  Normal aortic root diameter 3.0 cm moderate atherosclerosis   3.  Aortic annulus 400 mm2 suitable for a 23 mm Sapien 3 valve   4. Optimum angiographic angle for deployment LAO 5 Caudal 18 degrees   5.  Coronary arteries sufficient height above annulus for deployment   [name]  ____________________________  CT angio chest/abd/pelvis 08/26/20 VASCULAR MEASUREMENTS PERTINENT TO TAVR:   AORTA:   Minimal Aortic Diameter-10.3 x 10.0 mm   Severity of Aortic Calcification-moderate   RIGHT PELVIS:   Right Common Iliac Artery -   Minimal Diameter-7.3 x 5.3 mm   Tortuosity-mild   Calcification-severe   Right External Iliac Artery -   Minimal Diameter-5.2 x 5.0 mm   Tortuosity-mild   Calcification-mild   Right Common Femoral Artery -   Minimal Diameter-5.3 x 4.5 mm   Tortuosity-mild   Calcification-moderate   LEFT PELVIS:   Left Common Iliac Artery -   Minimal Diameter-7.2 x 5.6 mm   Tortuosity-mild   Calcification-moderate   Left External Iliac Artery -   Minimal Diameter-5.2 x 4.9 mm   Tortuosity-mild   Calcification-mild   Left Common Femoral Artery -   Minimal Diameter-5.1 x 4.4 mm   Tortuosity-mild   Calcification-moderate   Review of the MIP images confirms the above findings.   IMPRESSION: 1. Vascular findings and measurements pertinent to potential TAVR procedure, as detailed. 2. Diffuse thickening and coarse calcification of the aortic valve, compatible with the reported history of aortic stenosis. 3. Borderline mild cardiomegaly. Trace pericardial effusion/thickening. Three-vessel coronary atherosclerosis. 4. Small dependent bilateral pleural effusions. 5. Irregular nodular and masslike focus of consolidation in the posterior right lower and posterior right upper lung  lobes, stable in size and slightly more organized since 08/21/2020 chest CT, indeterminate for pneumonia versus neoplasm. Short-term follow-up chest CT recommended in 2-3 months, preferably after a trial of antibiotic therapy. 6. Mild centrilobular emphysema with diffuse bronchial wall thickening, suggesting COPD. 7. Small hiatal hernia. 8. Mild left colonic diverticulosis. 9. Small amount of layering hyperdense material within the distended gallbladder, cannot exclude small gallstones. 10. Aortic Atherosclerosis (ICD10-I70.0) and Emphysema (ICD10-J43.9).     Laboratory Data:  High Sensitivity Troponin:   Recent Labs  Lab 08/21/20 1558 08/21/20 1834  TROPONINIHS 372* 389*     Chemistry Recent Labs  Lab 08/27/20 0320 08/28/20 0304 08/30/20 0901  NA 136 138 137  K 3.4* 3.9 3.8  CL 103 107 109  CO2 22 20* 17*  GLUCOSE 105* 107* 141*  BUN 13 13 18   CREATININE 0.92 0.95 1.12*  CALCIUM 9.0 8.9 9.2  GFRNONAA >60 59* 48*  ANIONGAP 11 11 11     No results for input(s): PROT, ALBUMIN, AST, ALT, ALKPHOS, BILITOT in the last 168 hours. Hematology Recent Labs  Lab 08/23/20 1626 08/24/20 0357 08/26/20 0239  WBC  --  5.6 5.9  RBC  --  4.12 4.09  HGB 10.9* 12.1 12.1  HCT 32.0* 36.3 36.5  MCV  --  88.1 89.2  MCH  --  29.4 29.6  MCHC  --  33.3 33.2  RDW  --  12.0 12.2  PLT  --  192 216   BNPNo results for input(s): BNP, PROBNP in the last 168 hours.  DDimer No results for input(s): DDIMER in the last 168 hours.   Radiology/Studies:  No results found.  STS Risk Calculator: Procedure: AV Replacement  Procedure: Isolated AVR Risk of Mortality: 4.323% Renal Failure: 2.954% Permanent Stroke: 2.329% Prolonged Ventilation: 18.244% DSW Infection: 0.090% Reoperation: 4.283% Morbidity or Mortality: 22.377% Short Length of Stay: 14.331% Long Length of Stay: 13.430%   ____________________   [place]  KCCQ-12 08/23/2020  1 a.  Ability to shower/bathe Quite a bit limited  1 b. Ability to walk 1 block Extremely limited  1 c. Ability to hurry/jog Other, Did not do  2. Edema feet/ankles/legs Never over the past 2 weeks  3. Limited by fatigue Less than once a week  4. Limited by dyspnea All of the time  5. Sitting up / on 3+ pillows Every night  6. Limited enjoyment of life Extremely limited  7. Rest of life w/ symptoms Not at all satisfied  8 a. Participation in hobbies Severely limited  8 b. Participation in chores Severely limited  8 c. Visiting family/friends Severely limited      Assessment and Plan:  Alexandria Randolph is a 86 y.o. female with symptoms of severe, stage D1 aortic stenosis with NYHA Class III symptoms. I have reviewed the patient's recent echocardiogram which is notable for severely reduced LV systolic function (EF 70-62%) and severe aortic stenosis with peak gradient of 19mmhg and mean transvalvular gradient of 46 mm hg. The patient's dimensionless index is 0.27 and calculated aortic valve area is 0.84 cm. [facility] 08/23/20 showed minimal CAD with at most 30 to 40% ostial left main and critical AS with a peak to peak gradient of 72 mmHg and a valve area of 0.39 cm indexed to 0.25 cm/m. Her LVEDP was elevated at 38. Cardiac gated CTA of the heart reveals anatomical characteristics consistent with aortic stenosis suitable for treatment by transcatheter aortic valve replacement without any significant complicating features and CTA of the aorta and iliac vessels demonstrate what appear to be adequate pelvic vascular access to facilitate a transfemoral approach. Additionally, CTA showed an "irregular nodular and masslike focus of consolidation in the posterior right lower and posterior right upper lung lobes, stable in size and slightly more organized since 08/21/2020 chest CT, indeterminate for pneumonia versus neoplasm. Short-term follow-up chest CT recommended in 2-3 months, preferably after a trial of  antibiotic therapy." Will discuss if abx therapy is needed with Dr. Cyndia Bent. She received one dose of azithromycin and two doses of cefdinir.   I have reviewed the natural history of aortic stenosis with the patient. We have discussed the limitations of medical therapy and the poor prognosis associated with symptomatic aortic stenosis. We have reviewed potential treatment options, including palliative medical therapy, conventional surgical aortic valve replacement, and transcatheter aortic valve replacement. We discussed treatment options in the context of this patient's specific comorbid medical conditions.   The patient's predicted risk of mortality with conventional aortic valve replacement is 4.323% primarily based on age, acute CHF, HTN and breast cancer. TAVR seems like a reasonable treatment option for this patient given advanced  age and severe LV dysfunction, which is felt to be likely related to critical AS. She is scheduled for TAVR - 5th case tomorrow with Dr. Burt Randolph and Dr. Cyndia Bent. Pre op orders have been placed.      New York Heart Association (NYHA) Functional Class NYHA Class III        For questions or updates, please contact [facility] Please consult [email or web address] for contact info under    Signed, Angelena Form, PA-C  08/30/2020 2:03 PM  Patient seen and examined, chart reviewed I agree with the findings, assessment and plan as noted above.  She was admitted with acute on chronic combined systolic and diastolic congestive heart failure with an LVEDP Of 38 and ejection fraction of 20 to 25% by echo.  The aortic valve mean gradient was 40.2 mmHg peak gradient of 66 mmHg and a valve area of 0.82 cm.  Cardiac catheterization showed mild nonsignificant coronary disease and a mean gradient across aortic valve of 52.6 mmHg consistent with severe aortic stenosis.  Her gated cardiac CTA shows anatomy suitable for transcatheter aortic valve replacement using a SAPIEN 3 valve.  Her  abdominal and pelvic CTA shows adequate pelvic vascular anatomy to allow transfemoral insertion.  She had a CT angio of the chest rule out pulmonary embolism on admission which was negative but did show consolidation right lower lobe and to lesser extent posteriorly in the right upper lobe.  This was not significantly changed on her CTA of the chest done 4 days later although slightly more organized.  I suspect this is probably some pneumonia exacerbated by congestive heart failure.  I think it would be worthwhile treating her with a course of antibiotics postoperatively and then repeating her CT scan in 2 to 3 months to rule out neoplasm.  We discussed complications that might develop including but not limited to risks of death, stroke, paravalvular leak, aortic dissection or other major vascular complications, aortic annulus rupture, device embolization, cardiac rupture or perforation, mitral regurgitation, acute myocardial infarction, arrhythmia, heart block or bradycardia requiring permanent pacemaker placement, congestive heart failure, respiratory failure, renal failure, pneumonia, infection, other late complications related to structural valve deterioration or migration, or other complications that might ultimately cause a temporary or permanent loss of functional independence or other long term morbidity. The patient provides full informed consent for the procedure as described and all questions were answered. She is currently a DNR but is agreeable to limited resuscitation during the procedure if needed but does not want to have her chest opened.

## 2020-08-30 NOTE — Plan of Care (Signed)
  Problem: Safety: Goal: Ability to remain free from injury will improve Outcome: Completed/Met

## 2020-08-30 NOTE — Progress Notes (Signed)
Progress Note  Patient Name: Alexandria Randolph Date of Encounter: 08/30/2020  [facility] Cardiologist: Donato Heinz, MD   Subjective   Feeling better, dyspnea improved with diuretic.  Awaiting TAVR Tuesday.  Her [name] is out this morning, runs a lawn care company.  Inpatient Medications    Scheduled Meds:  aspirin  81 mg Oral Daily   empagliflozin  10 mg Oral Daily   furosemide  40 mg Oral Daily   [START ON 08/31/2020] magnesium sulfate  40 mEq Other To OR   [START ON 08/31/2020] potassium chloride  80 mEq Other To OR   potassium chloride  20 mEq Oral Daily   sodium chloride flush  3 mL Intravenous Q12H   sodium chloride flush  3 mL Intravenous Q12H   sodium chloride flush  3 mL Intravenous Q12H   Continuous Infusions:  sodium chloride     [START ON 08/31/2020]  ceFAZolin (ANCEF) IV     [START ON 08/31/2020] dexmedetomidine     [START ON 08/31/2020] heparin 30,000 units/NS 1000 mL solution for CELLSAVER     [START ON 08/31/2020] norepinephrine (LEVOPHED) Adult infusion     PRN Meds: sodium chloride, acetaminophen **OR** acetaminophen, morphine injection, ondansetron (ZOFRAN) IV, polyethylene glycol, sodium chloride flush   Vital Signs    Vitals:   08/29/20 2206 08/29/20 2210 08/30/20 0100 08/30/20 0600  BP: (!) 86/62 92/74 (!) 87/63 90/72  Pulse:  (!) 101 95   Resp:  18 19   Temp:  98.1 F (36.7 C) 98.1 F (36.7 C) 97.6 F (36.4 C)  TempSrc:  Oral Oral Oral  SpO2:  100% 98% 99%  Weight:    61 kg  Height:        Intake/Output Summary (Last 24 hours) at 08/30/2020 0855 Last data filed at 08/30/2020 0817 Gross per 24 hour  Intake 457 ml  Output 250 ml  Net 207 ml   Last 3 Weights 08/30/2020 08/29/2020 08/28/2020  Weight (lbs) 134 lb 7.7 oz 132 lb 7.9 oz 127 lb 13.9 oz  Weight (kg) 61 kg 60.1 kg 58 kg      Telemetry    Sinus rhythm- Personally Reviewed  ECG    Sinus rhythm- Personally Reviewed  Physical Exam   GEN: No acute  distress.   Neck: No JVD Cardiac: RRR, 2/6 systolic murmur,no rubs, or gallops.  (Surprisingly soft for severe AS) Respiratory: Clear to auscultation bilaterally. GI: Soft, nontender, non-distended  MS: No edema; No deformity. Neuro:  Nonfocal  Psych: Normal affect   Labs    High Sensitivity Troponin:   Recent Labs  Lab 08/21/20 1558 08/21/20 1834  TROPONINIHS 372* 389*      Chemistry Recent Labs  Lab 08/26/20 0239 08/27/20 0320 08/28/20 0304  NA 135 136 138  K 3.6 3.4* 3.9  CL 103 103 107  CO2 20* 22 20*  GLUCOSE 82 105* 107*  BUN 15 13 13   CREATININE 1.01* 0.92 0.95  CALCIUM 9.0 9.0 8.9  GFRNONAA 55* >60 59*  ANIONGAP 12 11 11      Hematology Recent Labs  Lab 08/23/20 1626 08/24/20 0357 08/26/20 0239  WBC  --  5.6 5.9  RBC  --  4.12 4.09  HGB 10.9* 12.1 12.1  HCT 32.0* 36.3 36.5  MCV  --  88.1 89.2  MCH  --  29.4 29.6  MCHC  --  33.3 33.2  RDW  --  12.0 12.2  PLT  --  192 216  BNPNo results for input(s): BNP, PROBNP in the last 168 hours.   DDimer No results for input(s): DDIMER in the last 168 hours.   Radiology    No results found.  Cardiac Studies     1. Left ventricular ejection fraction, by estimation, is 20 to 25%. The  left ventricle has severely decreased function. The left ventricle  demonstrates global hypokinesis. There is mild left ventricular  hypertrophy. Left ventricular diastolic parameters   are consistent with Grade II diastolic dysfunction (pseudonormalization).  Elevated left atrial pressure.   2. Right ventricular systolic function is mildly reduced. The right  ventricular size is normal. Tricuspid regurgitation signal is inadequate  for assessing PA pressure.   3. The mitral valve is normal in structure. Trivial mitral valve  regurgitation.   4. The aortic valve is calcified. There is severe calcifcation of the  aortic valve. Aortic valve regurgitation is trivial. Severe aortic valve  stenosis. Vmax 4.3 m/s, MG 49  mmHg, AVA 0.7 cm^2, DI 0.26   5. The inferior vena cava is normal in size with <50% respiratory  variability, suggesting right atrial pressure of 8 mmHg.   Diagnostic Dominance: Co-dominant       Patient Profile     86 y.o. female with severe aortic stenosis, low ejection fraction/systolic heart failure acute, now awaiting TAVR, no significant CAD on cath.  Assessment & Plan    Severe aortic stenosis - Reviewed Dr. Kyla Balzarine note, cardiac catheterization showed no CAD but severe aortic stenosis.  CT scan on 08/25/2020 demonstrated that she would be a good candidate for a 23 mm SAPIEN 3 valve.  Structural heart team have discussed with the OR team and can add her onto Tuesday schedule for fifth case TAVR.  She has been seen by Dr. Burt Knack.  Awaiting surgical consultation. -Continuing daily diuretic although BP remains soft. -Echo EF 25% with diffuse hypokinesis and severe aortic stenosis -Telemetry demonstrates sinus rhythm -ECG shows sinus rhythm with left axis deviation chronic lateral ST segment changes.  Personally reviewed.  Chronic systolic heart failure - Diuresing based upon elevated left ventricular end-diastolic pressure.  Blood pressure has been soft.  Aortic stenosis playing a significant role.  Feels much better. -Continue with p.o. Lasix 40.     For questions or updates, please contact [place] Please consult [email or web address] for contact info under        Signed, Candee Furbish, MD  08/30/2020, 8:55 AM

## 2020-08-30 NOTE — Plan of Care (Signed)
  Problem: Activity: Goal: Risk for activity intolerance will decrease Outcome: Progressing   

## 2020-08-30 NOTE — Progress Notes (Signed)
   08/30/20 0100  Assess: MEWS Score  Temp 98.1 F (36.7 C)  BP (!) 87/63  O2 Device Room Air  Assess: MEWS Score  MEWS Temp 0  MEWS Systolic 1  MEWS Pulse 1  MEWS RR 0  MEWS LOC 0  MEWS Score 2  MEWS Score Color Yellow  Assess: if the MEWS score is Yellow or Red  Were vital signs taken at a resting state? Yes  Focused Assessment No change from prior assessment  Early Detection of Sepsis Score *See Row Information* Low  MEWS guidelines implemented *See Row Information* No, previously yellow, continue vital signs every 4 hours  Document  Patient Outcome Stabilized after interventions  Progress note created (see row info) Yes  Assess: SIRS CRITERIA  SIRS Temperature  0  SIRS Pulse 1  SIRS Respirations  0  SIRS WBC 0  SIRS Score Sum  1

## 2020-08-30 NOTE — Progress Notes (Signed)
Heart Failure Stewardship Pharmacist Progress Note   PCP: Marrian Salvage, FNP PCP-Cardiologist: Donato Heinz, MD    HPI:  86 yo F w/ PMH of HTN, HLD, remote light tobacco use, and breast cancer on letrozole who presented to [facility] ED 6/4 complaining of severe worsening SOB. CXR showed no edema, CT revealed consolidation in right lower lobe. Diuresed w/ IV Lasix 40mg  and out 1L. BNP 2105. ECHO 6/5 revealed EF 20-25% and severe AS. Elevated troponin (380) and underwent cath 6/6 revealing minimal CAD with severe AS. RA 8/6, PAP 56/15, PCWP 15, LVEDP 38, CI 2.1. Notes allergy to losartan and olmesartan w/ SOB.   Current HF Medications: - Lasix 40mg  PO daily - Jardiance 10mg  daily - Potassium 46mEq daily   Prior to admission HF Medications: - None  Pertinent Lab Values: Serum creatinine 1.12, BUN 18, Potassium 3.8, Sodium 137, BNP 2105 Vital Signs: Weight: 61kg (admission weight: 56.7kg) Blood pressure: 90s/60s Heart rate: 90-100s  Medication Assistance / Insurance Benefits Check: Does the patient have prescription insurance?  Yes Type of insurance plan: [employer]  Does the patient qualify for medication assistance through manufacturers or grants?   Yes - can utilize copay cards   Outpatient Pharmacy:  [facility] admission [facility] Is the patient willing to use [facility] pharmacy at discharge? Yes Is the patient willing to transition their outpatient pharmacy to utilize a [facility] outpatient pharmacy?   Pending    Assessment: 1. New onset systolic CHF (EF 81-27%), due to NICM. NYHA class III-IV. - No edema on exam - Desires to stay in hospital until TAVR, scheduled for 6/14 - Low BP limits options at this time - Continue furosemide 40 mg PO daily - Continue Jardiance 10 mg daily - K was 3.4 on 6/10, started on daily potassium 10mEq   Plan: 1) Medication changes recommended at this time: - Continue current regimen -  TAVR 6/14  2) Patient assistance: - Jardiance PA approved through 08/24/2021, co-pay will be $27.32/mo - Co-pay card will decrease cost to $10/1-5mo  3)  Education  - Patient has been educated on current HF medications (Lasix and Jardiance). - Patient verbalizes understanding that over the next few months, these medication doses may change and more medications may be added to optimize HF regimen - Patient has been educated on basic disease state pathophysiology and goals of therapy - Time spent (65min) - HF TOC appt rescheduled for 6/21 given TAVR next week   Marya Fossa (Sean) [place], PharmD Student

## 2020-08-30 NOTE — Progress Notes (Signed)
ABG on room air drawn x1 attempt. Pt tolerated well. Lab notified of specimen being sent down at 1610. RT will continue to monitor and be available as needed.

## 2020-08-31 ENCOUNTER — Encounter (HOSPITAL_COMMUNITY): Payer: Federal, State, Local not specified - PPO

## 2020-08-31 ENCOUNTER — Encounter: Payer: Self-pay | Admitting: Physician Assistant

## 2020-08-31 ENCOUNTER — Inpatient Hospital Stay (HOSPITAL_COMMUNITY): Payer: Medicare Other

## 2020-08-31 ENCOUNTER — Encounter (HOSPITAL_COMMUNITY): Payer: Self-pay | Admitting: Internal Medicine

## 2020-08-31 ENCOUNTER — Encounter (HOSPITAL_COMMUNITY)
Admission: EM | Disposition: A | Discharge status: Home Health | Payer: Federal, State, Local not specified - PPO | Source: Home / Self Care | Attending: Family Medicine

## 2020-08-31 DIAGNOSIS — Z952 Presence of prosthetic heart valve: Secondary | ICD-10-CM | POA: Insufficient documentation

## 2020-08-31 DIAGNOSIS — I5043 Acute on chronic combined systolic (congestive) and diastolic (congestive) heart failure: Secondary | ICD-10-CM

## 2020-08-31 DIAGNOSIS — I350 Nonrheumatic aortic (valve) stenosis: Secondary | ICD-10-CM

## 2020-08-31 DIAGNOSIS — Z006 Encounter for examination for normal comparison and control in clinical research program: Secondary | ICD-10-CM

## 2020-08-31 HISTORY — DX: Presence of prosthetic heart valve: Z95.2

## 2020-08-31 HISTORY — PX: ULTRASOUND GUIDANCE FOR VASCULAR ACCESS: SHX6516

## 2020-08-31 HISTORY — PX: INTRAOPERATIVE TRANSTHORACIC ECHOCARDIOGRAM: SHX6523

## 2020-08-31 HISTORY — PX: TRANSCATHETER AORTIC VALVE REPLACEMENT, TRANSFEMORAL: SHX6400

## 2020-08-31 LAB — ECHOCARDIOGRAM LIMITED
AR max vel: 2.2 cm2
AV Area VTI: 2.21 cm2
AV Area mean vel: 2.31 cm2
AV Mean grad: 2.5 mmHg
AV Peak grad: 5.7 mmHg
Ao pk vel: 1.19 m/s
Height: 59 in
Weight: 2042.34 oz

## 2020-08-31 LAB — BASIC METABOLIC PANEL
Anion gap: 9 (ref 5–15)
BUN: 19 mg/dL (ref 8–23)
CO2: 20 mmol/L — ABNORMAL LOW (ref 22–32)
Calcium: 8.8 mg/dL — ABNORMAL LOW (ref 8.9–10.3)
Chloride: 106 mmol/L (ref 98–111)
Creatinine, Ser: 1.04 mg/dL — ABNORMAL HIGH (ref 0.44–1.00)
GFR, Estimated: 53 mL/min — ABNORMAL LOW (ref >60)
Glucose, Bld: 101 mg/dL — ABNORMAL HIGH (ref 70–99)
Potassium: 4.1 mmol/L (ref 3.5–5.1)
Sodium: 135 mmol/L (ref 135–145)

## 2020-08-31 LAB — CBC
HCT: 38.4 % (ref 36.0–46.0)
Hemoglobin: 12.4 g/dL (ref 12.0–15.0)
MCH: 29.2 pg (ref 26.0–34.0)
MCHC: 32.3 g/dL (ref 30.0–36.0)
MCV: 90.4 fL (ref 80.0–100.0)
Platelets: 265 10*3/uL (ref 150–400)
RBC: 4.25 MIL/uL (ref 3.87–5.11)
RDW: 12.5 % (ref 11.5–15.5)
WBC: 5.3 10*3/uL (ref 4.0–10.5)
nRBC: 0 % (ref 0.0–0.2)

## 2020-08-31 LAB — HEMOGLOBIN A1C
Hgb A1c MFr Bld: 5.6 % (ref 4.8–5.6)
Mean Plasma Glucose: 114 mg/dL

## 2020-08-31 SURGERY — IMPLANTATION, AORTIC VALVE, TRANSCATHETER, FEMORAL APPROACH
Anesthesia: Monitor Anesthesia Care | Site: Groin

## 2020-08-31 MED ORDER — MIDAZOLAM HCL 2 MG/2ML IJ SOLN
INTRAMUSCULAR | Status: AC
  Filled 2020-08-31: qty 2

## 2020-08-31 MED ORDER — SODIUM CHLORIDE 0.9 % IV SOLN: 250.0000 mL | INTRAVENOUS | Status: DC | PRN

## 2020-08-31 MED ORDER — AMIODARONE HCL IN DEXTROSE 360-4.14 MG/200ML-% IV SOLN
60.0000 mg/h | INTRAVENOUS | Status: DC
  Administered 2020-08-31: 60 mg/h via INTRAVENOUS
  Filled 2020-08-31: qty 200

## 2020-08-31 MED ORDER — SODIUM CHLORIDE 0.9% FLUSH: 3.0000 mL | INTRAVENOUS | Status: DC | PRN

## 2020-08-31 MED ORDER — AMIODARONE IV BOLUS ONLY 150 MG/100ML
INTRAVENOUS | Status: DC | PRN
  Administered 2020-08-31: 150 mg via INTRAVENOUS

## 2020-08-31 MED ORDER — AMIODARONE HCL IN DEXTROSE 360-4.14 MG/200ML-% IV SOLN
INTRAVENOUS | Status: DC | PRN
  Administered 2020-08-31: 60 mg/h via INTRAVENOUS

## 2020-08-31 MED ORDER — ONDANSETRON HCL 4 MG/2ML IJ SOLN
INTRAMUSCULAR | Status: AC
  Filled 2020-08-31: qty 2

## 2020-08-31 MED ORDER — SODIUM CHLORIDE 0.9 % IV SOLN: INTRAVENOUS | Status: DC

## 2020-08-31 MED ORDER — CEFAZOLIN SODIUM-DEXTROSE 2-4 GM/100ML-% IV SOLN
2.0000 g | Freq: Three times a day (TID) | INTRAVENOUS | Status: AC
  Administered 2020-08-31 – 2020-09-01 (×2): 2 g via INTRAVENOUS
  Filled 2020-08-31 (×2): qty 100

## 2020-08-31 MED ORDER — HEPARIN SODIUM (PORCINE) 1000 UNIT/ML IJ SOLN
INTRAMUSCULAR | Status: DC | PRN
  Administered 2020-08-31: 7000 [IU] via INTRAVENOUS

## 2020-08-31 MED ORDER — CHLORHEXIDINE GLUCONATE CLOTH 2 % EX PADS
6.0000 | MEDICATED_PAD | Freq: Every day | CUTANEOUS | Status: DC
  Administered 2020-08-31 – 2020-09-03 (×5): 6 via TOPICAL

## 2020-08-31 MED ORDER — FENTANYL CITRATE (PF) 250 MCG/5ML IJ SOLN
INTRAMUSCULAR | Status: DC | PRN
  Administered 2020-08-31 (×2): 25 ug via INTRAVENOUS

## 2020-08-31 MED ORDER — ORAL CARE MOUTH RINSE: 15.0000 mL | Freq: Once | OROMUCOSAL | Status: AC

## 2020-08-31 MED ORDER — CHLORHEXIDINE GLUCONATE 0.12 % MT SOLN
OROMUCOSAL | Status: AC
  Administered 2020-08-31: 15 mL via OROMUCOSAL
  Filled 2020-08-31: qty 15

## 2020-08-31 MED ORDER — IODIXANOL 320 MG/ML IV SOLN
INTRAVENOUS | Status: DC | PRN
  Administered 2020-08-31: 28 mL

## 2020-08-31 MED ORDER — HEPARIN SODIUM (PORCINE) 1000 UNIT/ML IJ SOLN
INTRAMUSCULAR | Status: AC
  Filled 2020-08-31: qty 1

## 2020-08-31 MED ORDER — PROPOFOL 500 MG/50ML IV EMUL
INTRAVENOUS | Status: DC | PRN
  Administered 2020-08-31: 10 ug/kg/min via INTRAVENOUS
  Administered 2020-08-31: 10 mg via INTRAVENOUS

## 2020-08-31 MED ORDER — SODIUM CHLORIDE 0.9 % IV SOLN
INTRAVENOUS | Status: DC | PRN
  Administered 2020-08-31 (×3): 500 mL

## 2020-08-31 MED ORDER — OXYCODONE HCL 5 MG PO TABS: 5.0000 mg | ORAL_TABLET | ORAL | Status: DC | PRN

## 2020-08-31 MED ORDER — ACETAMINOPHEN 325 MG PO TABS
650.0000 mg | ORAL_TABLET | Freq: Four times a day (QID) | ORAL | Status: DC | PRN
  Filled 2020-08-31: qty 2

## 2020-08-31 MED ORDER — MORPHINE SULFATE (PF) 2 MG/ML IV SOLN: 1.0000 mg | INTRAVENOUS | Status: DC | PRN

## 2020-08-31 MED ORDER — EPINEPHRINE 1 MG/10ML IJ SOSY
PREFILLED_SYRINGE | INTRAMUSCULAR | Status: DC | PRN
  Administered 2020-08-31: 100 ug via INTRAVENOUS

## 2020-08-31 MED ORDER — CLOPIDOGREL BISULFATE 75 MG PO TABS
75.0000 mg | ORAL_TABLET | Freq: Every day | ORAL | Status: DC
  Administered 2020-09-01 – 2020-09-03 (×3): 75 mg via ORAL
  Filled 2020-08-31 (×3): qty 1

## 2020-08-31 MED ORDER — ONDANSETRON HCL 4 MG/2ML IJ SOLN
4.0000 mg | Freq: Four times a day (QID) | INTRAMUSCULAR | Status: DC | PRN
  Filled 2020-08-31: qty 2

## 2020-08-31 MED ORDER — SODIUM CHLORIDE 0.9% FLUSH
3.0000 mL | Freq: Two times a day (BID) | INTRAVENOUS | Status: DC
  Administered 2020-09-01: 3 mL via INTRAVENOUS

## 2020-08-31 MED ORDER — CHLORHEXIDINE GLUCONATE 0.12 % MT SOLN: 15.0000 mL | Freq: Once | OROMUCOSAL | Status: AC

## 2020-08-31 MED ORDER — FENTANYL CITRATE (PF) 250 MCG/5ML IJ SOLN
INTRAMUSCULAR | Status: AC
  Filled 2020-08-31: qty 5

## 2020-08-31 MED ORDER — NOREPINEPHRINE 4 MG/250ML-% IV SOLN: 0.0000 ug/min | INTRAVENOUS | Status: DC

## 2020-08-31 MED ORDER — SODIUM CHLORIDE 0.9 % IV SOLN: 250.0000 mL | INTRAVENOUS | Status: DC

## 2020-08-31 MED ORDER — LACTATED RINGERS IV SOLN: INTRAVENOUS | Status: DC

## 2020-08-31 MED ORDER — PROPOFOL 1000 MG/100ML IV EMUL
INTRAVENOUS | Status: AC
  Filled 2020-08-31: qty 100

## 2020-08-31 MED ORDER — CHLORHEXIDINE GLUCONATE CLOTH 2 % EX PADS: 6.0000 | MEDICATED_PAD | Freq: Every day | CUTANEOUS | Status: DC

## 2020-08-31 MED ORDER — LIDOCAINE HCL 1 % IJ SOLN
INTRAMUSCULAR | Status: AC
  Filled 2020-08-31: qty 20

## 2020-08-31 MED ORDER — NITROGLYCERIN IN D5W 200-5 MCG/ML-% IV SOLN: 0.0000 ug/min | INTRAVENOUS | Status: DC

## 2020-08-31 MED ORDER — ESMOLOL HCL 100 MG/10ML IV SOLN
INTRAVENOUS | Status: AC
  Filled 2020-08-31: qty 10

## 2020-08-31 MED ORDER — ESMOLOL HCL 100 MG/10ML IV SOLN
INTRAVENOUS | Status: DC | PRN
  Administered 2020-08-31: 20 mg via INTRAVENOUS

## 2020-08-31 MED ORDER — DEXAMETHASONE SODIUM PHOSPHATE 10 MG/ML IJ SOLN
INTRAMUSCULAR | Status: AC
  Filled 2020-08-31: qty 1

## 2020-08-31 MED ORDER — PHENYLEPHRINE 40 MCG/ML (10ML) SYRINGE FOR IV PUSH (FOR BLOOD PRESSURE SUPPORT)
PREFILLED_SYRINGE | INTRAVENOUS | Status: DC | PRN
  Administered 2020-08-31: 80 ug via INTRAVENOUS

## 2020-08-31 MED ORDER — DEXAMETHASONE SODIUM PHOSPHATE 10 MG/ML IJ SOLN
INTRAMUSCULAR | Status: DC | PRN
  Administered 2020-08-31: 5 mg via INTRAVENOUS

## 2020-08-31 MED ORDER — IOHEXOL 300 MG/ML  SOLN
INTRAMUSCULAR | Status: DC | PRN
  Administered 2020-08-31: 10 mL

## 2020-08-31 MED ORDER — PROTAMINE SULFATE 10 MG/ML IV SOLN
INTRAVENOUS | Status: DC | PRN
  Administered 2020-08-31: 70 mg via INTRAVENOUS

## 2020-08-31 MED ORDER — ACETAMINOPHEN 650 MG RE SUPP: 650.0000 mg | Freq: Four times a day (QID) | RECTAL | Status: DC | PRN

## 2020-08-31 MED ORDER — ONDANSETRON HCL 4 MG/2ML IJ SOLN
INTRAMUSCULAR | Status: DC | PRN
  Administered 2020-08-31: 4 mg via INTRAVENOUS

## 2020-08-31 MED ORDER — PHENYLEPHRINE HCL-NACL 20-0.9 MG/250ML-% IV SOLN: 0.0000 ug/min | INTRAVENOUS | Status: DC

## 2020-08-31 MED ORDER — NOREPINEPHRINE 4 MG/250ML-% IV SOLN
2.0000 ug/min | INTRAVENOUS | Status: DC
  Administered 2020-08-31: 3 ug/min via INTRAVENOUS

## 2020-08-31 MED ORDER — TRAMADOL HCL 50 MG PO TABS: 50.0000 mg | ORAL_TABLET | Freq: Two times a day (BID) | ORAL | Status: DC | PRN

## 2020-08-31 MED ORDER — AMIODARONE HCL IN DEXTROSE 360-4.14 MG/200ML-% IV SOLN
30.0000 mg/h | INTRAVENOUS | Status: DC
  Administered 2020-09-01 (×2): 30 mg/h via INTRAVENOUS
  Filled 2020-08-31: qty 200

## 2020-08-31 MED ORDER — LIDOCAINE HCL 1 % IJ SOLN
INTRAMUSCULAR | Status: DC | PRN
  Administered 2020-08-31: 8 mL

## 2020-08-31 SURGICAL SUPPLY — 77 items
ADH SKN CLS APL DERMABOND .7 (GAUZE/BANDAGES/DRESSINGS) ×4
APL PRP STRL LF DISP 70% ISPRP (MISCELLANEOUS) ×4
BAG DECANTER FOR FLEXI CONT (MISCELLANEOUS) IMPLANT
BAG SNAP BAND KOVER 36X36 (MISCELLANEOUS) ×6 IMPLANT
BLADE CLIPPER SURG (BLADE) IMPLANT
BLADE STERNUM SYSTEM 6 (BLADE) IMPLANT
BLADE SURG 10 STRL SS (BLADE) IMPLANT
CABLE ADAPT CONN TEMP 6FT (ADAPTER) ×6 IMPLANT
CANISTER SUCT 3000ML PPV (MISCELLANEOUS) IMPLANT
CATH DIAG EXPO 6F AL1 (CATHETERS) ×3 IMPLANT
CATH DIAG EXPO 6F VENT PIG 145 (CATHETERS) ×12 IMPLANT
CATH INFINITI 6F AL2 (CATHETERS) ×3 IMPLANT
CATH S G BIP PACING (CATHETERS) ×6 IMPLANT
CHLORAPREP W/TINT 26 (MISCELLANEOUS) ×6 IMPLANT
CLOSURE MYNX CONTROL 6F/7F (Vascular Products) ×3 IMPLANT
CNTNR URN SCR LID CUP LEK RST (MISCELLANEOUS) ×8 IMPLANT
CONT SPEC 4OZ STRL OR WHT (MISCELLANEOUS) ×12
COVER BACK TABLE 80X110 HD (DRAPES) IMPLANT
DECANTER SPIKE VIAL GLASS SM (MISCELLANEOUS) ×6 IMPLANT
DERMABOND ADVANCED (GAUZE/BANDAGES/DRESSINGS) ×2
DERMABOND ADVANCED .7 DNX12 (GAUZE/BANDAGES/DRESSINGS) ×4 IMPLANT
DEVICE CLOSURE PERCLS PRGLD 6F (VASCULAR PRODUCTS) ×8 IMPLANT
DRAPE INCISE IOBAN 66X45 STRL (DRAPES) IMPLANT
DRSG TEGADERM 4X4.75 (GAUZE/BANDAGES/DRESSINGS) ×12 IMPLANT
ELECT CAUTERY BLADE 6.4 (BLADE) IMPLANT
ELECT REM PT RETURN 9FT ADLT (ELECTROSURGICAL) ×12
ELECTRODE REM PT RTRN 9FT ADLT (ELECTROSURGICAL) ×8 IMPLANT
GAUZE SPONGE 4X4 12PLY STRL (GAUZE/BANDAGES/DRESSINGS) ×9 IMPLANT
GLOVE BIO SURGEON STRL SZ7.5 (GLOVE) IMPLANT
GLOVE BIO SURGEON STRL SZ8 (GLOVE) IMPLANT
GLOVE EUDERMIC 7 POWDERFREE (GLOVE) IMPLANT
GLOVE ORTHO TXT STRL SZ7.5 (GLOVE) IMPLANT
GOWN STRL REUS W/ TWL LRG LVL3 (GOWN DISPOSABLE) IMPLANT
GOWN STRL REUS W/ TWL XL LVL3 (GOWN DISPOSABLE) ×4 IMPLANT
GOWN STRL REUS W/TWL LRG LVL3 (GOWN DISPOSABLE)
GOWN STRL REUS W/TWL XL LVL3 (GOWN DISPOSABLE) ×6
GUIDEWIRE SAF TJ AMPL .035X180 (WIRE) ×6 IMPLANT
GUIDEWIRE SAFE TJ AMPLATZ EXST (WIRE) ×6 IMPLANT
KIT BASIN OR (CUSTOM PROCEDURE TRAY) ×6 IMPLANT
KIT HEART LEFT (KITS) ×6 IMPLANT
KIT SUCTION CATH 14FR (SUCTIONS) IMPLANT
KIT TURNOVER KIT B (KITS) ×6 IMPLANT
NS IRRIG 1000ML POUR BTL (IV SOLUTION) ×6 IMPLANT
PACK ENDO MINOR (CUSTOM PROCEDURE TRAY) ×6 IMPLANT
PAD ARMBOARD 7.5X6 YLW CONV (MISCELLANEOUS) ×12 IMPLANT
PAD ELECT DEFIB RADIOL ZOLL (MISCELLANEOUS) ×6 IMPLANT
PENCIL BUTTON HOLSTER BLD 10FT (ELECTRODE) IMPLANT
PERCLOSE PROGLIDE 6F (VASCULAR PRODUCTS) ×12
POSITIONER HEAD DONUT 9IN (MISCELLANEOUS) ×6 IMPLANT
SET MICROPUNCTURE 5F STIFF (MISCELLANEOUS) ×6 IMPLANT
SHEATH BRITE TIP 7FR 35CM (SHEATH) ×6 IMPLANT
SHEATH PINNACLE 6F 10CM (SHEATH) ×6 IMPLANT
SHEATH PINNACLE 8F 10CM (SHEATH) ×6 IMPLANT
SLEEVE REPOSITIONING LENGTH 30 (MISCELLANEOUS) ×6 IMPLANT
STOPCOCK MORSE 400PSI 3WAY (MISCELLANEOUS) ×12 IMPLANT
SUT ETHIBOND X763 2 0 SH 1 (SUTURE) IMPLANT
SUT GORETEX CV 4 TH 22 36 (SUTURE) IMPLANT
SUT GORETEX CV4 TH-18 (SUTURE) IMPLANT
SUT MNCRL AB 3-0 PS2 18 (SUTURE) IMPLANT
SUT PROLENE 5 0 C 1 36 (SUTURE) IMPLANT
SUT PROLENE 6 0 C 1 30 (SUTURE) IMPLANT
SUT SILK  1 MH (SUTURE) ×12
SUT SILK 1 MH (SUTURE) ×5 IMPLANT
SUT VIC AB 2-0 CT1 27 (SUTURE)
SUT VIC AB 2-0 CT1 TAPERPNT 27 (SUTURE) IMPLANT
SUT VIC AB 2-0 CTX 36 (SUTURE) IMPLANT
SUT VIC AB 3-0 SH 8-18 (SUTURE) IMPLANT
SYR 50ML LL SCALE MARK (SYRINGE) ×6 IMPLANT
SYR BULB IRRIG 60ML STRL (SYRINGE) IMPLANT
SYR MEDRAD MARK V 150ML (SYRINGE) ×6 IMPLANT
TOWEL GREEN STERILE (TOWEL DISPOSABLE) ×12 IMPLANT
TRANSDUCER W/STOPCOCK (MISCELLANEOUS) ×12 IMPLANT
TRAY FOLEY SLVR 14FR TEMP STAT (SET/KITS/TRAYS/PACK) IMPLANT
TUBE SUCT INTRACARD DLP 20F (MISCELLANEOUS) IMPLANT
VALVE 23 ULTRA SAPIEN KIT (Valve) ×3 IMPLANT
WIRE EMERALD 3MM-J .035X150CM (WIRE) ×6 IMPLANT
WIRE EMERALD 3MM-J .035X260CM (WIRE) ×6 IMPLANT

## 2020-08-31 NOTE — Progress Notes (Signed)
Patient ID: Alexandria Randolph, female   DOB: [birth year], 86 y.o.   MRN: [id number] TCTS Evening Rounds:  Hemodynamically stable in sinus rhythm on IV amio. Pacer on VVI backup  She is sleepy.  Groin sites ok.

## 2020-08-31 NOTE — Anesthesia Postprocedure Evaluation (Signed)
Anesthesia Post Note  Patient: Alexandria Randolph  Procedure(s) Performed: TRANSCATHETER AORTIC VALVE REPLACEMENT, TRANSFEMORAL (Chest) INTRAOPERATIVE TRANSTHORACIC ECHOCARDIOGRAM (Left: Chest) ULTRASOUND GUIDANCE FOR VASCULAR ACCESS (Bilateral: Groin)     Patient location during evaluation: SICU Anesthesia Type: MAC Level of consciousness: lethargic Pain management: pain level controlled Vital Signs Assessment: post-procedure vital signs reviewed and stable Respiratory status: spontaneous breathing and respiratory function stable Cardiovascular status: stable Postop Assessment: no apparent nausea or vomiting Anesthetic complications: no   No notable events documented.  Last Vitals:  Vitals:   08/31/20 1643 08/31/20 1715  BP:  124/76  Pulse: 77 85  Resp:  17  Temp:    SpO2:  100%    Last Pain:  Vitals:   08/31/20 1323  TempSrc:   PainSc: 0-No pain                 Luceil Herrin DANIEL

## 2020-08-31 NOTE — Anesthesia Procedure Notes (Signed)
Procedure Name: MAC Date/Time: 08/31/2020 2:54 PM Performed by: Sherlie Ban, RN Pre-anesthesia Checklist: Patient identified, Emergency Drugs available, Suction available, Patient being monitored and Timeout performed Patient Re-evaluated:Patient Re-evaluated prior to induction Oxygen Delivery Method: Nasal cannula Induction Type: IV induction Placement Confirmation: positive ETCO2 Dental Injury: Teeth and Oropharynx as per pre-operative assessment

## 2020-08-31 NOTE — Op Note (Signed)
HEART AND VASCULAR CENTER   MULTIDISCIPLINARY HEART VALVE TEAM   TAVR OPERATIVE NOTE   Date of Procedure:  08/31/2020  Preoperative Diagnosis: Severe Aortic Stenosis   Postoperative Diagnosis: Same   Procedure:   Transcatheter Aortic Valve Replacement - Percutaneous Transfemoral Approach  Edwards Sapien 3 Ultra THV (size 23 mm, model # 9750TFX, serial # [id number])   Co-Surgeons:  Gaye Pollack, MD and Sherren Mocha, MD  Anesthesiologist:  Duane Boston, MD  Echocardiographer:  [name], MD  Pre-operative Echo Findings: Critical aortic stenosis Severe global left ventricular systolic dysfunction  Post-operative Echo Findings: No paravalvular leak Unchanged left ventricular systolic function  BRIEF CLINICAL NOTE AND INDICATIONS FOR SURGERY  86 year old woman presents with acute systolic heart failure and found to have critical aortic stenosis.  She required IV diuresis and ongoing hospital care because of tenuous fluid balance and persistent symptoms of dyspnea and weakness.  After multidisciplinary heart team review and extensive testing including 2D echo, CT angio studies of the heart and the chest, abdomen, and pelvis, and right and left heart catheterization, TAVR is indicated in this high risk surgical patient.  During the course of the patient's preoperative work up they have been evaluated comprehensively by a multidisciplinary team of specialists coordinated through the [facility] in the [place] [facility].  They have been demonstrated to suffer from symptomatic severe aortic stenosis as noted above. The patient has been counseled extensively as to the relative risks and benefits of all options for the treatment of severe aortic stenosis including long term medical therapy, conventional surgery for aortic valve replacement, and transcatheter aortic valve replacement.  The patient has been independently evaluated in formal  cardiac surgical consultation by Dr Cyndia Bent, who deemed the patient appropriate for TAVR. Based upon review of all of the patient's preoperative diagnostic tests they are felt to be candidate for transcatheter aortic valve replacement using the transfemoral approach as an alternative to conventional surgery.    Following the decision to proceed with transcatheter aortic valve replacement, a discussion has been held regarding what types of management strategies would be attempted intraoperatively in the event of life-threatening complications, including whether or not the patient would be considered a candidate for the use of cardiopulmonary bypass and/or conversion to open sternotomy for attempted surgical intervention.  The patient has been advised of a variety of complications that might develop peculiar to this approach including but not limited to risks of death, stroke, paravalvular leak, aortic dissection or other major vascular complications, aortic annulus rupture, device embolization, cardiac rupture or perforation, acute myocardial infarction, arrhythmia, heart block or bradycardia requiring permanent pacemaker placement, congestive heart failure, respiratory failure, renal failure, pneumonia, infection, other late complications related to structural valve deterioration or migration, or other complications that might ultimately cause a temporary or permanent loss of functional independence or other long term morbidity.  The patient provides full informed consent for the procedure as described and all questions were answered preoperatively.  DETAILS OF THE OPERATIVE PROCEDURE  PREPARATION:   The patient is brought to the operating room on the above mentioned date and central monitoring was established by the anesthesia team including placement of a central venous catheter and radial arterial line. The patient is placed in the supine position on the operating table.  Intravenous antibiotics are  administered. The patient is monitored closely throughout the procedure under conscious sedation.  Baseline transthoracic echocardiogram is performed. The patient's chest, abdomen, both groins, and both lower  extremities are prepared and draped in a sterile manner. A time out procedure is performed.   PERIPHERAL ACCESS:   Using ultrasound guidance, femoral arterial and venous access is obtained with placement of 6 Fr sheaths on the left side.  A pigtail diagnostic catheter was passed through the femoral arterial sheath under fluoroscopic guidance into the aortic root.  A temporary transvenous pacemaker catheter was passed through the femoral venous sheath under fluoroscopic guidance into the right ventricle.  The pacemaker was tested to ensure stable lead placement and pacemaker capture. Aortic root angiography was performed in order to determine the optimal angiographic angle for valve deployment.  TRANSFEMORAL ACCESS:  A micropuncture technique is used to access the right femoral artery under fluoroscopic and ultrasound guidance.  2 Perclose devices are deployed at 10' and 2' positions to 'PreClose' the femoral artery. An 8 French sheath is placed and then an Amplatz Superstiff wire is advanced through the sheath. This is changed out for a 14 French transfemoral E-Sheath after progressively dilating over the Superstiff wire.  An AL-2 catheter was used to direct a straight-tip exchange length wire across the native aortic valve into the left ventricle. This was exchanged out for a pigtail catheter and position was confirmed in the LV apex. Simultaneous LV and Ao pressures were recorded.  The pigtail catheter was exchanged for an Amplatz Extra-stiff wire in the LV apex.    BALLOON AORTIC VALVULOPLASTY:  Not performed  TRANSCATHETER HEART VALVE DEPLOYMENT:  An Edwards Sapien 3 transcatheter heart valve (size 23 mm) was prepared and crimped per manufacturer's guidelines, and the proper orientation of  the valve is confirmed on the [employer] delivery system. The valve was advanced through the introducer sheath using normal technique until in an appropriate position in the abdominal aorta beyond the sheath tip. The balloon was then retracted and using the fine-tuning wheel was centered on the valve. The valve was then advanced across the aortic arch using appropriate flexion of the catheter. The valve was carefully positioned across the aortic valve annulus. The Commander catheter was retracted using normal technique. Once final position of the valve has been confirmed by angiographic assessment, the valve is deployed while temporarily holding ventilation and during rapid ventricular pacing to maintain systolic blood pressure < 50 mmHg and pulse pressure < 10 mmHg. The balloon inflation is held for >3 seconds after reaching full deployment volume. Once the balloon has fully deflated the balloon is retracted into the ascending aorta and valve function is assessed using echocardiography.  As soon as the valve crossed the aortic valve annulus, the patient demonstrated marked hypotension.  The valve is quickly positioned and deployed to minimize time of severe hypotension.  Post deployment, the patient continued to have severe hypotension with systolic blood pressure less than 50 mmHg and required chest compressions as well as a 100 mcg bolus of epinephrine.  Once the epinephrine was circulated for about 1 minute, she regained normal rhythm and blood pressure.  The deployment balloon and guidewire are both removed. Echo demostrated acceptable post-procedural gradients, stable mitral valve function, and no aortic insufficiency.  The patient later developed atrial flutter with RVR then atrial flutter with 2: 1 conduction.  She is treated with IV amiodarone and synchronized cardioversion x1.   PROCEDURE COMPLETION:  The sheath was removed and femoral artery closure is performed using the 2 previously deployed  Perclose devices.  Protamine is administered once femoral arterial repair was complete. The site is clear with no evidence of  bleeding or hematoma after the sutures are tightened. The temporary pacemaker and pigtail catheters are removed. Mynx closure is used for contralateral femoral arterial hemostasis for the 6 Fr sheath.  The patient tolerated the procedure well and is transported to the surgical intensive care in stable condition. There were no immediate intraoperative complications. All sponge instrument and needle counts are verified correct at completion of the operation.   Sherren Mocha, MD 08/31/2020 4:51 PM

## 2020-08-31 NOTE — Transfer of Care (Signed)
Immediate Anesthesia Transfer of Care Note  Patient: Alexandria Randolph  Procedure(s) Performed: TRANSCATHETER AORTIC VALVE REPLACEMENT, TRANSFEMORAL (Chest) INTRAOPERATIVE TRANSTHORACIC ECHOCARDIOGRAM (Left: Chest) ULTRASOUND GUIDANCE FOR VASCULAR ACCESS (Bilateral: Groin)  Patient Location: ICU  Anesthesia Type:MAC  Level of Consciousness: unresponsive and obtunded  Airway & Oxygen Therapy: Patient Spontanous Breathing and Patient connected to nasal cannula oxygen  Post-op Assessment: Report given to RN and Post -op Vital signs reviewed and stable  Post vital signs: Reviewed and stable  Last Vitals:  Vitals Value Taken Time  BP 124/76 08/31/20 1715  Temp    Pulse 85 08/31/20 1715  Resp 17 08/31/20 1715  SpO2 100 % 08/31/20 1715  Vitals shown include unvalidated device data.  Last Pain:  Vitals:   08/31/20 1323  TempSrc:   PainSc: 0-No pain         Complications: No notable events documented.

## 2020-08-31 NOTE — Anesthesia Procedure Notes (Signed)
Arterial Line Insertion Start/End6/14/2022 2:10 PM, 08/31/2020 2:15 PM Performed by: CRNA  Patient location: Pre-op. Preanesthetic checklist: patient identified, IV checked, risks and benefits discussed, surgical consent, monitors and equipment checked, pre-op evaluation, timeout performed and anesthesia consent Lidocaine 1% used for infiltration Right, radial was placed Catheter size: 20 Fr Hand hygiene performed  and maximum sterile barriers used   Attempts: 1 Procedure performed without using ultrasound guided technique. Following insertion, dressing applied and Biopatch. Post procedure assessment: normal and unchanged  Patient tolerated the procedure well with no immediate complications. Additional procedure comments: Placed by Dorene Grebe, SRNA.Marland Kitchen

## 2020-08-31 NOTE — Progress Notes (Signed)
*  PRELIMINARY RESULTS* Echocardiogram 2D Echocardiogram has been performed.  Dustin Flock 08/31/2020, 4:21 PM

## 2020-08-31 NOTE — Progress Notes (Signed)
Progress Note  Patient Name: Alexandria Randolph Date of Encounter: 08/31/2020  [facility] Cardiologist: Donato Heinz, MD   Subjective   Dr. Cyndia Bent saw yesterday in consultation.  Awaiting TAVR.  No significant shortness of breath.  No chest pain.  Inpatient Medications    Scheduled Meds:  aspirin  81 mg Oral Daily   bisacodyl  5 mg Oral Once   empagliflozin  10 mg Oral Daily   furosemide  40 mg Oral Daily   magnesium sulfate  40 mEq Other To OR   potassium chloride  80 mEq Other To OR   potassium chloride  20 mEq Oral Daily   sodium chloride flush  3 mL Intravenous Q12H   sodium chloride flush  3 mL Intravenous Q12H   sodium chloride flush  3 mL Intravenous Q12H   Continuous Infusions:  sodium chloride      ceFAZolin (ANCEF) IV     dexmedetomidine     heparin 30,000 units/NS 1000 mL solution for CELLSAVER     norepinephrine (LEVOPHED) Adult infusion     PRN Meds: sodium chloride, acetaminophen **OR** acetaminophen, morphine injection, ondansetron (ZOFRAN) IV, polyethylene glycol, sodium chloride flush, temazepam   Vital Signs    Vitals:   08/30/20 1134 08/30/20 1949 08/31/20 0427 08/31/20 0558  BP: (!) 93/57 93/72 (!) 87/64   Pulse: 100 96 90   Resp: 18 18 18    Temp: 97.8 F (36.6 C) 98.2 F (36.8 C) 97.8 F (36.6 C)   TempSrc: Oral Oral Oral   SpO2: 99% 98% 99%   Weight:    57.9 kg  Height:        Intake/Output Summary (Last 24 hours) at 08/31/2020 0919 Last data filed at 08/31/2020 0853 Gross per 24 hour  Intake 477 ml  Output 50 ml  Net 427 ml   Last 3 Weights 08/31/2020 08/30/2020 08/29/2020  Weight (lbs) 127 lb 10.3 oz 134 lb 7.7 oz 132 lb 7.9 oz  Weight (kg) 57.9 kg 61 kg 60.1 kg      Telemetry    Sinus rhythm- Personally Reviewed  ECG    Sinus rhythm- Personally Reviewed  Physical Exam   GEN: No acute distress.  Elderly, pleasant Neck: No JVD Cardiac: RRR, 2/6 systolic murmur,no rubs, or gallops.  Respiratory: Clear to  auscultation bilaterally. GI: Soft, nontender, non-distended  MS: No edema; No deformity. Neuro:  Nonfocal  Psych: Normal affect   Labs    High Sensitivity Troponin:   Recent Labs  Lab 08/21/20 1558 08/21/20 1834  TROPONINIHS 372* 389*      Chemistry Recent Labs  Lab 08/28/20 0304 08/30/20 0901 08/31/20 0234  NA 138 137 135  K 3.9 3.8 4.1  CL 107 109 106  CO2 20* 17* 20*  GLUCOSE 107* 141* 101*  BUN 13 18 19   CREATININE 0.95 1.12* 1.04*  CALCIUM 8.9 9.2 8.8*  GFRNONAA 59* 48* 53*  ANIONGAP 11 11 9      Hematology Recent Labs  Lab 08/26/20 0239 08/31/20 0234  WBC 5.9 5.3  RBC 4.09 4.25  HGB 12.1 12.4  HCT 36.5 38.4  MCV 89.2 90.4  MCH 29.6 29.2  MCHC 33.2 32.3  RDW 12.2 12.5  PLT 216 265    BNPNo results for input(s): BNP, PROBNP in the last 168 hours.   DDimer No results for input(s): DDIMER in the last 168 hours.   Radiology    DG Chest 2 View  Result Date: 08/30/2020 CLINICAL DATA:  Preoperative  evaluation EXAM: CHEST - 2 VIEW COMPARISON:  08/21/2020 FINDINGS: Borderline enlargement of cardiac silhouette. Mediastinal contours and pulmonary vascularity normal. Atelectasis versus infiltrate at medial RIGHT lower lobe. Small LEFT pleural effusion. Remaining lungs clear. No pneumothorax. Bones demineralized with degenerative changes of the thoracic spine and LEFT glenohumeral joint. IMPRESSION: Borderline enlargement of cardiac silhouette with atelectasis versus consolidation in medial RIGHT lower lobe. Small LEFT pleural effusion. Electronically Signed   By: Lavonia Dana M.D.   On: 08/30/2020 17:09    Cardiac Studies     1. Left ventricular ejection fraction, by estimation, is 20 to 25%. The  left ventricle has severely decreased function. The left ventricle  demonstrates global hypokinesis. There is mild left ventricular  hypertrophy. Left ventricular diastolic parameters   are consistent with Grade II diastolic dysfunction (pseudonormalization).   Elevated left atrial pressure.   2. Right ventricular systolic function is mildly reduced. The right  ventricular size is normal. Tricuspid regurgitation signal is inadequate  for assessing PA pressure.   3. The mitral valve is normal in structure. Trivial mitral valve  regurgitation.   4. The aortic valve is calcified. There is severe calcifcation of the  aortic valve. Aortic valve regurgitation is trivial. Severe aortic valve  stenosis. Vmax 4.3 m/s, MG 49 mmHg, AVA 0.7 cm^2, DI 0.26   5. The inferior vena cava is normal in size with <50% respiratory  variability, suggesting right atrial pressure of 8 mmHg.   Diagnostic Dominance: Co-dominant           Patient Profile     86 y.o. female with severe aortic stenosis, low ejection fraction/systolic heart failure acute, now awaiting TAVR, no significant CAD on cath.  Assessment & Plan     Severe aortic stenosis - Reviewed Dr. Kyla Balzarine note, cardiac catheterization showed no CAD but severe aortic stenosis.  CT scan on 08/25/2020 demonstrated that she would be a good candidate for a 23 mm SAPIEN 3 valve.  Structural heart team have discussed with the OR team and can add her onto Tuesday schedule for fifth case TAVR.  She has been seen by Dr. Burt Knack.  Dr. Cyndia Bent saw yesterday. -Continuing daily diuretic although BP remains soft. -Echo EF 25% with diffuse hypokinesis and severe aortic stenosis -Telemetry demonstrates sinus rhythm -ECG shows sinus rhythm with left axis deviation chronic lateral ST segment changes.  Personally reviewed.   Chronic systolic heart failure - Diuresing based upon elevated left ventricular end-diastolic pressure.  Blood pressure has been soft.  Aortic stenosis playing a significant role.  Feels much better. -Continue with p.o. Lasix 40. --No changes today.       For questions or updates, please contact [place] Please consult [email or web address] for contact info under        Signed, Candee Furbish, MD   08/31/2020, 9:19 AM

## 2020-08-31 NOTE — Progress Notes (Signed)
   08/31/20 0938  Mobility  Activity Refused mobility (Declined for unspecified reasons)  Mobility Response RN notified

## 2020-08-31 NOTE — Anesthesia Preprocedure Evaluation (Addendum)
Anesthesia Evaluation  Patient identified by MRN, date of birth, ID band Patient awake    Reviewed: Allergy & Precautions, NPO status , Patient's Chart, lab work & pertinent test results  History of Anesthesia Complications Negative for: history of anesthetic complications  Airway Mallampati: II  TM Distance: >3 FB Neck ROM: Full    Dental  (+) Poor Dentition, Dental Advisory Given   Pulmonary asthma , former smoker,    Pulmonary exam normal        Cardiovascular hypertension, + Valvular Problems/Murmurs AS  Rhythm:Regular Rate:Normal + Systolic murmurs Impression Ms. Desrocher has minimal CAD with at most 51 to 40% ostial left  main. She has critical AS with a peak to peak gradient of 72 mmHg and a  valve area of 0.39 cm indexed to 0.25 cm/m. She has severe LV  dysfunction. She may be a good candidate for TAVR. Her LVEDP was  elevated at 38 and she received 40 mg of Lasix IV prior to leaving the  Cath Lab. Dr. Johnsie Cancel, her attending cardiologist, was notified of these  results. The sheaths were removed and MYNX closure devices were deployed  in both the right common femoral artery and vein achieving hemostasis.  The patient left lab in stable condition.  Quay Burow. MD, [facility]   Neuro/Psych negative neurological ROS     GI/Hepatic negative GI ROS, Neg liver ROS,   Endo/Other  negative endocrine ROS  Renal/GU negative Renal ROS     Musculoskeletal negative musculoskeletal ROS (+)   Abdominal   Peds  Hematology negative hematology ROS (+)   Anesthesia Other Findings   Reproductive/Obstetrics                            Anesthesia Physical Anesthesia Plan  ASA: 4  Anesthesia Plan: MAC   Post-op Pain Management:    Induction:   PONV Risk Score and Plan: 2 and Ondansetron and Propofol infusion  Airway Management Planned: Natural Airway, Simple Face Mask and Nasal  Cannula  Additional Equipment: Arterial line  Intra-op Plan:   Post-operative Plan:   Informed Consent: I have reviewed the patients History and Physical, chart, labs and discussed the procedure including the risks, benefits and alternatives for the proposed anesthesia with the patient or authorized representative who has indicated his/her understanding and acceptance.   Patient has DNR.  Discussed DNR with patient and Suspend DNR.   Dental advisory given  Plan Discussed with: Anesthesiologist and CRNA  Anesthesia Plan Comments:       Anesthesia Quick Evaluation

## 2020-08-31 NOTE — Op Note (Signed)
[facility]   MULTIDISCIPLINARY HEART VALVE TEAM   TAVR OPERATIVE NOTE   Date of Procedure:  08/31/2020  Preoperative Diagnosis: Severe Aortic Stenosis   Postoperative Diagnosis: Same   Procedure:   Transcatheter Aortic Valve Replacement - Percutaneous Right Transfemoral Approach  Edwards Sapien 3 Ultra THV (size 23 mm, model # 9750TFX, serial # [id number])   Co-Surgeons:  Gaye Pollack, MD and Sherren Mocha, MD     Anesthesiologist:  Tamela Gammon, MD  Echocardiographer:  [name], MD  Pre-operative Echo Findings: Severe aortic stenosis Severe left ventricular systolic dysfunction  Post-operative Echo Findings: No paravalvular leak Unchanged severe  left ventricular systolic dysfunction   BRIEF CLINICAL NOTE AND INDICATIONS FOR SURGERY   She was admitted with acute on chronic combined systolic and diastolic congestive heart failure with an LVEDP Of 38 and ejection fraction of 20 to 25% by echo.  The aortic valve mean gradient was 40.2 mmHg peak gradient of 66 mmHg and a valve area of 0.82 cm.  Cardiac catheterization showed mild nonsignificant coronary disease and a mean gradient across aortic valve of 52.6 mmHg consistent with severe aortic stenosis.  Her gated cardiac CTA shows anatomy suitable for transcatheter aortic valve replacement using a SAPIEN 3 valve.  Her abdominal and pelvic CTA shows adequate pelvic vascular anatomy to allow transfemoral insertion.  She had a CT angio of the chest rule out pulmonary embolism on admission which was negative but did show consolidation right lower lobe and to lesser extent posteriorly in the right upper lobe.  This was not significantly changed on her CTA of the chest done 4 days later although slightly more organized.  I suspect this is probably some pneumonia exacerbated by congestive heart failure.  I think it would be worthwhile treating her with a course of antibiotics postoperatively and then repeating her CT  scan in 2 to 3 months to rule out neoplasm.   We discussed complications that might develop including but not limited to risks of death, stroke, paravalvular leak, aortic dissection or other major vascular complications, aortic annulus rupture, device embolization, cardiac rupture or perforation, mitral regurgitation, acute myocardial infarction, arrhythmia, heart block or bradycardia requiring permanent pacemaker placement, congestive heart failure, respiratory failure, renal failure, pneumonia, infection, other late complications related to structural valve deterioration or migration, or other complications that might ultimately cause a temporary or permanent loss of functional independence or other long term morbidity. The patient provides full informed consent for the procedure as described and all questions were answered. She is currently a DNR but is agreeable to limited resuscitation during the procedure if needed but does not want to have her chest opened.    DETAILS OF THE OPERATIVE PROCEDURE  PREPARATION:    The patient was brought to the operating room on the above mentioned date and appropriate monitoring was established by the anesthesia team. The patient was placed in the supine position on the operating table.  Intravenous antibiotics were administered. The patient was monitored closely throughout the procedure under conscious sedation.    Baseline transthoracic echocardiogram was performed. The patient's abdomen and both groins were prepped and draped in a sterile manner. A time out procedure was performed.   PERIPHERAL ACCESS:    Using the modified Seldinger technique, femoral arterial and venous access was obtained with placement of 6 Fr sheaths on the left side.  A pigtail diagnostic catheter was passed through the left arterial sheath under fluoroscopic guidance into the aortic root.  A  temporary transvenous pacemaker catheter was passed through the left femoral venous sheath  under fluoroscopic guidance into the right ventricle.  The pacemaker was tested to ensure stable lead placement and pacemaker capture. Aortic root angiography was performed in order to determine the optimal angiographic angle for valve deployment.   TRANSFEMORAL ACCESS:   Percutaneous transfemoral access and sheath placement was performed using ultrasound guidance.  The right common femoral artery was cannulated using a micropuncture needle and appropriate location was verified using hand injection angiogram.  A pair of Abbott Perclose percutaneous closure devices were placed and a 6 French sheath replaced into the femoral artery.  The patient was heparinized systemically and ACT verified > 250 seconds.    A 14 Fr transfemoral E-sheath was introduced into the right common femoral artery after progressively dilating over an Amplatz superstiff wire. An AL-2 catheter was used to direct a straight-tip exchange length wire across the native aortic valve into the left ventricle. This was exchanged out for a pigtail catheter and position was confirmed in the LV apex. Simultaneous LV and Ao pressures were recorded.  The pigtail catheter was exchanged for an Amplatz Extra-stiff wire in the LV apex.     BALLOON AORTIC VALVULOPLASTY:   Not performed.   TRANSCATHETER HEART VALVE DEPLOYMENT:   An Edwards Sapien 3 Ultra transcatheter heart valve (size 23 mm) was prepared and crimped per manufacturer's guidelines, and the proper orientation of the valve is confirmed on the [employer] delivery system. The valve was advanced through the introducer sheath using normal technique until in an appropriate position in the abdominal aorta beyond the sheath tip. The balloon was then retracted and using the fine-tuning wheel was centered on the valve. The valve was then advanced across the aortic arch using appropriate flexion of the catheter. The valve was carefully positioned across the aortic valve annulus. The  Commander catheter was retracted using normal technique. Once final position of the valve has been confirmed by angiographic assessment, the valve is deployed while temporarily holding ventilation and during rapid ventricular pacing to maintain systolic blood pressure < 50 mmHg and pulse pressure < 10 mmHg. The balloon inflation is held for >3 seconds after reaching full deployment volume. As soon as the valve crossed the aortic valve annulus, the patient demonstrated marked hypotension.  The valve was quickly positioned and deployed to minimize the time of severe hypotension. Post deployment, the patient continued to have severe hypotension with systolic blood pressure less than 50 mmHg and required chest compressions as well as a 100 mcg bolus of epinephrine.  Once the epinephrine was circulated for about 1 minute, she regained normal rhythm and blood pressure. Once the balloon has fully deflated the balloon is retracted into the ascending aorta and valve function is assessed using echocardiography. There is felt to be no paravalvular leak and no central aortic insufficiency.  The deployment balloon and guidewire are both removed. The patient later developed atrial flutter with RVR then atrial flutter with 2: 1 conduction.  She is treated with IV amiodarone and synchronized cardioversion x1.   PROCEDURE COMPLETION:   The sheath was removed and femoral artery closure performed.  Protamine was administered once femoral arterial repair was complete. The temporary pacemaker was left in place due to a prolonged pause after she received a small dose of esmolol for her atrial flutter. The pigtail catheter and femoral sheath was removed with Mynx femoral closure for the left femoral artery.  The patient tolerated the procedure well and  is transported to the cardiac ICU  in stable condition. There were no immediate intraoperative complications. All sponge instrument and needle counts are verified correct at  completion of the operation.   No blood products were administered during the operation.  The patient received a total of 38 mL of intravenous contrast during the procedure.   Gaye Pollack, MD 08/31/2020

## 2020-08-31 NOTE — Progress Notes (Signed)
[facility]  PROGRESS NOTE  Alexandria Randolph EHM:[id number] DOB: December 15, 1934 DOA: 08/21/2020 PCP: Marrian Salvage, FNP   Brief HPI:   86 year old female with past medical history of breast cancer currently on letrozole, remote history of tobacco abuse came in 1 to 2 weeks history of shortness of breath on exertion.  2D echo showed EF of 25% and grade 2 diastolic heart failure with severe aortic stenosis with gradient of 40 mmHg.    Subjective   Patient seen and examined, denies shortness of breath.  Plan for TAVR today   Assessment/Plan:     Severe aortic stenosis -Patient presented with dyspnea on exertion -2D echo showed EF 25%; severe aortic stenosis -Underwent left and right heart catheterization; showed minimal CAD -Aortic valve area 0.39 cm, mean gradient 49 mmHg -Underwent CTA coronary morphology yesterday; deemed a good candidate for TAVR -Plan for TAVR today   New onset CHF -Both systolic, EF 20 to 36% and diastolic with grade 2 diastolic dysfunction -Likely from underlying severe aortic stenosis -Patient on furosemide 40 mg p.o. daily; cardiology recommended to continue Lasix despite soft BP -Continue aspirin, Farxiga   Hypokalemia -Has persistently low normal potassium due to diuresis from Lasix -Started on K-Dur 20 mg p.o. daily -Today potassium is 4.1   Elevated troponin -Troponin was elevated 372, 389 -Likely demand ischemia in the setting of new onset CHF due to severe aortic stenosis -Cardiac catheterization showed minimal CAD   Elevated D-dimer -CTA of chest was negative for pulm embolism -Venous duplex  negative for DVT   History of breast cancer -Femara on hold for now   Scheduled medications:    [MAR Hold] aspirin  81 mg Oral Daily   bisacodyl  5 mg Oral Once   [MAR Hold] empagliflozin  10 mg Oral Daily   [MAR Hold] furosemide  40 mg Oral Daily   magnesium sulfate  40 mEq Other To OR   potassium chloride  80 mEq Other To OR    [MAR Hold] potassium chloride  20 mEq Oral Daily   [MAR Hold] sodium chloride flush  3 mL Intravenous Q12H   [MAR Hold] sodium chloride flush  3 mL Intravenous Q12H   [MAR Hold] sodium chloride flush  3 mL Intravenous Q12H         Data Reviewed:   CBG:  No results for input(s): GLUCAP in the last 168 hours.  SpO2: 98 % O2 Flow Rate (L/min): 2 L/min    Vitals:   08/31/20 0427 08/31/20 0558 08/31/20 1123 08/31/20 1245  BP: (!) 87/64  (!) 88/67 97/71  Pulse: 90  92 86  Resp: 18  15   Temp: 97.8 F (36.6 C)  98.2 F (36.8 C)   TempSrc: Oral  Oral   SpO2: 99%  98%   Weight:  57.9 kg    Height:         Intake/Output Summary (Last 24 hours) at 08/31/2020 1304 Last data filed at 08/31/2020 1157 Gross per 24 hour  Intake 240 ml  Output 350 ml  Net -110 ml     06/12 1901 - 06/14 0700 In: 629 [P.O.:814] Out: 300 [Urine:300]  Filed Weights   08/29/20 0400 08/30/20 0600 08/31/20 0558  Weight: 60.1 kg 61 kg 57.9 kg    CBC:  Recent Labs  Lab 08/26/20 0239 08/31/20 0234  WBC 5.9 5.3  HGB 12.1 12.4  HCT 36.5 38.4  PLT 216 265  MCV 89.2 90.4  MCH 29.6 29.2  MCHC  33.2 32.3  RDW 12.2 12.5    Complete metabolic panel:  Recent Labs  Lab 08/26/20 0239 08/27/20 0320 08/28/20 0304 08/30/20 0901 08/30/20 1633 08/31/20 0234  NA 135 136 138 137  --  135  K 3.6 3.4* 3.9 3.8  --  4.1  CL 103 103 107 109  --  106  CO2 20* 22 20* 17*  --  20*  GLUCOSE 82 105* 107* 141*  --  101*  BUN 15 13 13 18   --  19  CREATININE 1.01* 0.92 0.95 1.12*  --  1.04*  CALCIUM 9.0 9.0 8.9 9.2  --  8.8*  INR  --   --   --   --  1.1  --   HGBA1C  --   --   --   --  5.6  --     No results for input(s): LIPASE, AMYLASE in the last 168 hours.  No results for input(s): CRP, DDIMER, BNP, PROCALCITON, SARSCOV2NAA in the last 168 hours.  Invalid input(s): LACTICACID    ------------------------------------------------------------------------------------------------------------------ No results for input(s): CHOL, HDL, LDLCALC, TRIG, CHOLHDL, LDLDIRECT in the last 72 hours.  Lab Results  Component Value Date   HGBA1C 5.6 08/30/2020   ------------------------------------------------------------------------------------------------------------------ No results for input(s): TSH, T4TOTAL, T3FREE, THYROIDAB in the last 72 hours.  Invalid input(s): FREET3 ------------------------------------------------------------------------------------------------------------------ No results for input(s): VITAMINB12, FOLATE, FERRITIN, TIBC, IRON, RETICCTPCT in the last 72 hours.  Coagulation profile Recent Labs  Lab 08/30/20 1633  INR 1.1   No results for input(s): DDIMER in the last 72 hours.  Cardiac Enzymes No results for input(s): CKTOTAL, CKMB, CKMBINDEX, TROPONINI in the last 168 hours.  ------------------------------------------------------------------------------------------------------------------    Component Value Date/Time   BNP 2,105.0 (H) 08/21/2020 0708     Antibiotics: Anti-infectives (From admission, onward)    Start     Dose/Rate Route Frequency Ordered Stop   08/31/20 0400  ceFAZolin (ANCEF) IVPB 2g/100 mL premix        2 g 200 mL/hr over 30 Minutes Intravenous To Surgery 08/30/20 0749 09/01/20 0400   08/22/20 1000  cefdinir (OMNICEF) capsule 300 mg  Status:  Discontinued        300 mg Oral Every 12 hours 08/22/20 0856 08/23/20 0719   08/22/20 1000  azithromycin (ZITHROMAX) tablet 500 mg  Status:  Discontinued        500 mg Oral Daily 08/22/20 0856 08/23/20 0719   08/21/20 1500  cefTRIAXone (ROCEPHIN) 1 g in sodium chloride 0.9 % 100 mL IVPB        1 g 200 mL/hr over 30 Minutes Intravenous  Once 08/21/20 1455 08/21/20 1540   08/21/20 1500  azithromycin (ZITHROMAX) 500 mg in sodium chloride 0.9 % 250 mL IVPB  Status:  Discontinued         500 mg 250 mL/hr over 60 Minutes Intravenous  Once 08/21/20 1455 08/21/20 1611        Radiology Reports  DG Chest 2 View  Result Date: 08/30/2020 CLINICAL DATA:  Preoperative evaluation EXAM: CHEST - 2 VIEW COMPARISON:  08/21/2020 FINDINGS: Borderline enlargement of cardiac silhouette. Mediastinal contours and pulmonary vascularity normal. Atelectasis versus infiltrate at medial RIGHT lower lobe. Small LEFT pleural effusion. Remaining lungs clear. No pneumothorax. Bones demineralized with degenerative changes of the thoracic spine and LEFT glenohumeral joint. IMPRESSION: Borderline enlargement of cardiac silhouette with atelectasis versus consolidation in medial RIGHT lower lobe. Small LEFT pleural effusion. Electronically Signed   By: Lavonia Dana M.D.   On: 08/30/2020  17:09       DVT prophylaxis: SCDs, start 08/25/2020-we will switch to Lovenox after TAVR  Code Status: DNR  Family Communication: No family at bedside   Consultants: Cardiology  Procedures:     Objective    Physical Examination:  General-appears in no acute distress Heart-S1-S2, regular, no murmur auscultated Lungs-bibasilar crackles Abdomen-soft, nontender, no organomegaly Extremities-no edema in the lower extremities Neuro-alert, oriented x3, no focal deficit noted  Status is: Inpatient  Dispo: The patient is from: Home              Anticipated d/c is to: Home              Anticipated d/c date is: 09/01/2020              Patient currently not stable for discharge  Barrier to discharge-plan for TAVR as per cardiology  COVID-19 Labs  No results for input(s): DDIMER, FERRITIN, LDH, CRP in the last 72 hours.  Lab Results  Component Value Date   [place] NEGATIVE 08/21/2020    Microbiology  Recent Results (from the past 240 hour(s))  Surgical pcr screen     Status: None   Collection Time: 08/30/20  4:22 PM   Specimen: Nasal Mucosa; Nasal Swab  Result Value Ref Range Status   MRSA, PCR  NEGATIVE NEGATIVE Final   Staphylococcus aureus NEGATIVE NEGATIVE Final    Comment: (NOTE) The Xpert SA Assay (FDA approved for NASAL specimens in patients 1 years of age and older), is one component of a comprehensive surveillance program. It is not intended to diagnose infection nor to guide or monitor treatment. Performed at [facility], [place] [address], [place], [place] [address]              [facility] If 7PM-7AM, please contact night-coverage at [email or web address], Office  [phone]   08/31/2020, 1:04 PM  LOS: 10 days

## 2020-09-01 ENCOUNTER — Inpatient Hospital Stay (HOSPITAL_COMMUNITY): Payer: Medicare Other

## 2020-09-01 ENCOUNTER — Encounter (HOSPITAL_COMMUNITY): Payer: Self-pay | Admitting: Internal Medicine

## 2020-09-01 DIAGNOSIS — Z952 Presence of prosthetic heart valve: Secondary | ICD-10-CM

## 2020-09-01 DIAGNOSIS — I5041 Acute combined systolic (congestive) and diastolic (congestive) heart failure: Secondary | ICD-10-CM

## 2020-09-01 LAB — BASIC METABOLIC PANEL
Anion gap: 9 (ref 5–15)
BUN: 14 mg/dL (ref 8–23)
CO2: 20 mmol/L — ABNORMAL LOW (ref 22–32)
Calcium: 8.4 mg/dL — ABNORMAL LOW (ref 8.9–10.3)
Chloride: 108 mmol/L (ref 98–111)
Creatinine, Ser: 0.83 mg/dL (ref 0.44–1.00)
GFR, Estimated: >60 mL/min (ref >60)
Glucose, Bld: 113 mg/dL — ABNORMAL HIGH (ref 70–99)
Potassium: 4.1 mmol/L (ref 3.5–5.1)
Sodium: 137 mmol/L (ref 135–145)

## 2020-09-01 LAB — CBC
HCT: 36.4 % (ref 36.0–46.0)
Hemoglobin: 11.5 g/dL — ABNORMAL LOW (ref 12.0–15.0)
MCH: 29 pg (ref 26.0–34.0)
MCHC: 31.6 g/dL (ref 30.0–36.0)
MCV: 91.7 fL (ref 80.0–100.0)
Platelets: 209 10*3/uL (ref 150–400)
RBC: 3.97 MIL/uL (ref 3.87–5.11)
RDW: 12.1 % (ref 11.5–15.5)
WBC: 6.9 10*3/uL (ref 4.0–10.5)
nRBC: 0 % (ref 0.0–0.2)

## 2020-09-01 LAB — ECHOCARDIOGRAM COMPLETE
AR max vel: 0.88 cm2
AV Area VTI: 0.9 cm2
AV Area mean vel: 0.84 cm2
AV Mean grad: 5.7 mmHg
AV Peak grad: 10.5 mmHg
Ao pk vel: 1.62 m/s
Area-P 1/2: 4.21 cm2
Calc EF: 21.5 %
Height: 59 in
MV VTI: 0.72 cm2
S' Lateral: 4 cm
Single Plane A2C EF: 21 %
Single Plane A4C EF: 18.6 %
Weight: 2243.4 oz

## 2020-09-01 LAB — MAGNESIUM: Magnesium: 2.2 mg/dL (ref 1.7–2.4)

## 2020-09-01 LAB — GLUCOSE, CAPILLARY
Glucose-Capillary: 99 mg/dL (ref 70–99)
Glucose-Capillary: 86 mg/dL (ref 70–99)

## 2020-09-01 MED ORDER — DOXYCYCLINE HYCLATE 100 MG PO TABS
100.0000 mg | ORAL_TABLET | Freq: Two times a day (BID) | ORAL | Status: DC
  Administered 2020-09-01 – 2020-09-03 (×5): 100 mg via ORAL
  Filled 2020-09-01 (×5): qty 1

## 2020-09-01 MED ORDER — CEFDINIR 300 MG PO CAPS
300.0000 mg | ORAL_CAPSULE | Freq: Two times a day (BID) | ORAL | Status: DC
  Administered 2020-09-01 – 2020-09-03 (×5): 300 mg via ORAL
  Filled 2020-09-01 (×6): qty 1

## 2020-09-01 NOTE — Progress Notes (Signed)
Progress Note    DELIA SLATTEN  EPP:[id number] DOB: [birth year]  DOA: 08/21/2020 PCP: Marrian Salvage, FNP      Brief Narrative:    Medical records reviewed and are as summarized below:  Alexandria Randolph is a 86 y.o. female with medical history significant for breast cancer on letrozole, remote history of tobacco use disorder, presented to the hospital with 1 to 2-week history of exertional shortness of breath.  2D echo showed EF estimated at 60%, grade 2 diastolic dysfunction and severe aortic stenosis.  She underwent TAVR on 08/31/2020.    Assessment/Plan:   Principal Problem:   Acute CHF (congestive heart failure) (HCC) Active Problems:   Malignant neoplasm of upper-outer quadrant of right breast in female, estrogen receptor positive (HCC)   Elevated d-dimer   Severe aortic stenosis   S/P TAVR (transcatheter aortic valve replacement)   Body mass index is 28.32 kg/m.  Severe aortic stenosis: S/p TAVR on 08/31/2020.  She is on aspirin and Plavix.  Follow-up with cardiologist.  Acute systolic and diastolic CHF: 2D echo showed EF estimated at 63%, grade 2 diastolic dysfunction.  Continue oral Lasix.  Elevated troponins: This was attributed to demand ischemia.  Cardiac cath showed minimal CAD.  Probable right-sided pneumonia, ?  Neoplasm: Continue Omnicef and dicyclomine.  Outpatient follow-up with chest CT in 2 to 3 months for further evaluation.  History of breast cancer: Letrozole on hold.     Diet Order             Diet Heart Room service appropriate? Yes; Fluid consistency: Thin  Diet effective now                      Consultants: Cardiologist  Procedures: TAVR on 08/31/2020    Medications:    aspirin  81 mg Oral Daily   bisacodyl  5 mg Oral Once   cefdinir  300 mg Oral Q12H   Chlorhexidine Gluconate Cloth  6 each Topical Daily   clopidogrel  75 mg Oral Q breakfast   doxycycline  100 mg Oral Q12H   Continuous Infusions:   sodium chloride Stopped (09/01/20 0809)   sodium chloride     sodium chloride     lactated ringers 100 mL/hr at 09/01/20 1000   nitroGLYCERIN     phenylephrine (NEO-SYNEPHRINE) Adult infusion       Anti-infectives (From admission, onward)    Start     Dose/Rate Route Frequency Ordered Stop   09/01/20 1000  cefdinir (OMNICEF) capsule 300 mg        300 mg Oral Every 12 hours 09/01/20 0842 09/06/20 0959   09/01/20 1000  doxycycline (VIBRA-TABS) tablet 100 mg        100 mg Oral Every 12 hours 09/01/20 0842 09/06/20 0959   08/31/20 2200  ceFAZolin (ANCEF) IVPB 2g/100 mL premix        2 g 200 mL/hr over 30 Minutes Intravenous Every 8 hours 08/31/20 1725 09/01/20 0644   08/31/20 0400  ceFAZolin (ANCEF) IVPB 2g/100 mL premix        2 g 200 mL/hr over 30 Minutes Intravenous To Surgery 08/30/20 0749 08/31/20 1454   08/22/20 1000  cefdinir (OMNICEF) capsule 300 mg  Status:  Discontinued        300 mg Oral Every 12 hours 08/22/20 0856 08/23/20 0719   08/22/20 1000  azithromycin (ZITHROMAX) tablet 500 mg  Status:  Discontinued  500 mg Oral Daily 08/22/20 0856 08/23/20 0719   08/21/20 1500  cefTRIAXone (ROCEPHIN) 1 g in sodium chloride 0.9 % 100 mL IVPB        1 g 200 mL/hr over 30 Minutes Intravenous  Once 08/21/20 1455 08/21/20 1540   08/21/20 1500  azithromycin (ZITHROMAX) 500 mg in sodium chloride 0.9 % 250 mL IVPB  Status:  Discontinued        500 mg 250 mL/hr over 60 Minutes Intravenous  Once 08/21/20 1455 08/21/20 1611              Family Communication/Anticipated D/C date and plan/Code Status   DVT prophylaxis:      Code Status: DNR  Family Communication: None Disposition Plan:    Status is: Inpatient  Remains inpatient appropriate because:Inpatient level of care appropriate due to severity of illness  Dispo: The patient is from: Home              Anticipated d/c is to: Home              Patient currently is not medically stable to d/c.   Difficult to  place patient No           Subjective:   Interval events noted.  No cough, chest pain or shortness of breath.  Objective:    Vitals:   09/01/20 0820 09/01/20 0900 09/01/20 1000 09/01/20 1100  BP: 103/64 (!) 96/56 103/60   Pulse: 64 (!) 59 66   Resp: 18 14 16    Temp:    97.8 F (36.6 C)  TempSrc:    Oral  SpO2: 99% 97% 99%   Weight:      Height:       No data found.   Intake/Output Summary (Last 24 hours) at 09/01/2020 1158 Last data filed at 09/01/2020 1000 Gross per 24 hour  Intake 3773.8 ml  Output 525 ml  Net 3248.8 ml   Filed Weights   08/31/20 0558 08/31/20 1325 09/01/20 0500  Weight: 57.9 kg 57.9 kg 63.6 kg    Exam:  GEN: NAD SKIN: No rash EYES: EOMI ENT: MMM CV: RRR PULM: CTA B ABD: soft, ND, NT, +BS CNS: AAO x 3, non focal EXT: No edema or tenderness        Data Reviewed:   I have personally reviewed following labs and imaging studies:  Labs: Labs show the following:   Basic Metabolic Panel: Recent Labs  Lab 08/27/20 0320 08/28/20 0304 08/30/20 0901 08/31/20 0234 09/01/20 0217  NA 136 138 137 135 137  K 3.4* 3.9 3.8 4.1 4.1  CL 103 107 109 106 108  CO2 22 20* 17* 20* 20*  GLUCOSE 105* 107* 141* 101* 113*  BUN 13 13 18 19 14   CREATININE 0.92 0.95 1.12* 1.04* 0.83  CALCIUM 9.0 8.9 9.2 8.8* 8.4*  MG  --   --   --   --  2.2   GFR Estimated Creatinine Clearance: 40.9 mL/min (by C-G formula based on SCr of 0.83 mg/dL). Liver Function Tests: No results for input(s): AST, ALT, ALKPHOS, BILITOT, PROT, ALBUMIN in the last 168 hours. No results for input(s): LIPASE, AMYLASE in the last 168 hours. No results for input(s): AMMONIA in the last 168 hours. Coagulation profile Recent Labs  Lab 08/30/20 1633  INR 1.1    CBC: Recent Labs  Lab 08/26/20 0239 08/31/20 0234 09/01/20 0217  WBC 5.9 5.3 6.9  HGB 12.1 12.4 11.5*  HCT 36.5 38.4 36.4  MCV  89.2 90.4 91.7  PLT 216 265 209   Cardiac Enzymes: No results for  input(s): CKTOTAL, CKMB, CKMBINDEX, TROPONINI in the last 168 hours. BNP (last 3 results) No results for input(s): PROBNP in the last 8760 hours. CBG: Recent Labs  Lab 09/01/20 1114  GLUCAP 99   D-Dimer: No results for input(s): DDIMER in the last 72 hours. Hgb A1c: Recent Labs    08/30/20 1633  HGBA1C 5.6   Lipid Profile: No results for input(s): CHOL, HDL, LDLCALC, TRIG, CHOLHDL, LDLDIRECT in the last 72 hours. Thyroid function studies: No results for input(s): TSH, T4TOTAL, T3FREE, THYROIDAB in the last 72 hours.  Invalid input(s): FREET3 Anemia work up: No results for input(s): VITAMINB12, FOLATE, FERRITIN, TIBC, IRON, RETICCTPCT in the last 72 hours. Sepsis Labs: Recent Labs  Lab 08/26/20 0239 08/31/20 0234 09/01/20 0217  WBC 5.9 5.3 6.9    Microbiology Recent Results (from the past 240 hour(s))  Surgical pcr screen     Status: None   Collection Time: 08/30/20  4:22 PM   Specimen: Nasal Mucosa; Nasal Swab  Result Value Ref Range Status   MRSA, PCR NEGATIVE NEGATIVE Final   Staphylococcus aureus NEGATIVE NEGATIVE Final    Comment: (NOTE) The Xpert SA Assay (FDA approved for NASAL specimens in patients 69 years of age and older), is one component of a comprehensive surveillance program. It is not intended to diagnose infection nor to guide or monitor treatment. Performed at [facility], [place] [address], [place], [place] [address]     Procedures and diagnostic studies:  DG Chest 2 View  Result Date: 08/30/2020 CLINICAL DATA:  Preoperative evaluation EXAM: CHEST - 2 VIEW COMPARISON:  08/21/2020 FINDINGS: Borderline enlargement of cardiac silhouette. Mediastinal contours and pulmonary vascularity normal. Atelectasis versus infiltrate at medial RIGHT lower lobe. Small LEFT pleural effusion. Remaining lungs clear. No pneumothorax. Bones demineralized with degenerative changes of the thoracic spine and LEFT glenohumeral joint. IMPRESSION: Borderline  enlargement of cardiac silhouette with atelectasis versus consolidation in medial RIGHT lower lobe. Small LEFT pleural effusion. Electronically Signed   By: Lavonia Dana M.D.   On: 08/30/2020 17:09   ECHOCARDIOGRAM LIMITED  Result Date: 08/31/2020    ECHOCARDIOGRAM LIMITED REPORT   Patient Name:   SINDHU NGUYEN Date of Exam: 08/31/2020 Medical Rec #:  [id number]         Height:       59.0 in Accession #:    [id number]        Weight:       127.6 lb Date of Birth:  02/04/1935          BSA:          1.524 m Patient Age:    75 years          BP:           97/71 mmHg Patient Gender: F                 HR:           86 bpm. Exam Location:  Inpatient Procedure: Limited Echo, Cardiac Doppler and Color Doppler Indications:     Aortic stenosis  History:         Patient has prior history of Echocardiogram examinations, most                  recent 08/22/2020. CHF, Aortic Valve Disease; Risk  Factors:Hypertension, Dyslipidemia and Current Smoker.  Sonographer:     Dustin Flock Referring Phys:  [id number] Woodfin Ganja THOMPSON Diagnosing Phys: [name] MD IMPRESSIONS  1. Note patient had brief period of EMD when valve was crossed. Responded to epi and short course of chest compression.  2. Left ventricular ejection fraction, by estimation, is 20 to 25%. The left ventricle has severely decreased function. The left ventricle demonstrates global hypokinesis. The left ventricular internal cavity size was severely dilated.  3. Left atrial size was moderately dilated.  4. The mitral valve is abnormal. Mild mitral valve regurgitation.  5. Pre TAVR: severely calcified tri leaflet AV with limited motion of the right coronary custp severe low gradient AS mean gradient 31 mmHg peak 51 mmHg AVA 0.26 cm2         Post TAVR: Well positioned 23 mm Sapien 3 valve No PVL mean gradient 2 peak 5 mmhg AVA 2.1 cm2 . The aortic valve is tricuspid. There is severe calcifcation of the aortic valve. Aortic valve regurgitation is mild.  Severe aortic valve stenosis. Procedure Date: 08/31/2020. FINDINGS  Left Ventricle: Left ventricular ejection fraction, by estimation, is 20 to 25%. The left ventricle has severely decreased function. The left ventricle demonstrates global hypokinesis. The left ventricular internal cavity size was severely dilated. There is no left ventricular hypertrophy. Left Atrium: Left atrial size was moderately dilated. Pericardium: There is no evidence of pericardial effusion. Mitral Valve: The mitral valve is abnormal. There is moderate thickening of the mitral valve leaflet(s). There is moderate calcification of the mitral valve leaflet(s). Mild mitral annular calcification. Mild mitral valve regurgitation. Aortic Valve: Pre TAVR: severely calcified tri leaflet AV with limited motion of the right coronary custp severe low gradient AS mean gradient 31 mmHg peak 51 mmHg AVA 0.26 cm2 Post TAVR: Well positioned 23 mm Sapien 3 valve No PVL mean gradient 2 peak 5 mmhg AVA 2.1 cm2. The aortic valve is tricuspid. There is severe calcifcation of the aortic valve. Aortic valve regurgitation is mild. Severe aortic stenosis is present. Aortic  valve mean gradient measures 2.5 mmHg. Aortic valve peak gradient measures 5.7 mmHg. Aortic valve area, by VTI measures 2.21 cm. There is a 23 mm Sapien prosthetic, stented (TAVR) valve present in the aortic position. Aorta: The aortic root is normal in size and structure. Additional Comments: Note patient had brief period of EMD when valve was crossed. Responded to epi and short course of chest compression. LEFT VENTRICLE PLAX 2D LVOT diam:     2.00 cm LV SV:         33 LV SV Index:   22 LVOT Area:     3.14 cm  AORTIC VALVE AV Area (Vmax):    2.20 cm AV Area (Vmean):   2.31 cm AV Area (VTI):     2.21 cm AV Vmax:           119.00 cm/s AV Vmean:          75.300 cm/s AV VTI:            0.151 m AV Peak Grad:      5.7 mmHg AV Mean Grad:      2.5 mmHg LVOT Vmax:         83.50 cm/s LVOT Vmean:         55.300 cm/s LVOT VTI:          0.106 m LVOT/AV VTI ratio: 0.70  SHUNTS Systemic VTI:  0.11 m Systemic Diam: 2.00 cm  [name] MD Electronically signed by [name] MD Signature Date/Time: 08/31/2020/4:44:23 PM    Final    Structural Heart Procedure  Result Date: 08/31/2020 See surgical note for result.              LOS: 11 days   [place] [employer], advertising on [email or web address]. If 7PM-7AM, please contact night-coverage at [email or web address]     09/01/2020, 11:58 AM

## 2020-09-01 NOTE — Progress Notes (Addendum)
Progress Note  Patient Name: Alexandria Randolph Date of Encounter: 09/01/2020  [facility] Cardiologist: Donato Heinz, MD   Subjective   Feels well this morning.  No chest pain or shortness of breath.  Inpatient Medications    Scheduled Meds:  aspirin  81 mg Oral Daily   bisacodyl  5 mg Oral Once   Chlorhexidine Gluconate Cloth  6 each Topical Daily   clopidogrel  75 mg Oral Q breakfast   sodium chloride flush  3 mL Intravenous Q12H   Continuous Infusions:  sodium chloride 10 mL/hr at 09/01/20 0600   sodium chloride     sodium chloride     lactated ringers 100 mL/hr at 09/01/20 0600   nitroGLYCERIN     norepinephrine (LEVOPHED) Adult infusion 0.8 mcg/min (09/01/20 0600)   phenylephrine (NEO-SYNEPHRINE) Adult infusion     PRN Meds: sodium chloride, sodium chloride, acetaminophen **OR** acetaminophen, morphine injection, ondansetron (ZOFRAN) IV, oxyCODONE, polyethylene glycol, sodium chloride flush, traMADol   Vital Signs    Vitals:   09/01/20 0515 09/01/20 0530 09/01/20 0545 09/01/20 0600  BP: (!) 96/59 (!) 105/59 (!) 105/57 (!) 101/55  Pulse: 60 62 61 62  Resp: 15 15 13 13   Temp:      TempSrc:      SpO2: 100% 95% 99% 98%  Weight:      Height:        Intake/Output Summary (Last 24 hours) at 09/01/2020 0651 Last data filed at 09/01/2020 0615 Gross per 24 hour  Intake 2993.17 ml  Output 825 ml  Net 2168.17 ml   Last 3 Weights 09/01/2020 08/31/2020 08/31/2020  Weight (lbs) 140 lb 3.4 oz 127 lb 10.3 oz 127 lb 10.3 oz  Weight (kg) 63.6 kg 57.9 kg 57.9 kg      Telemetry    Normal sinus rhythm, PVCs, no bradycardia arrhythmias, no recurrent atrial fibrillation - Personally Reviewed  ECG    Normal sinus rhythm, heart rate 60 bpm, ST and T wave abnormality consider anterolateral ischemia, prolonged QT - Personally Reviewed  Physical Exam  Alert, oriented woman in no distress GEN: No acute distress.   Neck: No JVD Cardiac: RRR, no murmurs, rubs, or  gallops.  Respiratory: Clear to auscultation bilaterally. GI: Soft, nontender, non-distended  MS: No edema; No deformity.  Bilateral groin sites are clear.  Indwelling left femoral venous sheath noted.  Temporary pacing catheter removed at the time of my exam. Neuro:  Nonfocal  Psych: Normal affect   Labs    High Sensitivity Troponin:   Recent Labs  Lab 08/21/20 1558 08/21/20 1834  TROPONINIHS 372* 389*      Chemistry Recent Labs  Lab 08/30/20 0901 08/31/20 0234 09/01/20 0217  NA 137 135 137  K 3.8 4.1 4.1  CL 109 106 108  CO2 17* 20* 20*  GLUCOSE 141* 101* 113*  BUN 18 19 14   CREATININE 1.12* 1.04* 0.83  CALCIUM 9.2 8.8* 8.4*  GFRNONAA 48* 53* >60  ANIONGAP 11 9 9      Hematology Recent Labs  Lab 08/26/20 0239 08/31/20 0234 09/01/20 0217  WBC 5.9 5.3 6.9  RBC 4.09 4.25 3.97  HGB 12.1 12.4 11.5*  HCT 36.5 38.4 36.4  MCV 89.2 90.4 91.7  MCH 29.6 29.2 29.0  MCHC 33.2 32.3 31.6  RDW 12.2 12.5 12.1  PLT 216 265 209    BNPNo results for input(s): BNP, PROBNP in the last 168 hours.   DDimer No results for input(s): DDIMER in the last  168 hours.   Radiology    DG Chest 2 View  Result Date: 08/30/2020 CLINICAL DATA:  Preoperative evaluation EXAM: CHEST - 2 VIEW COMPARISON:  08/21/2020 FINDINGS: Borderline enlargement of cardiac silhouette. Mediastinal contours and pulmonary vascularity normal. Atelectasis versus infiltrate at medial RIGHT lower lobe. Small LEFT pleural effusion. Remaining lungs clear. No pneumothorax. Bones demineralized with degenerative changes of the thoracic spine and LEFT glenohumeral joint. IMPRESSION: Borderline enlargement of cardiac silhouette with atelectasis versus consolidation in medial RIGHT lower lobe. Small LEFT pleural effusion. Electronically Signed   By: Lavonia Dana M.D.   On: 08/30/2020 17:09   ECHOCARDIOGRAM LIMITED  Result Date: 08/31/2020    ECHOCARDIOGRAM LIMITED REPORT   Patient Name:   Alexandria Randolph Date of Exam:  08/31/2020 Medical Rec #:  [id number]         Height:       59.0 in Accession #:    [id number]        Weight:       127.6 lb Date of Birth:  [birth year]          BSA:          1.524 m Patient Age:    86 years          BP:           97/71 mmHg Patient Gender: F                 HR:           86 bpm. Exam Location:  Inpatient Procedure: Limited Echo, Cardiac Doppler and Color Doppler Indications:     Aortic stenosis  History:         Patient has prior history of Echocardiogram examinations, most                  recent 08/22/2020. CHF, Aortic Valve Disease; Risk                  Factors:Hypertension, Dyslipidemia and Current Smoker.  Sonographer:     Dustin Flock Referring Phys:  [id number] Woodfin Ganja Keysean Savino Diagnosing Phys: [name] MD IMPRESSIONS  1. Note patient had brief period of EMD when valve was crossed. Responded to epi and short course of chest compression.  2. Left ventricular ejection fraction, by estimation, is 20 to 25%. The left ventricle has severely decreased function. The left ventricle demonstrates global hypokinesis. The left ventricular internal cavity size was severely dilated.  3. Left atrial size was moderately dilated.  4. The mitral valve is abnormal. Mild mitral valve regurgitation.  5. Pre TAVR: severely calcified tri leaflet AV with limited motion of the right coronary custp severe low gradient AS mean gradient 31 mmHg peak 51 mmHg AVA 0.26 cm2         Post TAVR: Well positioned 23 mm Sapien 3 valve No PVL mean gradient 2 peak 5 mmhg AVA 2.1 cm2 . The aortic valve is tricuspid. There is severe calcifcation of the aortic valve. Aortic valve regurgitation is mild. Severe aortic valve stenosis. Procedure Date: 08/31/2020. FINDINGS  Left Ventricle: Left ventricular ejection fraction, by estimation, is 20 to 25%. The left ventricle has severely decreased function. The left ventricle demonstrates global hypokinesis. The left ventricular internal cavity size was severely dilated. There is no left  ventricular hypertrophy. Left Atrium: Left atrial size was moderately dilated. Pericardium: There is no evidence of pericardial effusion. Mitral Valve: The mitral valve is abnormal. There is moderate thickening of the mitral  valve leaflet(s). There is moderate calcification of the mitral valve leaflet(s). Mild mitral annular calcification. Mild mitral valve regurgitation. Aortic Valve: Pre TAVR: severely calcified tri leaflet AV with limited motion of the right coronary custp severe low gradient AS mean gradient 31 mmHg peak 51 mmHg AVA 0.26 cm2 Post TAVR: Well positioned 23 mm Sapien 3 valve No PVL mean gradient 2 peak 5 mmhg AVA 2.1 cm2. The aortic valve is tricuspid. There is severe calcifcation of the aortic valve. Aortic valve regurgitation is mild. Severe aortic stenosis is present. Aortic  valve mean gradient measures 2.5 mmHg. Aortic valve peak gradient measures 5.7 mmHg. Aortic valve area, by VTI measures 2.21 cm. There is a 23 mm Sapien prosthetic, stented (TAVR) valve present in the aortic position. Aorta: The aortic root is normal in size and structure. Additional Comments: Note patient had brief period of EMD when valve was crossed. Responded to epi and short course of chest compression. LEFT VENTRICLE PLAX 2D LVOT diam:     2.00 cm LV SV:         33 LV SV Index:   22 LVOT Area:     3.14 cm  AORTIC VALVE AV Area (Vmax):    2.20 cm AV Area (Vmean):   2.31 cm AV Area (VTI):     2.21 cm AV Vmax:           119.00 cm/s AV Vmean:          75.300 cm/s AV VTI:            0.151 m AV Peak Grad:      5.7 mmHg AV Mean Grad:      2.5 mmHg LVOT Vmax:         83.50 cm/s LVOT Vmean:        55.300 cm/s LVOT VTI:          0.106 m LVOT/AV VTI ratio: 0.70  SHUNTS Systemic VTI:  0.11 m Systemic Diam: 2.00 cm [name] MD Electronically signed by [name] MD Signature Date/Time: 08/31/2020/4:44:23 PM    Final    Structural Heart Procedure  Result Date: 08/31/2020 See surgical note for result.   Cardiac  Studies   Postoperative day #1 echocardiogram pending  Patient Profile     86 y.o. female with acute systolic heart failure in the setting of critical aortic stenosis and severe LV dysfunction, treated with TAVR August 31, 2020  Assessment & Plan    1. Critical aortic stenosis: Patient now postoperative day #1 status post TAVR.  Post valve deployment, patient required brief period of CPR and epinephrine administration.  Progressing well this morning with stable heart rhythm.  Requiring minimal support with low-dose norepinephrine.  Plan: Check 2D echocardiogram today, continue dual antiplatelet therapy with aspirin and clopidogrel, mobilize, transfer to telemetry bed. 2. Acute systolic heart failure: Patient diuresed prior to TAVR.  Noted to have severe LV dysfunction, likely secondary to valvular heart disease.  Now status post TAVR.  Wean off of low-dose norepinephrine this morning.  Hold diuretics today. 3.  Atrial fibrillation with RVR: This occurred during TAVR procedure after receiving epinephrine.  In sinus rhythm on IV amiodarone.  Suspect this was all related to hemodynamic instability at the time of TAVR procedure.  Stop amiodarone this morning.  Continue to monitor on telemetry. 4. Incidental finding: pre TAVR CT scan showed an irregular nodular and masslike focus of consolidation in the posterior right lower and posterior right upper lung lobes, stable in size  and slightly more organized since 08/21/2020 chest CT, indeterminate for pneumonia versus neoplasm. Short-term follow-up chest CT recommended in 2-3 months, preferably after a trial of antibiotic therapy. Discussed with pharmacy and started on cefdinir 300mg  BID and doxycycline 100mg  BID for 5 days.    Disposition: Mobilize today, discontinue left femoral venous sheath and right radial arterial line, transfer to telemetry bed.      For questions or updates, please contact [place] Please consult [email or web address] for contact  info under        Signed, Sherren Mocha, MD  09/01/2020, 6:51 AM

## 2020-09-02 ENCOUNTER — Other Ambulatory Visit: Payer: Self-pay | Admitting: Physician Assistant

## 2020-09-02 DIAGNOSIS — Z952 Presence of prosthetic heart valve: Secondary | ICD-10-CM

## 2020-09-02 LAB — CBC
HCT: 34 % — ABNORMAL LOW (ref 36.0–46.0)
Hemoglobin: 11 g/dL — ABNORMAL LOW (ref 12.0–15.0)
MCH: 29.3 pg (ref 26.0–34.0)
MCHC: 32.4 g/dL (ref 30.0–36.0)
MCV: 90.7 fL (ref 80.0–100.0)
Platelets: 175 10*3/uL (ref 150–400)
RBC: 3.75 MIL/uL — ABNORMAL LOW (ref 3.87–5.11)
RDW: 12.4 % (ref 11.5–15.5)
WBC: 7.7 10*3/uL (ref 4.0–10.5)
nRBC: 0 % (ref 0.0–0.2)

## 2020-09-02 LAB — BASIC METABOLIC PANEL
Anion gap: 8 (ref 5–15)
BUN: 14 mg/dL (ref 8–23)
CO2: 23 mmol/L (ref 22–32)
Calcium: 8.5 mg/dL — ABNORMAL LOW (ref 8.9–10.3)
Chloride: 104 mmol/L (ref 98–111)
Creatinine, Ser: 0.98 mg/dL (ref 0.44–1.00)
GFR, Estimated: 57 mL/min — ABNORMAL LOW (ref >60)
Glucose, Bld: 92 mg/dL (ref 70–99)
Potassium: 3.8 mmol/L (ref 3.5–5.1)
Sodium: 135 mmol/L (ref 135–145)

## 2020-09-02 MED ORDER — EMPAGLIFLOZIN 10 MG PO TABS
10.0000 mg | ORAL_TABLET | Freq: Every day | ORAL | Status: DC
  Administered 2020-09-02 – 2020-09-03 (×2): 10 mg via ORAL
  Filled 2020-09-02 (×2): qty 1

## 2020-09-02 MED FILL — Potassium Chloride Inj 2 mEq/ML: INTRAVENOUS | Qty: 40 | Status: AC

## 2020-09-02 MED FILL — Heparin Sodium (Porcine) Inj 1000 Unit/ML: INTRAMUSCULAR | Qty: 30 | Status: AC

## 2020-09-02 MED FILL — Magnesium Sulfate Inj 50%: INTRAMUSCULAR | Qty: 10 | Status: AC

## 2020-09-02 NOTE — Discharge Instructions (Signed)
ACTIVITY AND EXERCISE °• Daily activity and exercise are an important part of your recovery. People recover at different rates depending on their general health and type of valve procedure. °• Most people recovering from TAVR feel better relatively quickly  °• No lifting, pushing, pulling more than 10 pounds (examples to avoid: groceries, vacuuming, gardening, golfing): °            - For one week with a procedure through the groin. °            - For six weeks for procedures through the chest wall or neck. °NOTE: You will typically see one of our providers 7-14 days after your procedure to discuss WHEN TO RESUME the above activities.  °  °  °DRIVING °• Do not drive until you are seen for follow up and cleared by a provider. Generally, we ask patient to not drive for 1 week after their procedure. °• If you have been told by your doctor in the past that you may not drive, you must talk with him/her before you begin driving again. °  °DRESSING °• Groin site: you may leave the clear dressing over the site for up to one week or until it falls off. °  °HYGIENE °• If you had a femoral (leg) procedure, you may take a shower when you return home. After the shower, pat the site dry. Do NOT use powder, oils or lotions in your groin area until the site has completely healed. °• If you had a chest procedure, you may shower when you return home unless specifically instructed not to by your discharging practitioner. °            - DO NOT scrub incision; pat dry with a towel. °            - DO NOT apply any lotions, oils, powders to the incision. °            - No tub baths / swimming for at least 2 weeks. °• If you notice any fevers, chills, increased pain, swelling, bleeding or pus, please contact your doctor. °  °ADDITIONAL INFORMATION °• If you are going to have an upcoming dental procedure, please contact our office as you will require antibiotics ahead of time to prevent infection on your heart valve.  ° ° °If you have any  questions or concerns you can call the structural heart phone during normal business hours 8am-4pm. If you have an urgent need after hours or weekends please call [phone] to talk to the on call provider for general cardiology. If you have an emergency that requires immediate attention, please call 911.  ° ° °After TAVR Checklist ° °Check  Test Description  ° Follow up appointment in 1-2 weeks  You will see our structural heart physician assistant, Katie Eyoel Throgmorton. Your incision sites will be checked and you will be cleared to drive and resume all normal activities if you are doing well.    ° 1 month echo and follow up  You will have an echo to check on your new heart valve and be seen back in the office by Katie Adlai Nieblas. Many times the echo is not read by your appointment time, but Katie will call you later that day or the following day to report your results.  ° Follow up with your primary cardiologist You will need to be seen by your primary cardiologist in the following 3-6 months after your 1 month appointment in the valve   clinic. Often times your Plavix or Aspirin will be discontinued during this time, but this is decided on a case by case basis.   ° 1 year echo and follow up You will have another echo to check on your heart valve after 1 year and be seen back in the office by Katie Aiyana Stegmann. This your last structural heart visit.  ° Bacterial endocarditis prophylaxis  You will have to take antibiotics for the rest of your life before all dental procedures (even teeth cleanings) to protect your heart valve. Antibiotics are also required before some surgeries. Please check with your cardiologist before scheduling any surgeries. Also, please make sure to tell us if you have a penicillin allergy as you will require an alternative antibiotic.   ° ° °

## 2020-09-02 NOTE — Progress Notes (Signed)
CARDIAC REHAB PHASE I   Went to offer to walk with pt, PT getting ready to work with her. Pt states improvement in breathing. Denies questions or concerns at this time. Will continue to follow.  [id number] Rufina Falco, RN BSN 09/02/2020 2:47 PM

## 2020-09-02 NOTE — Progress Notes (Signed)
[facility] PROGRESS NOTE    Progress Note  STORM DULSKI  QIH:[id number] DOB: 02/17/1935 DOA: 08/21/2020 PCP: Marrian Salvage, FNP     Brief Narrative:   Alexandria Randolph is an 86 y.o. female past medical history of breast cancer currently on letrozole, remote history of tobacco abuse comes in with 1 to 2 weeks of shortness of breath on exertion.  2D echo was done that showed an EF of 25% and grade 2 diastolic heart failure, with severe aortic stenosis with a gradient of 40 mmHg, cardiology recommended transfer to [facility].   Assessment/Plan:   Severe aortic stenosis: 2D echo shows severe stenosis, from 08/24/2020 showed minimal CAD peak gradient and aortic valve was 70 mmHg with a valve area of 0.3 cm greater.  Status post TAVR's on 09/01/2018 minimal support with norepinephrine. For 2D echo today, on aspirin and Plavix. PT OT has been consulted, out of bed to chair. Blood pressure is borderline she is asymptomatic, further management per TAVR's team.  A. fib with RVR: This occurred during TAVR's procedure after receiving epinephrine. She was started on IV amiodarone and now in sinus rhythm, likely hemodynamic stability secondary to TAVR's procedure. Morning.  Acute on chronic systolic and diastolic heart failure: Further management per cardiology has been weaned off nor epi holding diuretics today.  Elevated troponins Likely demand ischemia in the setting of severe aortic stenosis. Discontinue heparin.  History of breast cancer: Noted hold antineoplastic medications.  Elevated D-dimer: CT angio of the chest negative for PE, lower extremity Doppler was also negative for DVT.    DVT prophylaxis: heparin Family Communication:none Status is: Inpatient  Remains inpatient appropriate because:Hemodynamically unstable   Dispo: The patient is from: Home              Anticipated d/c is to: Home              Patient currently is not medically stable to d/c.    Difficult to place patient No  Code Status:     Code Status Orders  (From admission, onward)           Start     Ordered   08/21/20 1612  Do not attempt resuscitation (DNR)  Continuous       Question Answer Comment  In the event of cardiac or respiratory ARREST Do not call a "code blue"   In the event of cardiac or respiratory ARREST Do not perform Intubation, CPR, defibrillation or ACLS   In the event of cardiac or respiratory ARREST Use medication by any route, position, wound care, and other measures to relive pain and suffering. May use oxygen, suction and manual treatment of airway obstruction as needed for comfort.      08/21/20 1611           Code Status History     This patient has a current code status but no historical code status.   Advance Care Planning Activity         IV Access:   Peripheral IV   Procedures and diagnostic studies:   ECHOCARDIOGRAM COMPLETE  Result Date: 09/01/2020    ECHOCARDIOGRAM REPORT   Patient Name:   Alexandria Randolph Date of Exam: 09/01/2020 Medical Rec #:  [id number]       Height:       59.0 in Accession #:    [id number]      Weight:       140.2 lb Date of Birth:  February 03, 1935  BSA:          1.586 m Patient Age:    79 years        BP:           101/55 mmHg Patient Gender: F               HR:           63 bpm. Exam Location:  Inpatient Procedure: 2D Echo, Cardiac Doppler and Color Doppler Indications:    S/P TAVR  History:        Patient has prior history of Echocardiogram examinations, most                 recent 08/31/2020. Signs/Symptoms:Murmur; Risk                 Factors:Dyslipidemia and Hypertension. 08/31/20 TAVR                 08/23/20 cath.  Sonographer:    [place] Referring Phys: [id number] [place]  1. The aortic valve has been repaired/replaced. Well seated 23 mm Sapien 3 Valve with DVI 0.35, no paravalvular leak, and with appropriate gradients and acceleration time. Aortic valve regurgitation is  not visualized. No aortic stenosis is present. Aortic valve mean gradient measures 5.7 mmHg. Aortic valve Vmax measures 1.62 m/s. Aortic valve acceleration time measures 80 msec.  2. Left ventricular ejection fraction, by estimation, is 20 to 25%. Left ventricular ejection fraction by 2D MOD biplane is 21.5 %. The left ventricle has severely decreased function. The left ventricle demonstrates global hypokinesis. Left ventricular diastolic parameters are consistent with Grade II diastolic dysfunction (pseudonormalization).  3. Right ventricular systolic function is mildly reduced. The right ventricular size is normal. Tricuspid regurgitation signal is inadequate for assessing PA pressure.  4. Left atrial size was moderately dilated.  5. The mitral valve is abnormal. Trivial mitral valve regurgitation. No evidence of mitral stenosis.  6. The inferior vena cava is normal in size with greater than 50% respiratory variability, suggesting right atrial pressure of 3 mmHg. Comparison(s): A prior study was performed on 08/31/20. Prior images reviewed side by side. Stable TAVR positioning. FINDINGS  Left Ventricle: Left ventricular ejection fraction, by estimation, is 20 to 25%. Left ventricular ejection fraction by 2D MOD biplane is 21.5 %. The left ventricle has severely decreased function. The left ventricle demonstrates global hypokinesis. The left ventricular internal cavity size was normal in size. There is no left ventricular hypertrophy. Left ventricular diastolic parameters are consistent with Grade II diastolic dysfunction (pseudonormalization). Right Ventricle: The right ventricular size is normal. Right vetricular wall thickness was not well visualized. Right ventricular systolic function is mildly reduced. Tricuspid regurgitation signal is inadequate for assessing PA pressure. Left Atrium: Left atrial size was moderately dilated. Right Atrium: Right atrial size was normal in size. Pericardium: There is no evidence  of pericardial effusion. Mitral Valve: The mitral valve is abnormal. Mild to moderate mitral annular calcification. Trivial mitral valve regurgitation. No evidence of mitral valve stenosis. MV peak gradient, 5.6 mmHg. The mean mitral valve gradient is 2.0 mmHg. Tricuspid Valve: The tricuspid valve is grossly normal. Tricuspid valve regurgitation is trivial. Aortic Valve: The aortic valve has been repaired/replaced. Aortic valve regurgitation is not visualized. No aortic stenosis is present. Aortic valve mean gradient measures 5.7 mmHg. Aortic valve peak gradient measures 10.5 mmHg. Aortic valve area, by VTI measures 0.90  cm. There is a Sapien prosthetic, stented (TAVR) valve present in the  aortic position. Pulmonic Valve: The pulmonic valve was grossly normal. Pulmonic valve regurgitation is not visualized. No evidence of pulmonic stenosis. Aorta: The aortic root and ascending aorta are structurally normal, with no evidence of dilitation. Venous: The inferior vena cava is normal in size with greater than 50% respiratory variability, suggesting right atrial pressure of 3 mmHg. IAS/Shunts: The atrial septum is grossly normal.  LEFT VENTRICLE PLAX 2D                        Biplane EF (MOD) LVIDd:         5.20 cm         LV Biplane EF:   Left LVIDs:         4.00 cm                          ventricular LV PW:         0.70 cm                          ejection LV IVS:        1.00 cm                          fraction by LVOT diam:     1.80 cm                          2D MOD LV SV:         30                               biplane is LV SV Index:   19                               21.5 %. LVOT Area:     2.54 cm                                Diastology                                LV e' medial:    4.05 cm/s LV Volumes (MOD)               LV E/e' medial:  23.5 LV vol d, MOD    80.6 ml       LV e' lateral:   6.64 cm/s A2C:                           LV E/e' lateral: 14.3 LV vol d, MOD    70.6 ml A4C: LV vol s, MOD    63.7  ml A2C: LV vol s, MOD    57.5 ml A4C: LV SV MOD A2C:   16.9 ml LV SV MOD A4C:   70.6 ml LV SV MOD BP:    16.9 ml RIGHT VENTRICLE RV Basal diam:  4.00 cm RV Mid diam:    2.30 cm RV S prime:     7.65 cm/s TAPSE (M-mode): 1.3 cm LEFT ATRIUM  Index       RIGHT ATRIUM           Index LA diam:        3.70 cm 2.33 cm/m  RA Area:     10.30 cm LA Vol (A2C):   72.0 ml 45.40 ml/m RA Volume:   20.70 ml  13.05 ml/m LA Vol (A4C):   57.5 ml 36.26 ml/m LA Biplane Vol: 65.8 ml 41.49 ml/m  AORTIC VALVE                    PULMONIC VALVE AV Area (Vmax):    0.88 cm     PV Vmax:       0.66 m/s AV Area (Vmean):   0.84 cm     PV Vmean:      44.000 cm/s AV Area (VTI):     0.90 cm     PV VTI:        0.150 m AV Vmax:           162.33 cm/s  PV Peak grad:  1.7 mmHg AV Vmean:          106.333 cm/s PV Mean grad:  1.0 mmHg AV VTI:            0.335 m AV Peak Grad:      10.5 mmHg AV Mean Grad:      5.7 mmHg LVOT Vmax:         56.30 cm/s LVOT Vmean:        35.000 cm/s LVOT VTI:          0.119 m LVOT/AV VTI ratio: 0.35  AORTA Ao Asc diam: 2.90 cm MITRAL VALVE MV Area (PHT): 4.21 cm    SHUNTS MV Area VTI:   0.72 cm    Systemic VTI:  0.12 m MV Peak grad:  5.6 mmHg    Systemic Diam: 1.80 cm MV Mean grad:  2.0 mmHg MV Vmax:       1.18 m/s MV Vmean:      62.5 cm/s MV Decel Time: 180 msec MV E velocity: 95.10 cm/s MV A velocity: 66.00 cm/s MV E/A ratio:  1.44 Rudean Haskell MD Electronically signed by Rudean Haskell MD Signature Date/Time: 09/01/2020/12:58:07 PM    Final    ECHOCARDIOGRAM LIMITED  Result Date: 08/31/2020    ECHOCARDIOGRAM LIMITED REPORT   Patient Name:   RUWAYDA CURET Date of Exam: 08/31/2020 Medical Rec #:  [id number]         Height:       59.0 in Accession #:    [id number]        Weight:       127.6 lb Date of Birth:  February 04, 1935          BSA:          1.524 m Patient Age:    46 years          BP:           97/71 mmHg Patient Gender: F                 HR:           86 bpm. Exam Location:  Inpatient  Procedure: Limited Echo, Cardiac Doppler and Color Doppler Indications:     Aortic stenosis  History:         Patient has prior history of Echocardiogram examinations, most  recent 08/22/2020. CHF, Aortic Valve Disease; Risk                  Factors:Hypertension, Dyslipidemia and Current Smoker.  Sonographer:     Dustin Flock Referring Phys:  [id number] Woodfin Ganja THOMPSON Diagnosing Phys: [name] MD IMPRESSIONS  1. Note patient had brief period of EMD when valve was crossed. Responded to epi and short course of chest compression.  2. Left ventricular ejection fraction, by estimation, is 20 to 25%. The left ventricle has severely decreased function. The left ventricle demonstrates global hypokinesis. The left ventricular internal cavity size was severely dilated.  3. Left atrial size was moderately dilated.  4. The mitral valve is abnormal. Mild mitral valve regurgitation.  5. Pre TAVR: severely calcified tri leaflet AV with limited motion of the right coronary custp severe low gradient AS mean gradient 31 mmHg peak 51 mmHg AVA 0.26 cm2         Post TAVR: Well positioned 23 mm Sapien 3 valve No PVL mean gradient 2 peak 5 mmhg AVA 2.1 cm2 . The aortic valve is tricuspid. There is severe calcifcation of the aortic valve. Aortic valve regurgitation is mild. Severe aortic valve stenosis. Procedure Date: 08/31/2020. FINDINGS  Left Ventricle: Left ventricular ejection fraction, by estimation, is 20 to 25%. The left ventricle has severely decreased function. The left ventricle demonstrates global hypokinesis. The left ventricular internal cavity size was severely dilated. There is no left ventricular hypertrophy. Left Atrium: Left atrial size was moderately dilated. Pericardium: There is no evidence of pericardial effusion. Mitral Valve: The mitral valve is abnormal. There is moderate thickening of the mitral valve leaflet(s). There is moderate calcification of the mitral valve leaflet(s). Mild mitral  annular calcification. Mild mitral valve regurgitation. Aortic Valve: Pre TAVR: severely calcified tri leaflet AV with limited motion of the right coronary custp severe low gradient AS mean gradient 31 mmHg peak 51 mmHg AVA 0.26 cm2 Post TAVR: Well positioned 23 mm Sapien 3 valve No PVL mean gradient 2 peak 5 mmhg AVA 2.1 cm2. The aortic valve is tricuspid. There is severe calcifcation of the aortic valve. Aortic valve regurgitation is mild. Severe aortic stenosis is present. Aortic  valve mean gradient measures 2.5 mmHg. Aortic valve peak gradient measures 5.7 mmHg. Aortic valve area, by VTI measures 2.21 cm. There is a 23 mm Sapien prosthetic, stented (TAVR) valve present in the aortic position. Aorta: The aortic root is normal in size and structure. Additional Comments: Note patient had brief period of EMD when valve was crossed. Responded to epi and short course of chest compression. LEFT VENTRICLE PLAX 2D LVOT diam:     2.00 cm LV SV:         33 LV SV Index:   22 LVOT Area:     3.14 cm  AORTIC VALVE AV Area (Vmax):    2.20 cm AV Area (Vmean):   2.31 cm AV Area (VTI):     2.21 cm AV Vmax:           119.00 cm/s AV Vmean:          75.300 cm/s AV VTI:            0.151 m AV Peak Grad:      5.7 mmHg AV Mean Grad:      2.5 mmHg LVOT Vmax:         83.50 cm/s LVOT Vmean:        55.300 cm/s LVOT VTI:  0.106 m LVOT/AV VTI ratio: 0.70  SHUNTS Systemic VTI:  0.11 m Systemic Diam: 2.00 cm [name] MD Electronically signed by [name] MD Signature Date/Time: 08/31/2020/4:44:23 PM    Final    Structural Heart Procedure  Result Date: 08/31/2020 See surgical note for result.     Medical Consultants:   None.   Subjective:    Candie Echevaria relates she feels better today not hungry this morning but once to get up to the chair.  Objective:    Vitals:   09/01/20 2243 09/02/20 0445 09/02/20 0451 09/02/20 0453  BP: 129/81 (!) 91/41 (!) 92/50   Pulse: 89 82 81   Resp: 20 20 18    Temp:  98.8 F (37.1 C) 98.1 F (36.7 C)    TempSrc: Oral Oral    SpO2: 98% 99% 97%   Weight: 58.8 kg   58.8 kg  Height:       SpO2: 97 % O2 Flow Rate (L/min): 2 L/min   Intake/Output Summary (Last 24 hours) at 09/02/2020 0714 Last data filed at 09/01/2020 2241 Gross per 24 hour  Intake 1199.11 ml  Output 900 ml  Net 299.11 ml    Filed Weights   09/01/20 0500 09/01/20 2243 09/02/20 0453  Weight: 63.6 kg 58.8 kg 58.8 kg    Exam: General exam: In no acute distress. Respiratory system: Good air movement and clear to auscultation. Cardiovascular system: S1 & S2 heard, RRR. No JVD. Gastrointestinal system: Abdomen is nondistended, soft and nontender.  Extremities: No pedal edema. Skin: No rashes, lesions or ulcers Psychiatry: Judgement and insight appear normal. Mood & affect appropriate.   Data Reviewed:    Labs: Basic Metabolic Panel: Recent Labs  Lab 08/28/20 0304 08/30/20 0901 08/31/20 0234 09/01/20 0217 09/02/20 0105  NA 138 137 135 137 135  K 3.9 3.8 4.1 4.1 3.8  CL 107 109 106 108 104  CO2 20* 17* 20* 20* 23  GLUCOSE 107* 141* 101* 113* 92  BUN 13 18 19 14 14   CREATININE 0.95 1.12* 1.04* 0.83 0.98  CALCIUM 8.9 9.2 8.8* 8.4* 8.5*  MG  --   --   --  2.2  --     GFR Estimated Creatinine Clearance: 33.3 mL/min (by C-G formula based on SCr of 0.98 mg/dL). Liver Function Tests: No results for input(s): AST, ALT, ALKPHOS, BILITOT, PROT, ALBUMIN in the last 168 hours.  No results for input(s): LIPASE, AMYLASE in the last 168 hours. No results for input(s): AMMONIA in the last 168 hours. Coagulation profile Recent Labs  Lab 08/30/20 1633  INR 1.1   COVID-19 Labs  No results for input(s): DDIMER, FERRITIN, LDH, CRP in the last 72 hours.   Lab Results  Component Value Date   [place] NEGATIVE 08/21/2020    CBC: Recent Labs  Lab 08/31/20 0234 09/01/20 0217 09/02/20 0105  WBC 5.3 6.9 7.7  HGB 12.4 11.5* 11.0*  HCT 38.4 36.4 34.0*  MCV 90.4 91.7  90.7  PLT 265 209 175    Cardiac Enzymes: No results for input(s): CKTOTAL, CKMB, CKMBINDEX, TROPONINI in the last 168 hours. BNP (last 3 results) No results for input(s): PROBNP in the last 8760 hours. CBG: Recent Labs  Lab 09/01/20 1114 09/01/20 1611  GLUCAP 99 86   D-Dimer: No results for input(s): DDIMER in the last 72 hours.  Hgb A1c: Recent Labs    08/30/20 1633  HGBA1C 5.6    Lipid Profile: No results for input(s): CHOL, HDL, LDLCALC, TRIG,  CHOLHDL, LDLDIRECT in the last 72 hours.  Thyroid function studies: No results for input(s): TSH, T4TOTAL, T3FREE, THYROIDAB in the last 72 hours.  Invalid input(s): FREET3  Anemia work up: No results for input(s): VITAMINB12, FOLATE, FERRITIN, TIBC, IRON, RETICCTPCT in the last 72 hours. Sepsis Labs: Recent Labs  Lab 08/31/20 0234 09/01/20 0217 09/02/20 0105  WBC 5.3 6.9 7.7    Microbiology Recent Results (from the past 240 hour(s))  Surgical pcr screen     Status: None   Collection Time: 08/30/20  4:22 PM   Specimen: Nasal Mucosa; Nasal Swab  Result Value Ref Range Status   MRSA, PCR NEGATIVE NEGATIVE Final   Staphylococcus aureus NEGATIVE NEGATIVE Final    Comment: (NOTE) The Xpert SA Assay (FDA approved for NASAL specimens in patients 77 years of age and older), is one component of a comprehensive surveillance program. It is not intended to diagnose infection nor to guide or monitor treatment. Performed at [facility], [place] [address] [place], [place] [address]      Medications:    aspirin  81 mg Oral Daily   bisacodyl  5 mg Oral Once   cefdinir  300 mg Oral Q12H   Chlorhexidine Gluconate Cloth  6 each Topical Daily   clopidogrel  75 mg Oral Q breakfast   doxycycline  100 mg Oral Q12H   Continuous Infusions:  sodium chloride Stopped (09/01/20 0809)   sodium chloride     sodium chloride     lactated ringers Stopped (09/01/20 1036)   nitroGLYCERIN     phenylephrine (NEO-SYNEPHRINE) Adult  infusion        LOS: 12 days   Charlynne Cousins  [facility]  09/02/2020, 7:14 AM

## 2020-09-02 NOTE — Progress Notes (Signed)
Progress Note  Patient Name: Alexandria Randolph Date of Encounter: 09/02/2020  [facility] Cardiologist: Donato Heinz, MD   Subjective   Feels good this morning. Poor appetite. No CP or dyspnea.   Inpatient Medications    Scheduled Meds:  aspirin  81 mg Oral Daily   bisacodyl  5 mg Oral Once   cefdinir  300 mg Oral Q12H   Chlorhexidine Gluconate Cloth  6 each Topical Daily   clopidogrel  75 mg Oral Q breakfast   doxycycline  100 mg Oral Q12H   Continuous Infusions:  sodium chloride Stopped (09/01/20 0809)   sodium chloride     sodium chloride     lactated ringers Stopped (09/01/20 1036)   nitroGLYCERIN     phenylephrine (NEO-SYNEPHRINE) Adult infusion     PRN Meds: sodium chloride, sodium chloride, acetaminophen **OR** acetaminophen, morphine injection, ondansetron (ZOFRAN) IV, oxyCODONE, polyethylene glycol   Vital Signs    Vitals:   09/02/20 0445 09/02/20 0451 09/02/20 0453 09/02/20 0825  BP: (!) 91/41 (!) 92/50  (!) 92/51  Pulse: 82 81  80  Resp: 20 18  20   Temp: 98.1 F (36.7 C)   97.9 F (36.6 C)  TempSrc: Oral   Oral  SpO2: 99% 97%  98%  Weight:   58.8 kg   Height:        Intake/Output Summary (Last 24 hours) at 09/02/2020 0952 Last data filed at 09/01/2020 2241 Gross per 24 hour  Intake 1199.11 ml  Output 900 ml  Net 299.11 ml   Last 3 Weights 09/02/2020 09/01/2020 09/01/2020  Weight (lbs) 129 lb 10.1 oz 129 lb 10.1 oz 140 lb 3.4 oz  Weight (kg) 58.8 kg 58.8 kg 63.6 kg      Telemetry    Sinus rhythm, no significant arrhythmia - Personally Reviewed   Physical Exam  Alert, oriented, NAD GEN: No acute distress.   Neck: No JVD Cardiac: RRR, no murmurs, rubs, or gallops.  Respiratory: Clear to auscultation bilaterally. GI: Soft, nontender, non-distended  MS: No edema; No deformity. BL groin sites clear.  Neuro:  Nonfocal  Psych: Normal affect   Labs    High Sensitivity Troponin:   Recent Labs  Lab 08/21/20 1558  08/21/20 1834  TROPONINIHS 372* 389*      Chemistry Recent Labs  Lab 08/31/20 0234 09/01/20 0217 09/02/20 0105  NA 135 137 135  K 4.1 4.1 3.8  CL 106 108 104  CO2 20* 20* 23  GLUCOSE 101* 113* 92  BUN 19 14 14   CREATININE 1.04* 0.83 0.98  CALCIUM 8.8* 8.4* 8.5*  GFRNONAA 53* >60 57*  ANIONGAP 9 9 8      Hematology Recent Labs  Lab 08/31/20 0234 09/01/20 0217 09/02/20 0105  WBC 5.3 6.9 7.7  RBC 4.25 3.97 3.75*  HGB 12.4 11.5* 11.0*  HCT 38.4 36.4 34.0*  MCV 90.4 91.7 90.7  MCH 29.2 29.0 29.3  MCHC 32.3 31.6 32.4  RDW 12.5 12.1 12.4  PLT 265 209 175    BNPNo results for input(s): BNP, PROBNP in the last 168 hours.   DDimer No results for input(s): DDIMER in the last 168 hours.   Radiology    ECHOCARDIOGRAM COMPLETE  Result Date: 09/01/2020    ECHOCARDIOGRAM REPORT   Patient Name:   Alexandria Randolph Date of Exam: 09/01/2020 Medical Rec #:  [id number]       Height:       59.0 in Accession #:    [id number]  Weight:       140.2 lb Date of Birth:  14-Jun-1934        BSA:          1.586 m Patient Age:    86 years        BP:           101/55 mmHg Patient Gender: F               HR:           63 bpm. Exam Location:  Inpatient Procedure: 2D Echo, Cardiac Doppler and Color Doppler Indications:    S/P TAVR  History:        Patient has prior history of Echocardiogram examinations, most                 recent 08/31/2020. Signs/Symptoms:Murmur; Risk                 Factors:Dyslipidemia and Hypertension. 08/31/20 TAVR                 08/23/20 cath.  Sonographer:    [place] Referring Phys: [id number] [place]  1. The aortic valve has been repaired/replaced. Well seated 23 mm Sapien 3 Valve with DVI 0.35, no paravalvular leak, and with appropriate gradients and acceleration time. Aortic valve regurgitation is not visualized. No aortic stenosis is present. Aortic valve mean gradient measures 5.7 mmHg. Aortic valve Vmax measures 1.62 m/s. Aortic valve acceleration  time measures 80 msec.  2. Left ventricular ejection fraction, by estimation, is 20 to 25%. Left ventricular ejection fraction by 2D MOD biplane is 21.5 %. The left ventricle has severely decreased function. The left ventricle demonstrates global hypokinesis. Left ventricular diastolic parameters are consistent with Grade II diastolic dysfunction (pseudonormalization).  3. Right ventricular systolic function is mildly reduced. The right ventricular size is normal. Tricuspid regurgitation signal is inadequate for assessing PA pressure.  4. Left atrial size was moderately dilated.  5. The mitral valve is abnormal. Trivial mitral valve regurgitation. No evidence of mitral stenosis.  6. The inferior vena cava is normal in size with greater than 50% respiratory variability, suggesting right atrial pressure of 3 mmHg. Comparison(s): A prior study was performed on 08/31/20. Prior images reviewed side by side. Stable TAVR positioning. FINDINGS  Left Ventricle: Left ventricular ejection fraction, by estimation, is 20 to 25%. Left ventricular ejection fraction by 2D MOD biplane is 21.5 %. The left ventricle has severely decreased function. The left ventricle demonstrates global hypokinesis. The left ventricular internal cavity size was normal in size. There is no left ventricular hypertrophy. Left ventricular diastolic parameters are consistent with Grade II diastolic dysfunction (pseudonormalization). Right Ventricle: The right ventricular size is normal. Right vetricular wall thickness was not well visualized. Right ventricular systolic function is mildly reduced. Tricuspid regurgitation signal is inadequate for assessing PA pressure. Left Atrium: Left atrial size was moderately dilated. Right Atrium: Right atrial size was normal in size. Pericardium: There is no evidence of pericardial effusion. Mitral Valve: The mitral valve is abnormal. Mild to moderate mitral annular calcification. Trivial mitral valve regurgitation. No  evidence of mitral valve stenosis. MV peak gradient, 5.6 mmHg. The mean mitral valve gradient is 2.0 mmHg. Tricuspid Valve: The tricuspid valve is grossly normal. Tricuspid valve regurgitation is trivial. Aortic Valve: The aortic valve has been repaired/replaced. Aortic valve regurgitation is not visualized. No aortic stenosis is present. Aortic valve mean gradient measures 5.7 mmHg. Aortic valve peak gradient measures 10.5  mmHg. Aortic valve area, by VTI measures 0.90  cm. There is a Sapien prosthetic, stented (TAVR) valve present in the aortic position. Pulmonic Valve: The pulmonic valve was grossly normal. Pulmonic valve regurgitation is not visualized. No evidence of pulmonic stenosis. Aorta: The aortic root and ascending aorta are structurally normal, with no evidence of dilitation. Venous: The inferior vena cava is normal in size with greater than 50% respiratory variability, suggesting right atrial pressure of 3 mmHg. IAS/Shunts: The atrial septum is grossly normal.  LEFT VENTRICLE PLAX 2D                        Biplane EF (MOD) LVIDd:         5.20 cm         LV Biplane EF:   Left LVIDs:         4.00 cm                          ventricular LV PW:         0.70 cm                          ejection LV IVS:        1.00 cm                          fraction by LVOT diam:     1.80 cm                          2D MOD LV SV:         30                               biplane is LV SV Index:   19                               21.5 %. LVOT Area:     2.54 cm                                Diastology                                LV e' medial:    4.05 cm/s LV Volumes (MOD)               LV E/e' medial:  23.5 LV vol d, MOD    80.6 ml       LV e' lateral:   6.64 cm/s A2C:                           LV E/e' lateral: 14.3 LV vol d, MOD    70.6 ml A4C: LV vol s, MOD    63.7 ml A2C: LV vol s, MOD    57.5 ml A4C: LV SV MOD A2C:   16.9 ml LV SV MOD A4C:   70.6 ml LV SV MOD BP:    16.9 ml RIGHT VENTRICLE RV Basal diam:  4.00 cm RV  Mid diam:    2.30 cm RV S prime:  7.65 cm/s TAPSE (M-mode): 1.3 cm LEFT ATRIUM             Index       RIGHT ATRIUM           Index LA diam:        3.70 cm 2.33 cm/m  RA Area:     10.30 cm LA Vol (A2C):   72.0 ml 45.40 ml/m RA Volume:   20.70 ml  13.05 ml/m LA Vol (A4C):   57.5 ml 36.26 ml/m LA Biplane Vol: 65.8 ml 41.49 ml/m  AORTIC VALVE                    PULMONIC VALVE AV Area (Vmax):    0.88 cm     PV Vmax:       0.66 m/s AV Area (Vmean):   0.84 cm     PV Vmean:      44.000 cm/s AV Area (VTI):     0.90 cm     PV VTI:        0.150 m AV Vmax:           162.33 cm/s  PV Peak grad:  1.7 mmHg AV Vmean:          106.333 cm/s PV Mean grad:  1.0 mmHg AV VTI:            0.335 m AV Peak Grad:      10.5 mmHg AV Mean Grad:      5.7 mmHg LVOT Vmax:         56.30 cm/s LVOT Vmean:        35.000 cm/s LVOT VTI:          0.119 m LVOT/AV VTI ratio: 0.35  AORTA Ao Asc diam: 2.90 cm MITRAL VALVE MV Area (PHT): 4.21 cm    SHUNTS MV Area VTI:   0.72 cm    Systemic VTI:  0.12 m MV Peak grad:  5.6 mmHg    Systemic Diam: 1.80 cm MV Mean grad:  2.0 mmHg MV Vmax:       1.18 m/s MV Vmean:      62.5 cm/s MV Decel Time: 180 msec MV E velocity: 95.10 cm/s MV A velocity: 66.00 cm/s MV E/A ratio:  1.44 Rudean Haskell MD Electronically signed by Rudean Haskell MD Signature Date/Time: 09/01/2020/12:58:07 PM    Final    ECHOCARDIOGRAM LIMITED  Result Date: 08/31/2020    ECHOCARDIOGRAM LIMITED REPORT   Patient Name:   Alexandria Randolph Date of Exam: 08/31/2020 Medical Rec #:  [id number]         Height:       59.0 in Accession #:    [id number]        Weight:       127.6 lb Date of Birth:  June 02, 1934          BSA:          1.524 m Patient Age:    89 years          BP:           97/71 mmHg Patient Gender: F                 HR:           86 bpm. Exam Location:  Inpatient Procedure: Limited Echo, Cardiac Doppler and Color Doppler Indications:     Aortic stenosis  History:         Patient has prior  history of Echocardiogram  examinations, most                  recent 08/22/2020. CHF, Aortic Valve Disease; Risk                  Factors:Hypertension, Dyslipidemia and Current Smoker.  Sonographer:     Dustin Flock Referring Phys:  [id number] Woodfin Ganja THOMPSON Diagnosing Phys: [name] MD IMPRESSIONS  1. Note patient had brief period of EMD when valve was crossed. Responded to epi and short course of chest compression.  2. Left ventricular ejection fraction, by estimation, is 20 to 25%. The left ventricle has severely decreased function. The left ventricle demonstrates global hypokinesis. The left ventricular internal cavity size was severely dilated.  3. Left atrial size was moderately dilated.  4. The mitral valve is abnormal. Mild mitral valve regurgitation.  5. Pre TAVR: severely calcified tri leaflet AV with limited motion of the right coronary custp severe low gradient AS mean gradient 31 mmHg peak 51 mmHg AVA 0.26 cm2         Post TAVR: Well positioned 23 mm Sapien 3 valve No PVL mean gradient 2 peak 5 mmhg AVA 2.1 cm2 . The aortic valve is tricuspid. There is severe calcifcation of the aortic valve. Aortic valve regurgitation is mild. Severe aortic valve stenosis. Procedure Date: 08/31/2020. FINDINGS  Left Ventricle: Left ventricular ejection fraction, by estimation, is 20 to 25%. The left ventricle has severely decreased function. The left ventricle demonstrates global hypokinesis. The left ventricular internal cavity size was severely dilated. There is no left ventricular hypertrophy. Left Atrium: Left atrial size was moderately dilated. Pericardium: There is no evidence of pericardial effusion. Mitral Valve: The mitral valve is abnormal. There is moderate thickening of the mitral valve leaflet(s). There is moderate calcification of the mitral valve leaflet(s). Mild mitral annular calcification. Mild mitral valve regurgitation. Aortic Valve: Pre TAVR: severely calcified tri leaflet AV with limited motion of the right coronary  custp severe low gradient AS mean gradient 31 mmHg peak 51 mmHg AVA 0.26 cm2 Post TAVR: Well positioned 23 mm Sapien 3 valve No PVL mean gradient 2 peak 5 mmhg AVA 2.1 cm2. The aortic valve is tricuspid. There is severe calcifcation of the aortic valve. Aortic valve regurgitation is mild. Severe aortic stenosis is present. Aortic  valve mean gradient measures 2.5 mmHg. Aortic valve peak gradient measures 5.7 mmHg. Aortic valve area, by VTI measures 2.21 cm. There is a 23 mm Sapien prosthetic, stented (TAVR) valve present in the aortic position. Aorta: The aortic root is normal in size and structure. Additional Comments: Note patient had brief period of EMD when valve was crossed. Responded to epi and short course of chest compression. LEFT VENTRICLE PLAX 2D LVOT diam:     2.00 cm LV SV:         33 LV SV Index:   22 LVOT Area:     3.14 cm  AORTIC VALVE AV Area (Vmax):    2.20 cm AV Area (Vmean):   2.31 cm AV Area (VTI):     2.21 cm AV Vmax:           119.00 cm/s AV Vmean:          75.300 cm/s AV VTI:            0.151 m AV Peak Grad:      5.7 mmHg AV Mean Grad:      2.5 mmHg LVOT Vmax:  83.50 cm/s LVOT Vmean:        55.300 cm/s LVOT VTI:          0.106 m LVOT/AV VTI ratio: 0.70  SHUNTS Systemic VTI:  0.11 m Systemic Diam: 2.00 cm [name] MD Electronically signed by [name] MD Signature Date/Time: 08/31/2020/4:44:23 PM    Final    Structural Heart Procedure  Result Date: 08/31/2020 See surgical note for result.   Cardiac Studies   2D Echo (POD#1 study): IMPRESSIONS     1. The aortic valve has been repaired/replaced. Well seated 23 mm Sapien  3 Valve with DVI 0.35, no paravalvular leak, and with appropriate  gradients and acceleration time. Aortic valve regurgitation is not  visualized. No aortic stenosis is present.  Aortic valve mean gradient measures 5.7 mmHg. Aortic valve Vmax measures  1.62 m/s. Aortic valve acceleration time measures 80 msec.   2. Left ventricular ejection  fraction, by estimation, is 20 to 25%. Left  ventricular ejection fraction by 2D MOD biplane is 21.5 %. The left  ventricle has severely decreased function. The left ventricle demonstrates  global hypokinesis. Left ventricular  diastolic parameters are consistent with Grade II diastolic dysfunction  (pseudonormalization).   3. Right ventricular systolic function is mildly reduced. The right  ventricular size is normal. Tricuspid regurgitation signal is inadequate  for assessing PA pressure.   4. Left atrial size was moderately dilated.   5. The mitral valve is abnormal. Trivial mitral valve regurgitation. No  evidence of mitral stenosis.   6. The inferior vena cava is normal in size with greater than 50%  respiratory variability, suggesting right atrial pressure of 3 mmHg.   Comparison(s): A prior study was performed on 08/31/20. Prior images  reviewed side by side. Stable TAVR positioning.  Patient Profile     86 y.o. female with acute systolic heart failure in the setting of critical aortic stenosis and severe LV dysfunction, treated with TAVR August 31, 2020  Assessment & Plan    Critical AS: s/p TAVR now POD #2, progressing very well, post-op echo looks good with mean gradient 6 mmHg and no AI. Continue ASA and plavix. Acute systolic HF: LVEF 39-03%. BP marginal and won't tolerate beta blocker, ACE/ARB/Entresto at this time. Hold diuretics now that AS is corrected. BP 00-92 mmHg systolic but no symptoms at present. Mobilize as tolerated.  AF with RVR: transient at time of TAVoff of amiodarone, no recurrence, observe on tele another 24 hours.  Dispo: mobilize today, encourage PO intake, approaching stability for DC, likely tomorrow am. Discussed with patient and her friend at bedside. Appreciate TRH care of Dr Aileen Fass.    For questions or updates, please contact [place] Please consult [email or web address] for contact info under        Signed, Sherren Mocha, MD  09/02/2020,  9:52 AM

## 2020-09-02 NOTE — Progress Notes (Signed)
Heart Failure Stewardship Pharmacist Progress Note   PCP: Marrian Salvage, FNP PCP-Cardiologist: Donato Heinz, MD    HPI:  86 yo F w/ PMH of HTN, HLD, remote light tobacco use, and breast cancer on letrozole who presented to [facility] ED 6/4 complaining of severe worsening SOB. CXR showed no edema, CT revealed consolidation in right lower lobe. Diuresed w/ IV Lasix 40mg  and out 1L. BNP 2105. ECHO 6/5 revealed EF 20-25% and severe AS. Elevated troponin (380) and underwent cath 6/6 revealing minimal CAD with severe AS. RA 8/6, PAP 56/15, PCWP 15, LVEDP 38, CI 2.1. Notes allergy to losartan and olmesartan w/ SOB.  Underwent TAVR 6/14.   Current HF Medications: - None  Prior to admission HF Medications: - None  Pertinent Lab Values: Serum creatinine 0.98, BUN 14, Potassium 3.8, Sodium 135, BNP 2105 Vital Signs: Weight: 58.8kg (admission weight: 56.7kg) Blood pressure: 118/42 Heart rate: 80s  Medication Assistance / Insurance Benefits Check: Does the patient have prescription insurance?  Yes Type of insurance plan: [place]  Does the patient qualify for medication assistance through manufacturers or grants?   Yes - can utilize copay cards   Outpatient Pharmacy:  [facility] admission [facility] Is the patient willing to use [facility] pharmacy at discharge? Yes Is the patient willing to transition their outpatient pharmacy to utilize a [facility] outpatient pharmacy?   Pending    Assessment: 1. New onset systolic CHF (EF 78-67%), due to NICM. NYHA class III-IV. - Underwent TAVR 6/14 - Furosemide and Jardiance held now that AS was corrected - With low BP, limits GDMT opportunities - Consider restarting Jardiance, will not impact BP  Plan: 1) Medication changes recommended at this time: - Restart Jardiance 10mg  daily  2) Patient assistance: - Jardiance PA approved through 08/24/2021, co-pay will be $27.32/mo - Co-pay card will  decrease cost to $10/1-12mo  3)  Education  - Patient has been educated on current HF medications (Lasix and Jardiance). - Patient verbalizes understanding that over the next few months, these medication doses may change and more medications may be added to optimize HF regimen - Patient has been educated on basic disease state pathophysiology and goals of therapy - Time spent (43min) - HF TOC appt rescheduled for 6/21 given TAVR next week   Marya Fossa (Sean) [place], PharmD Student

## 2020-09-02 NOTE — Progress Notes (Signed)
Physical Therapy Treatment Patient Details Name: Alexandria Randolph MRN: [id number] DOB: January 01, 1935 Today's Date: 09/02/2020    History of Present Illness Patient is a 86 y/o female who presents on 08/21/20 as a tx from [facility] due to SOB and weakness. BNP 2105. Chest CTA- multifocal PNA, bil pleral effusions and possible bronchitis. Concern for new CHF, aortic stenosis, Elevated D-dimer.  s/p cardiac cath 08/23/20; TAVR planned for 08/31/20. PMH includes HTN, dyslipidemia, breast ca, tobacco abuse.    PT Comments    Pt continues to show steady progress toward goals at a similar level post TAVR than pre-procedure.  Emphasis today on transition to EOB, sit to stand, progression of gait with the RW. Session limited by mild SOB and mild fatigue.  Pt unable to make it to the stairwell to attempt stair training due to fatigue.    Follow Up Recommendations  Home health PT;Supervision - Intermittent     Equipment Recommendations  Rolling walker with 5" wheels;Other (comment) (pending improvement)    Recommendations for Other Services       Precautions / Restrictions Precautions Precautions: Fall    Mobility  Bed Mobility Overal bed mobility: Modified Independent             General bed mobility comments: flat bed, no rails.  Pt in/out of bed without assist    Transfers Overall transfer level: Needs assistance Equipment used: Rolling walker (2 wheeled) Transfers: Sit to/from Stand Sit to Stand: Min guard            Ambulation/Gait Ambulation/Gait assistance: Min guard Gait Distance (Feet): 120 Feet Assistive device: Rolling walker (2 wheeled) Gait Pattern/deviations: Step-through pattern Gait velocity: decreased Gait velocity interpretation: <1.8 ft/sec, indicate of risk for recurrent falls General Gait Details: generally steady and slower. Cues for proximity to the RW.  Pt was mildly SOB with gait, but VSS overall   Stairs             Wheelchair Mobility     Modified Rankin (Stroke Patients Only)       Balance   Sitting-balance support: Feet supported;No upper extremity supported Sitting balance-Leahy Scale: Good     Standing balance support: During functional activity Standing balance-Leahy Scale: Fair Standing balance comment: pt able to stand statically without UER assist, but preferred the RW today due to dizziness.                            Cognition Arousal/Alertness: Awake/alert Behavior During Therapy: WFL for tasks assessed/performed Overall Cognitive Status: Within Functional Limits for tasks assessed                                        Exercises      General Comments General comments (skin integrity, edema, etc.): vss, but pt expressed some minor lightheadedness.      Pertinent Vitals/Pain Pain Assessment: Faces Faces Pain Scale: No hurt Pain Intervention(s): Monitored during session    Home Living                      Prior Function            PT Goals (current goals can now be found in the care plan section) Acute Rehab PT Goals PT Goal Formulation: With patient Time For Goal Achievement: 09/06/20 Potential to Achieve Goals: Good Progress towards PT  goals: Progressing toward goals (Continue goals as written prior to TAVR procedure.)    Frequency    Min 3X/week      PT Plan Current plan remains appropriate    Co-evaluation              AM-PAC PT "6 Clicks" Mobility   Outcome Measure  Help needed turning from your back to your side while in a flat bed without using bedrails?: None Help needed moving from lying on your back to sitting on the side of a flat bed without using bedrails?: None Help needed moving to and from a bed to a chair (including a wheelchair)?: A Little Help needed standing up from a chair using your arms (e.g., wheelchair or bedside chair)?: A Little Help needed to walk in hospital room?: A Little Help needed climbing 3-5  steps with a railing? : A Little 6 Click Score: 20    End of Session Equipment Utilized During Treatment: Gait belt Activity Tolerance: Patient tolerated treatment well;Patient limited by fatigue Patient left: in bed;with call bell/phone within reach;with bed alarm set Nurse Communication: Mobility status PT Visit Diagnosis: Unsteadiness on feet (R26.81);Difficulty in walking, not elsewhere classified (R26.2)     Time: 5427-0623 PT Time Calculation (min) (ACUTE ONLY): 19 min  Charges:  $Gait Training: 8-22 mins                     09/02/2020  Alexandria Randolph., PT Acute Rehabilitation Services [phone]  (pager) [phone]  (office)   Alexandria Randolph 09/02/2020, 4:35 PM

## 2020-09-02 NOTE — Progress Notes (Signed)
Pt was transferred from [facility] to 4E14.  She 's alert and oriented x 4, able to ambulate with one standby assisted.  Hemodynamics stable. NSR on monitor. BP within normal limits. Remained afebrile. Right and left groin gauze dressing dry and clean, no hematoma noted.   Room oriented, CHG bath given, call bell in reached, CCMD called with 2nd person verified. No acute distress at arrival. We will monitor.  Kennyth Lose, RN

## 2020-09-02 NOTE — Progress Notes (Signed)
Mobility Specialist: Progress Note   09/02/20 1656  Mobility  Activity Ambulated in hall  Level of Assistance Minimal assist, patient does 75% or more  Assistive Device Front wheel walker  Distance Ambulated (ft) 130 ft  Mobility Ambulated with assistance in hallway  Mobility Response Tolerated well  Mobility performed by Mobility specialist  $Mobility charge 1 Mobility   Pre-Mobility: 84 HR, 97% SpO2 Post-Mobility: 89 HR, 98% SpO2  Pt requesting to use BR and then agreeable to ambulate, void successful. Pt c/o BLE weakness during ambulation, otherwise asx. Pt back to bed after walk with call bell at her side.   [facility] Tomisha Reppucci Mobility Specialist Mobility Specialist Phone: [phone]

## 2020-09-03 ENCOUNTER — Other Ambulatory Visit (HOSPITAL_COMMUNITY): Payer: Self-pay

## 2020-09-03 DIAGNOSIS — I5021 Acute systolic (congestive) heart failure: Secondary | ICD-10-CM | POA: Diagnosis not present

## 2020-09-03 DIAGNOSIS — I350 Nonrheumatic aortic (valve) stenosis: Secondary | ICD-10-CM | POA: Diagnosis not present

## 2020-09-03 LAB — BASIC METABOLIC PANEL
Anion gap: 7 (ref 5–15)
BUN: 11 mg/dL (ref 8–23)
CO2: 23 mmol/L (ref 22–32)
Calcium: 8.4 mg/dL — ABNORMAL LOW (ref 8.9–10.3)
Chloride: 105 mmol/L (ref 98–111)
Creatinine, Ser: 0.82 mg/dL (ref 0.44–1.00)
GFR, Estimated: >60 mL/min (ref >60)
Glucose, Bld: 83 mg/dL (ref 70–99)
Potassium: 3.5 mmol/L (ref 3.5–5.1)
Sodium: 135 mmol/L (ref 135–145)

## 2020-09-03 LAB — CBC
HCT: 32.9 % — ABNORMAL LOW (ref 36.0–46.0)
Hemoglobin: 10.7 g/dL — ABNORMAL LOW (ref 12.0–15.0)
MCH: 29.2 pg (ref 26.0–34.0)
MCHC: 32.5 g/dL (ref 30.0–36.0)
MCV: 89.6 fL (ref 80.0–100.0)
Platelets: 174 10*3/uL (ref 150–400)
RBC: 3.67 MIL/uL — ABNORMAL LOW (ref 3.87–5.11)
RDW: 12.4 % (ref 11.5–15.5)
WBC: 6.2 10*3/uL (ref 4.0–10.5)
nRBC: 0 % (ref 0.0–0.2)

## 2020-09-03 MED ORDER — ASPIRIN 81 MG PO CHEW
81.0000 mg | CHEWABLE_TABLET | Freq: Every day | ORAL | 3 refills | Status: DC
  Filled 2020-09-03: qty 30, 30d supply, fill #0
  Filled 2020-09-28: qty 30, 30d supply, fill #1

## 2020-09-03 MED ORDER — CEFDINIR 300 MG PO CAPS
300.0000 mg | ORAL_CAPSULE | Freq: Two times a day (BID) | ORAL | 0 refills | Status: AC
  Filled 2020-09-03: qty 6, 3d supply, fill #0

## 2020-09-03 MED ORDER — DOXYCYCLINE HYCLATE 100 MG PO TABS
100.0000 mg | ORAL_TABLET | Freq: Two times a day (BID) | ORAL | 0 refills | Status: AC
  Filled 2020-09-03: qty 6, 3d supply, fill #0

## 2020-09-03 MED ORDER — CLOPIDOGREL BISULFATE 75 MG PO TABS
75.0000 mg | ORAL_TABLET | Freq: Every day | ORAL | 3 refills | Status: DC
Start: 2020-09-04 — End: 2020-10-04
  Filled 2020-09-03: qty 30, 30d supply, fill #0
  Filled 2020-09-28: qty 30, 30d supply, fill #1

## 2020-09-03 NOTE — Progress Notes (Signed)
CARDIAC REHAB PHASE I   Pt with recent return from working with PT. Pt states breathing is better, still with some weakness but that is getting better. Reviewed site care, restrictions, and exercise guidelines with emphasis on safety. Pt waiting on walker for d/c. Pt denies questions or concerns at this time.  [id number] Rufina Falco, RN BSN 09/03/2020 12:02 PM

## 2020-09-03 NOTE — Progress Notes (Signed)
Pt discharged from unit. Medication/discharge instruction given  Maui Ahart K Quynn Vilchis, RN  

## 2020-09-03 NOTE — Discharge Summary (Signed)
Physician Discharge Summary  Alexandria Randolph:[id number] DOB: May 28, 1934 DOA: 08/21/2020  PCP: Marrian Salvage, FNP  Admit date: 08/21/2020 Discharge date: 09/03/2020  Admitted From: home Disposition:  home  Recommendations for Outpatient Follow-up:  Follow up with Cardsin 1-2 weeks Please obtain BMP/CBC in one week She will need a repeat follow-up in 4 weeks etiologies note is a possible possible mass on her pre TAVR CT scans she was started empirically on antibiotics for 5 days and she will need to follow-up scan to see resolution and rule out neoplasm.   Home Health:Yes Equipment/Devices:Rolling walker  Discharge Condition:Stable CODE STATUS:Full Diet recommendation: Heart Healthy   Brief/Interim Summary:  86 y.o. female past medical history of breast cancer currently on letrozole, remote history of tobacco abuse comes in with 1 to 2 weeks of shortness of breath on exertion.  2D echo was done that showed an EF of 25% and grade 2 diastolic heart failure, with severe aortic stenosis with a gradient of 40 mmHg, cardiology recommended transfer to [facility].  Discharge Diagnoses:  Principal Problem:   Acute CHF (congestive heart failure) (HCC) Active Problems:   Malignant neoplasm of upper-outer quadrant of right breast in female, estrogen receptor positive (HCC)   Elevated d-dimer   Severe aortic stenosis   S/P TAVR (transcatheter aortic valve replacement)  Critical aortic stenosis: 2D showed critical AAS.  Cardiology was consulted she status post TAVR on 09/01/2018 she required minimal support with pressors and she was given amiodarone due to transient atrial fibrillation. There recommended to continue aspirin and Plavix. Follow-up with them as an outpatient. Physical therapy evaluated the patient and recommended home health PT. During the procedure the radiologist noted a possible mass on her pre-TAVR CT scan which will need follow-up in 4 weeks she was treated empirically with  Doxy and Omnicef for 5 days.  Follow-up with a CT scan in 4 weeks to rule out malignancy.  Transient A. fib with RVR: This occurs with during TAVR's procedure after receiving epinephrine. She was loaded with amiodarone now in sinus rhythm noted was discontinued she remained in sinus rhythm. Throughout her hospital stay she remained in sinus rhythm she was discharged on no rhythm controlling agents.  Acute on chronic systolic heart failure: She was managed conservatively with IV Lasix she appears euvolemic on day of discharge.  Elevated troponins: Likely demand ischemia in the setting of critical severe aortic stenosis that heparin was discontinued.  History of breast cancer: Antineoplastic medications were held she will continue to follow-up with her oncology as an outpatient. It is to note that in her pre-TAVR CT scan she had a possible mass she was treated empirically with antibiotics will need to follow-up in 4 weeks with a CT scan of the chest to rule out malignancy.  Elevated D-dimer: CT angio of the chest was negative for PE lower extremity Doppler was negative for DVT.  Discharge Instructions  Discharge Instructions     Diet - low sodium heart healthy   Complete by: As directed    Increase activity slowly   Complete by: As directed    No wound care   Complete by: As directed       Allergies as of 09/03/2020       Reactions   Losartan Potassium    REACTION: sob   Olmesartan Medoxomil    REACTION: sob   Simvastatin    REACTION: Malgias        Medication List     TAKE these medications  aspirin 81 MG chewable tablet Chew 1 tablet (81 mg total) by mouth daily. Start taking on: September 04, 2020   cefdinir 300 MG capsule Commonly known as: OMNICEF Take 1 capsule (300 mg total) by mouth every 12 (twelve) hours for 3 days.   clopidogrel 75 MG tablet Commonly known as: PLAVIX Take 1 tablet (75 mg total) by mouth daily with breakfast. Start taking on: September 04, 2020   doxycycline 100 MG tablet Commonly known as: VIBRA-TABS Take 1 tablet (100 mg total) by mouth every 12 (twelve) hours for 3 days.   fexofenadine 180 MG tablet Commonly known as: ALLEGRA Take 180 mg by mouth daily.   letrozole 2.5 MG tablet Commonly known as: FEMARA Take 1 tablet (2.5 mg total) by mouth daily.   SYSTANE FREE OP Place 1 drop into both eyes daily as needed (For dry eyes).               Durable Medical Equipment  (From admission, onward)           Start     Ordered   09/03/20 0950  For home use only DME Walker rolling  Once       Question Answer Comment  Walker: With 5 Inch Wheels   Patient needs a walker to treat with the following condition General weakness      09/03/20 0950            Follow-up Information     [facility] Go on 09/07/2020.   Specialty: Cardiology Why: @ 10am for post hospital follow up. garage code [id number] Contact information: [address] [id number] [place] [address] [phone]               Allergies  Allergen Reactions   Losartan Potassium     REACTION: sob   Olmesartan Medoxomil     REACTION: sob   Simvastatin     REACTION: Malgias    Consultations: Cardiology Cardiothoracic surgery    Procedures/Studies: DG Chest 2 View  Result Date: 08/30/2020 CLINICAL DATA:  Preoperative evaluation EXAM: CHEST - 2 VIEW COMPARISON:  08/21/2020 FINDINGS: Borderline enlargement of cardiac silhouette. Mediastinal contours and pulmonary vascularity normal. Atelectasis versus infiltrate at medial RIGHT lower lobe. Small LEFT pleural effusion. Remaining lungs clear. No pneumothorax. Bones demineralized with degenerative changes of the thoracic spine and LEFT glenohumeral joint. IMPRESSION: Borderline enlargement of cardiac silhouette with atelectasis versus consolidation in medial RIGHT lower lobe. Small LEFT pleural effusion. Electronically  Signed   By: Lavonia Dana M.D.   On: 08/30/2020 17:09   CT Angio Chest PE W/Cm &/Or Wo Cm  Result Date: 08/21/2020 CLINICAL DATA:  Shortness of breath and elevated D-dimer levels. EXAM: CT ANGIOGRAPHY CHEST WITH CONTRAST TECHNIQUE: Multidetector CT imaging of the chest was performed using the standard protocol during bolus administration of intravenous contrast. Multiplanar CT image reconstructions and MIPs were obtained to evaluate the vascular anatomy. CONTRAST:  64mL OMNIPAQUE IOHEXOL 350 MG/ML SOLN COMPARISON:  Chest radiograph 08/21/2020 FINDINGS: Despite efforts by the technologist and patient, motion artifact is present on today's exam and could not be eliminated. This reduces exam sensitivity and specificity. Cardiovascular: No filling defect is identified in the pulmonary arterial tree to suggest pulmonary embolus. Coronary, aortic arch, and branch vessel atherosclerotic vascular disease. The aorta and systemic vasculature are not opacified on today's exam which was optimized for pulmonary arterial opacification. Mild cardiomegaly.  Aortic valve calcification. Mediastinum/Nodes: Unremarkable Lungs/Pleura: Small  bilateral pleural effusions. Airway thickening is present, suggesting bronchitis or reactive airways disease. Consolidation in the right lower lobe and to lesser extent posteriorly in the right upper lobe noted on images 42 through 83 of series 5. Upper Abdomen: Unremarkable Musculoskeletal: Severe left degenerative glenohumeral arthropathy. Thoracic spondylosis and thoracic kyphosis. Age indeterminate mild superior endplate compression fracture at L1. Review of the MIP images confirms the above findings. IMPRESSION: 1. No filling defect is identified in the pulmonary arterial tree to suggest pulmonary embolus. 2. Consolidation in the right lower lobe and to a lesser extent posteriorly in the right upper lobe concerning for multilobar pneumonia. Followup PA and lateral chest X-ray is recommended in  3-4 weeks following trial of antibiotic therapy to ensure resolution and exclude underlying malignancy. 3. Other imaging findings of potential clinical significance: Aortic Atherosclerosis (ICD10-I70.0). Coronary atherosclerosis. Aortic valve calcification. Mild cardiomegaly. Small bilateral pleural effusions. Airway thickening is present, suggesting bronchitis or reactive airways disease. Severe left degenerative glenohumeral arthropathy. Age indeterminate mild superior endplate compression fracture at L1. Electronically Signed   By: Van Clines M.D.   On: 08/21/2020 12:55   CARDIAC CATHETERIZATION  Result Date: 08/23/2020  Ost LM lesion is 30% stenosed.  Hemodynamic findings consistent with aortic valve stenosis.  NEVADA KIRCHNER is a 86 y.o. female  [id number] LOCATION:  FACILITY: [facility] PHYSICIAN: Quay Burow, M.D. August 21, 1934 DATE OF PROCEDURE:  08/23/2020 DATE OF DISCHARGE: CARDIAC CATHETERIZATION History obtained from chart review.  Ms. Figge is an 86 year old thin appearing African-American female who presented 2 days ago with heart failure.  Her BNP was over 2000 and her troponin was 300.  She did have diffuse T wave inversion.  She was diuresed.  2D echo revealed an ejection fraction in the 20% range with severe aortic stenosis.  She presents now for right left heart cath to define her anatomy and physiology.   Ms. Haliburton has minimal CAD with at most 48 to 40% ostial left main.  She has critical AS with a peak to peak gradient of 72 mmHg and a valve area of 0.39 cm indexed to 0.25 cm/m.  She has severe LV dysfunction.  She may be a good candidate for TAVR.  Her LVEDP was elevated at 38 and she received 40 mg of Lasix IV prior to leaving the Cath Lab.  Dr. Johnsie Cancel, her attending cardiologist, was notified of these results.  The sheaths were removed and MYNX closure devices were deployed in both the right common femoral artery and vein achieving hemostasis.  The patient left lab in stable  condition. Quay Burow. MD, [facility] 08/23/2020 4:59 PM   CT CORONARY MORPH W/CTA COR W/SCORE W/CA W/CM &/OR WO/CM  Addendum Date: 08/26/2020   ADDENDUM REPORT: 08/26/2020 08:56 EXAM: OVER-READ INTERPRETATION  CT CHEST The following report is an over-read performed by radiologist Dr. Samara Snide [facility] [place] on 08/26/2020. This over-read does not include interpretation of cardiac or coronary anatomy or pathology. The coronary CTA interpretation by the cardiologist is attached. COMPARISON:  08/21/2020 chest CT angiogram. FINDINGS: Please see the separate concurrent chest CT angiogram report for details. IMPRESSION: Please see the separate concurrent chest CT angiogram report for details. Electronically Signed   By: Ilona Sorrel M.D.   On: 08/26/2020 08:56   Result Date: 08/26/2020 CLINICAL DATA:  Aortic Stenosis EXAM: Cardiac TAVR CT TECHNIQUE: The patient was scanned on a Siemens Force 948 slice scanner. A 120 kV retrospective scan was triggered in the ascending thoracic aorta at 140 HU's. Gantry  rotation speed was 250 msecs and collimation was .6 mm. No beta blockade or nitro were given. The 3D data set was reconstructed in 5% intervals of the R-R cycle. Systolic and diastolic phases were analyzed on a dedicated work station using MPR, MIP and VRT modes. The patient received 80 cc of contrast. FINDINGS: Aortic Valve: Tri leaflet calcified restricted motion Calcium score 2089 Aorta: No aneurysm moderate calcific atherosclerosis Sino-tubular Junction: 23 mm Ascending Thoracic Aorta: 30 mm Descending Thoracic Aorta: 22 mm Sinus of Valsalva Measurements: Non-coronary: 26.3 mm Right - coronary: 27.2 mm Left -   coronary: 28 mm Coronary Artery Height above Annulus: Left Main: 12.7 mm above annulus Right Coronary: 13.5 mm above annulus Virtual Basal Annulus Measurements: Maximum / Minimum Diameter: 24 mm x 20.3 mm Perimeter: 73 mm Area: 400 mm2 Coronary Arteries: Sufficient height above annulus for deployment  Optimum Fluoroscopic Angle for Delivery: LAO 5 Caudal 18 degrees IMPRESSION: 1.  Calcified tri leaflet aortic valve with score 2089 2.  Normal aortic root diameter 3.0 cm moderate atherosclerosis 3.  Aortic annulus 400 mm2 suitable for a 23 mm Sapien 3 valve 4. Optimum angiographic angle for deployment LAO 5 Caudal 18 degrees 5.  Coronary arteries sufficient height above annulus for deployment [name] Electronically Signed: By: [name] M.D. On: 08/25/2020 12:45   DG Chest Port 1 View  Result Date: 08/21/2020 CLINICAL DATA:  Shortness of breath EXAM: PORTABLE CHEST 1 VIEW COMPARISON:  November 01, 2014 FINDINGS: There is no edema or airspace opacity. Heart is mildly enlarged with pulmonary vascularity normal. No adenopathy. There is degenerative change in the thoracic spine. Clips noted in right breast region. IMPRESSION: No edema or airspace opacity.  Heart mildly enlarged. Electronically Signed   By: Lowella Grip III M.D.   On: 08/21/2020 08:08   CT ANGIO CHEST AORTA W/CM & OR WO/CM  Result Date: 08/26/2020 CLINICAL DATA:  Inpatient.  Aortic stenosis.  TAVR planning. EXAM: CT ANGIOGRAPHY CHEST, ABDOMEN AND PELVIS TECHNIQUE: Multidetector CT imaging through the chest, abdomen and pelvis was performed using the standard protocol during bolus administration of intravenous contrast. Multiplanar reconstructed images and MIPs were obtained and reviewed to evaluate the vascular anatomy. CONTRAST:  120mL OMNIPAQUE IOHEXOL 350 MG/ML SOLN COMPARISON:  08/21/2020 chest CT angiogram. FINDINGS: CTA CHEST FINDINGS Cardiovascular: Borderline mild cardiomegaly. Trace pericardial effusion/thickening. Diffusely thickening coarsely calcified aortic valve. Three-vessel coronary atherosclerosis. Atherosclerotic nonaneurysmal thoracic aorta. Top-normal caliber main pulmonary artery (3.0 cm diameter). No central pulmonary emboli. Mediastinum/Nodes: No discrete thyroid nodules. Unremarkable esophagus. No pathologically  enlarged axillary, mediastinal or hilar lymph nodes. Lungs/Pleura: No pneumothorax. Small dependent bilateral pleural effusions. Mild centrilobular emphysema with diffuse bronchial wall thickening. Irregular masslike focus of consolidation in the posterior right lower lobe measures 4.1 x 2.2 cm (series 6/image 52), not appreciably changed from 08/21/2020. Irregular nodular focus of consolidation in the posterior right upper lobe measures 2.1 x 1.5 cm (series 6/image 36), stable in size and slightly more organized since 08/21/2020. No additional significant pulmonary nodules. Musculoskeletal: No aggressive appearing focal osseous lesions. Moderate thoracic spondylosis. CTA ABDOMEN AND PELVIS FINDINGS Hepatobiliary: Normal liver with no liver mass. Small amount of layering hyperdense material within the distended gallbladder, cannot exclude small gallstones. No gallbladder wall thickening or pericholecystic fluid. No biliary ductal dilatation. Pancreas: Normal, with no mass or duct dilation. Spleen: Normal size. No mass. Adrenals/Urinary Tract: Normal adrenals. No hydronephrosis. Simple 2.4 cm anterior upper left renal cyst. No suspicious contour deforming renal lesions. Normal bladder.  Stomach/Bowel: Small hiatal hernia. Otherwise normal nondistended stomach. Normal caliber small bowel with no small bowel wall thickening. Normal appendix. Mild left colonic diverticulosis with no large bowel wall thickening or significant pericolonic fat stranding. Vascular/Lymphatic: Atherosclerotic nonaneurysmal abdominal aorta. No pathologically enlarged lymph nodes in the abdomen or pelvis. Reproductive: Status post hysterectomy, with no abnormal findings at the vaginal cuff. No adnexal mass. Other: No pneumoperitoneum, ascites or focal fluid collection. Musculoskeletal: No aggressive appearing focal osseous lesions. Moderate lumbar spondylosis. VASCULAR MEASUREMENTS PERTINENT TO TAVR: AORTA: Minimal Aortic Diameter-10.3 x 10.0 mm  Severity of Aortic Calcification-moderate RIGHT PELVIS: Right Common Iliac Artery - Minimal Diameter-7.3 x 5.3 mm Tortuosity-mild Calcification-severe Right External Iliac Artery - Minimal Diameter-5.2 x 5.0 mm Tortuosity-mild Calcification-mild Right Common Femoral Artery - Minimal Diameter-5.3 x 4.5 mm Tortuosity-mild Calcification-moderate LEFT PELVIS: Left Common Iliac Artery - Minimal Diameter-7.2 x 5.6 mm Tortuosity-mild Calcification-moderate Left External Iliac Artery - Minimal Diameter-5.2 x 4.9 mm Tortuosity-mild Calcification-mild Left Common Femoral Artery - Minimal Diameter-5.1 x 4.4 mm Tortuosity-mild Calcification-moderate Review of the MIP images confirms the above findings. IMPRESSION: 1. Vascular findings and measurements pertinent to potential TAVR procedure, as detailed. 2. Diffuse thickening and coarse calcification of the aortic valve, compatible with the reported history of aortic stenosis. 3. Borderline mild cardiomegaly. Trace pericardial effusion/thickening. Three-vessel coronary atherosclerosis. 4. Small dependent bilateral pleural effusions. 5. Irregular nodular and masslike focus of consolidation in the posterior right lower and posterior right upper lung lobes, stable in size and slightly more organized since 08/21/2020 chest CT, indeterminate for pneumonia versus neoplasm. Short-term follow-up chest CT recommended in 2-3 months, preferably after a trial of antibiotic therapy. 6. Mild centrilobular emphysema with diffuse bronchial wall thickening, suggesting COPD. 7. Small hiatal hernia. 8. Mild left colonic diverticulosis. 9. Small amount of layering hyperdense material within the distended gallbladder, cannot exclude small gallstones. 10. Aortic Atherosclerosis (ICD10-I70.0) and Emphysema (ICD10-J43.9). Electronically Signed   By: Ilona Sorrel M.D.   On: 08/26/2020 09:25   ECHOCARDIOGRAM COMPLETE  Result Date: 09/01/2020    ECHOCARDIOGRAM REPORT   Patient Name:   Jurni Cesaro  Date of Exam: 09/01/2020 Medical Rec #:  [id number]       Height:       59.0 in Accession #:    [id number]      Weight:       140.2 lb Date of Birth:  July 03, 1934        BSA:          1.586 m Patient Age:    5 years        BP:           101/55 mmHg Patient Gender: F               HR:           63 bpm. Exam Location:  Inpatient Procedure: 2D Echo, Cardiac Doppler and Color Doppler Indications:    S/P TAVR  History:        Patient has prior history of Echocardiogram examinations, most                 recent 08/31/2020. Signs/Symptoms:Murmur; Risk                 Factors:Dyslipidemia and Hypertension. 08/31/20 TAVR                 08/23/20 cath.  Sonographer:    Girard Referring Phys: [id number] [place]  1. The aortic valve has  been repaired/replaced. Well seated 23 mm Sapien 3 Valve with DVI 0.35, no paravalvular leak, and with appropriate gradients and acceleration time. Aortic valve regurgitation is not visualized. No aortic stenosis is present. Aortic valve mean gradient measures 5.7 mmHg. Aortic valve Vmax measures 1.62 m/s. Aortic valve acceleration time measures 80 msec.  2. Left ventricular ejection fraction, by estimation, is 20 to 25%. Left ventricular ejection fraction by 2D MOD biplane is 21.5 %. The left ventricle has severely decreased function. The left ventricle demonstrates global hypokinesis. Left ventricular diastolic parameters are consistent with Grade II diastolic dysfunction (pseudonormalization).  3. Right ventricular systolic function is mildly reduced. The right ventricular size is normal. Tricuspid regurgitation signal is inadequate for assessing PA pressure.  4. Left atrial size was moderately dilated.  5. The mitral valve is abnormal. Trivial mitral valve regurgitation. No evidence of mitral stenosis.  6. The inferior vena cava is normal in size with greater than 50% respiratory variability, suggesting right atrial pressure of 3 mmHg. Comparison(s): A prior study was  performed on 08/31/20. Prior images reviewed side by side. Stable TAVR positioning. FINDINGS  Left Ventricle: Left ventricular ejection fraction, by estimation, is 20 to 25%. Left ventricular ejection fraction by 2D MOD biplane is 21.5 %. The left ventricle has severely decreased function. The left ventricle demonstrates global hypokinesis. The left ventricular internal cavity size was normal in size. There is no left ventricular hypertrophy. Left ventricular diastolic parameters are consistent with Grade II diastolic dysfunction (pseudonormalization). Right Ventricle: The right ventricular size is normal. Right vetricular wall thickness was not well visualized. Right ventricular systolic function is mildly reduced. Tricuspid regurgitation signal is inadequate for assessing PA pressure. Left Atrium: Left atrial size was moderately dilated. Right Atrium: Right atrial size was normal in size. Pericardium: There is no evidence of pericardial effusion. Mitral Valve: The mitral valve is abnormal. Mild to moderate mitral annular calcification. Trivial mitral valve regurgitation. No evidence of mitral valve stenosis. MV peak gradient, 5.6 mmHg. The mean mitral valve gradient is 2.0 mmHg. Tricuspid Valve: The tricuspid valve is grossly normal. Tricuspid valve regurgitation is trivial. Aortic Valve: The aortic valve has been repaired/replaced. Aortic valve regurgitation is not visualized. No aortic stenosis is present. Aortic valve mean gradient measures 5.7 mmHg. Aortic valve peak gradient measures 10.5 mmHg. Aortic valve area, by VTI measures 0.90  cm. There is a Sapien prosthetic, stented (TAVR) valve present in the aortic position. Pulmonic Valve: The pulmonic valve was grossly normal. Pulmonic valve regurgitation is not visualized. No evidence of pulmonic stenosis. Aorta: The aortic root and ascending aorta are structurally normal, with no evidence of dilitation. Venous: The inferior vena cava is normal in size with  greater than 50% respiratory variability, suggesting right atrial pressure of 3 mmHg. IAS/Shunts: The atrial septum is grossly normal.  LEFT VENTRICLE PLAX 2D                        Biplane EF (MOD) LVIDd:         5.20 cm         LV Biplane EF:   Left LVIDs:         4.00 cm                          ventricular LV PW:         0.70 cm  ejection LV IVS:        1.00 cm                          fraction by LVOT diam:     1.80 cm                          2D MOD LV SV:         30                               biplane is LV SV Index:   19                               21.5 %. LVOT Area:     2.54 cm                                Diastology                                LV e' medial:    4.05 cm/s LV Volumes (MOD)               LV E/e' medial:  23.5 LV vol d, MOD    80.6 ml       LV e' lateral:   6.64 cm/s A2C:                           LV E/e' lateral: 14.3 LV vol d, MOD    70.6 ml A4C: LV vol s, MOD    63.7 ml A2C: LV vol s, MOD    57.5 ml A4C: LV SV MOD A2C:   16.9 ml LV SV MOD A4C:   70.6 ml LV SV MOD BP:    16.9 ml RIGHT VENTRICLE RV Basal diam:  4.00 cm RV Mid diam:    2.30 cm RV S prime:     7.65 cm/s TAPSE (M-mode): 1.3 cm LEFT ATRIUM             Index       RIGHT ATRIUM           Index LA diam:        3.70 cm 2.33 cm/m  RA Area:     10.30 cm LA Vol (A2C):   72.0 ml 45.40 ml/m RA Volume:   20.70 ml  13.05 ml/m LA Vol (A4C):   57.5 ml 36.26 ml/m LA Biplane Vol: 65.8 ml 41.49 ml/m  AORTIC VALVE                    PULMONIC VALVE AV Area (Vmax):    0.88 cm     PV Vmax:       0.66 m/s AV Area (Vmean):   0.84 cm     PV Vmean:      44.000 cm/s AV Area (VTI):     0.90 cm     PV VTI:        0.150 m AV Vmax:           162.33 cm/s  PV Peak grad:  1.7 mmHg AV Vmean:  106.333 cm/s PV Mean grad:  1.0 mmHg AV VTI:            0.335 m AV Peak Grad:      10.5 mmHg AV Mean Grad:      5.7 mmHg LVOT Vmax:         56.30 cm/s LVOT Vmean:        35.000 cm/s LVOT VTI:          0.119 m LVOT/AV VTI  ratio: 0.35  AORTA Ao Asc diam: 2.90 cm MITRAL VALVE MV Area (PHT): 4.21 cm    SHUNTS MV Area VTI:   0.72 cm    Systemic VTI:  0.12 m MV Peak grad:  5.6 mmHg    Systemic Diam: 1.80 cm MV Mean grad:  2.0 mmHg MV Vmax:       1.18 m/s MV Vmean:      62.5 cm/s MV Decel Time: 180 msec MV E velocity: 95.10 cm/s MV A velocity: 66.00 cm/s MV E/A ratio:  1.44 Rudean Haskell MD Electronically signed by Rudean Haskell MD Signature Date/Time: 09/01/2020/12:58:07 PM    Final    ECHOCARDIOGRAM COMPLETE  Result Date: 08/22/2020    ECHOCARDIOGRAM REPORT   Patient Name:   ALZADA BRAZEE Date of Exam: 08/22/2020 Medical Rec #:  [id number]         Height:       59.0 in Accession #:    [id number]        Weight:       134.5 lb Date of Birth:  30-Jan-1935          BSA:          1.558 m Patient Age:    77 years          BP:           98/71 mmHg Patient Gender: F                 HR:           93 bpm. Exam Location:  Inpatient Procedure: 2D Echo Indications:    Heart failure  History:        Patient has no prior history of Echocardiogram examinations.                 CHF.  Sonographer:    Roseanna Rainbow Referring Phys: [id number] [place]  1. Left ventricular ejection fraction, by estimation, is 20 to 25%. The left ventricle has severely decreased function. The left ventricle demonstrates global hypokinesis. There is mild left ventricular hypertrophy. Left ventricular diastolic parameters  are consistent with Grade II diastolic dysfunction (pseudonormalization). Elevated left atrial pressure.  2. Right ventricular systolic function is mildly reduced. The right ventricular size is normal. Tricuspid regurgitation signal is inadequate for assessing PA pressure.  3. The mitral valve is normal in structure. Trivial mitral valve regurgitation.  4. The aortic valve is calcified. There is severe calcifcation of the aortic valve. Aortic valve regurgitation is trivial. Severe aortic valve stenosis. Vmax 4.3 m/s, MG 49 mmHg, AVA  0.7 cm^2, DI 0.26  5. The inferior vena cava is normal in size with <50% respiratory variability, suggesting right atrial pressure of 8 mmHg. FINDINGS  Left Ventricle: Left ventricular ejection fraction, by estimation, is 20 to 25%. The left ventricle has severely decreased function. The left ventricle demonstrates global hypokinesis. The left ventricular internal cavity size was normal in size. There is mild left ventricular hypertrophy. Left ventricular diastolic parameters  are consistent with Grade II diastolic dysfunction (pseudonormalization). Elevated left atrial pressure. Right Ventricle: The right ventricular size is normal. Right vetricular wall thickness was not well visualized. Right ventricular systolic function is mildly reduced. Tricuspid regurgitation signal is inadequate for assessing PA pressure. Left Atrium: Left atrial size was normal in size. Right Atrium: Right atrial size was normal in size. Pericardium: There is no evidence of pericardial effusion. Mitral Valve: The mitral valve is normal in structure. Trivial mitral valve regurgitation. Tricuspid Valve: The tricuspid valve is normal in structure. Tricuspid valve regurgitation is trivial. Aortic Valve: The aortic valve is calcified. There is severe calcifcation of the aortic valve. Aortic valve regurgitation is trivial. Severe aortic stenosis is present. Aortic valve mean gradient measures 40.2 mmHg. Aortic valve peak gradient measures 66.2 mmHg. Aortic valve area, by VTI measures 0.82 cm. Pulmonic Valve: The pulmonic valve was not well visualized. Pulmonic valve regurgitation is not visualized. Aorta: The aortic root and ascending aorta are structurally normal, with no evidence of dilitation. Venous: The inferior vena cava is normal in size with less than 50% respiratory variability, suggesting right atrial pressure of 8 mmHg. IAS/Shunts: The interatrial septum was not well visualized.  LEFT VENTRICLE PLAX 2D LVIDd:         4.40 cm      Diastology LVIDs:         3.80 cm     LV e' medial:    3.26 cm/s LV PW:         1.30 cm     LV E/e' medial:  31.0 LV IVS:        1.20 cm     LV e' lateral:   4.68 cm/s LVOT diam:     1.90 cm     LV E/e' lateral: 21.6 LV SV:         77 LV SV Index:   49 LVOT Area:     2.84 cm  LV Volumes (MOD) LV vol d, MOD A2C: 79.7 ml LV vol d, MOD A4C: 44.9 ml LV vol s, MOD A2C: 78.1 ml LV vol s, MOD A4C: 55.6 ml LV SV MOD A2C:     1.6 ml LV SV MOD A4C:     44.9 ml LV SV MOD BP:      15.1 ml RIGHT VENTRICLE            IVC RV S prime:     6.31 cm/s  IVC diam: 1.80 cm TAPSE (M-mode): 1.3 cm LEFT ATRIUM             Index       RIGHT ATRIUM          Index LA diam:        3.40 cm 2.18 cm/m  RA Area:     8.95 cm LA Vol (A2C):   29.0 ml 18.61 ml/m RA Volume:   17.40 ml 11.17 ml/m LA Vol (A4C):   40.9 ml 26.25 ml/m LA Biplane Vol: 37.4 ml 24.00 ml/m  AORTIC VALVE                    PULMONIC VALVE AV Area (Vmax):    0.91 cm     PR End Diast Vel: 2.66 msec AV Area (Vmean):   0.84 cm AV Area (VTI):     0.82 cm AV Vmax:           406.80 cm/s AV Vmean:          298.600  cm/s AV VTI:            0.937 m AV Peak Grad:      66.2 mmHg AV Mean Grad:      40.2 mmHg LVOT Vmax:         131.00 cm/s LVOT Vmean:        88.700 cm/s LVOT VTI:          0.271 m LVOT/AV VTI ratio: 0.29  AORTA Ao Root diam: 2.90 cm Ao Asc diam:  3.30 cm MITRAL VALVE MV Area (PHT): 9.85 cm     SHUNTS MV Decel Time: 77 msec      Systemic VTI:  0.27 m MV E velocity: 101.00 cm/s  Systemic Diam: 1.90 cm MV A velocity: 61.30 cm/s MV E/A ratio:  1.65 Oswaldo Milian MD Electronically signed by Oswaldo Milian MD Signature Date/Time: 08/22/2020/5:07:56 PM    Final    VAS US CAROTID  Result Date: 08/23/2020 Carotid Arterial Duplex Study Patient Name:  Candie Echevaria  Date of Exam:   08/23/2020 Medical Rec #: [id number]          Accession #:    [id number] Date of Birth: 05-04-1934           Patient Gender: F Patient Age:   89W Exam Location:  [facility]  Procedure:      VAS US CAROTID Referring Phys: [id number] [place] --------------------------------------------------------------------------------  Indications:       Severe aortic stenosis. Risk Factors:      Hypertension, hyperlipidemia, past history of smoking. Comparison Study:  No prior studies. Performing Technologist: Darlin Coco RDMS,RVT  Examination Guidelines: A complete evaluation includes B-mode imaging, spectral Doppler, color Doppler, and power Doppler as needed of all accessible portions of each vessel. Bilateral testing is considered an integral part of a complete examination. Limited examinations for reoccurring indications may be performed as noted.  Right Carotid Findings: +----------+--------+--------+--------+------------------+------------------+           PSV cm/sEDV cm/sStenosisPlaque DescriptionComments           +----------+--------+--------+--------+------------------+------------------+ CCA Prox  44      14                                                   +----------+--------+--------+--------+------------------+------------------+ CCA Distal48      20                                intimal thickening +----------+--------+--------+--------+------------------+------------------+ ICA Prox  33      15      1-39%   calcific                             +----------+--------+--------+--------+------------------+------------------+ ICA Distal58      24                                                   +----------+--------+--------+--------+------------------+------------------+ ECA       63      20                                                   +----------+--------+--------+--------+------------------+------------------+ +----------+--------+-------+----------------+-------------------+  PSV cm/sEDV cmsDescribe        Arm Pressure (mmHG) +----------+--------+-------+----------------+-------------------+ [id number]              Multiphasic, WNL                    +----------+--------+-------+----------------+-------------------+ +---------+--------+--+--------+--+---------+ VertebralPSV cm/s24EDV cm/s10Antegrade +---------+--------+--+--------+--+---------+  Left Carotid Findings: +----------+-------+--------+--------+-----------------------+-----------------+           PSV    EDV cm/sStenosisPlaque Description     Comments                    cm/s                                                            +----------+-------+--------+--------+-----------------------+-----------------+ CCA Prox  51     14                                                       +----------+-------+--------+--------+-----------------------+-----------------+ CCA Distal41     15                                     intimal                                                                   thickening        +----------+-------+--------+--------+-----------------------+-----------------+ ICA Prox  36     15      1-39%   calcific and                                                              heterogenous                             +----------+-------+--------+--------+-----------------------+-----------------+ ICA Distal73     26                                                       +----------+-------+--------+--------+-----------------------+-----------------+ ECA       47                                                              +----------+-------+--------+--------+-----------------------+-----------------+ +----------+--------+--------+----------------+-------------------+           PSV cm/sEDV  cm/sDescribe        Arm Pressure (mmHG) +----------+--------+--------+----------------+-------------------+ [id number]              Multiphasic, WNL                    +----------+--------+--------+----------------+-------------------+  +---------+--------+--+--------+--+---------+ VertebralPSV cm/s42EDV cm/s11Antegrade +---------+--------+--+--------+--+---------+   Summary: Right Carotid: Velocities in the right ICA are consistent with a 1-39% stenosis. Left Carotid: Velocities in the left ICA are consistent with a 1-39% stenosis. Vertebrals:  Bilateral vertebral arteries demonstrate antegrade flow. Subclavians: Normal flow hemodynamics were seen in bilateral subclavian              arteries. *See table(s) above for measurements and observations.  Electronically signed by Jamelle Haring on 08/23/2020 at 2:54:55 PM.    Final    VAS [place] LOWER EXTREMITY VENOUS (DVT)  Result Date: 08/22/2020  Lower Venous DVT Study Patient Name:  ANJELITA SHEAHAN  Date of Exam:   08/22/2020 Medical Rec #: [id number]          Accession #:    [id number] Date of Birth: [birth year]           Patient Gender: F Patient Age:   86Y Exam Location:  [facility] Procedure:      VAS [place] LOWER EXTREMITY VENOUS (DVT) Referring Phys: [id number] [place] --------------------------------------------------------------------------------  Indications: Elevated Ddimer.  Risk Factors: None identified. Comparison Study: No prior studies. Performing Technologist: Oliver Hum RVT  Examination Guidelines: A complete evaluation includes B-mode imaging, spectral Doppler, color Doppler, and power Doppler as needed of all accessible portions of each vessel. Bilateral testing is considered an integral part of a complete examination. Limited examinations for reoccurring indications may be performed as noted. The reflux portion of the exam is performed with the patient in reverse Trendelenburg.  +---------+---------------+---------+-----------+----------+--------------+ RIGHT    CompressibilityPhasicitySpontaneityPropertiesThrombus Aging +---------+---------------+---------+-----------+----------+--------------+ CFV      Full           Yes      Yes                                  +---------+---------------+---------+-----------+----------+--------------+ SFJ      Full                                                        +---------+---------------+---------+-----------+----------+--------------+ FV Prox  Full                                                        +---------+---------------+---------+-----------+----------+--------------+ FV Mid   Full                                                        +---------+---------------+---------+-----------+----------+--------------+ FV DistalFull                                                        +---------+---------------+---------+-----------+----------+--------------+  PFV      Full                                                        +---------+---------------+---------+-----------+----------+--------------+ POP      Full           Yes      Yes                                 +---------+---------------+---------+-----------+----------+--------------+ PTV      Full                                                        +---------+---------------+---------+-----------+----------+--------------+ PERO     Full                                                        +---------+---------------+---------+-----------+----------+--------------+   +---------+---------------+---------+-----------+----------+--------------+ LEFT     CompressibilityPhasicitySpontaneityPropertiesThrombus Aging +---------+---------------+---------+-----------+----------+--------------+ CFV      Full           Yes      Yes                                 +---------+---------------+---------+-----------+----------+--------------+ SFJ      Full                                                        +---------+---------------+---------+-----------+----------+--------------+ FV Prox  Full                                                         +---------+---------------+---------+-----------+----------+--------------+ FV Mid   Full                                                        +---------+---------------+---------+-----------+----------+--------------+ FV DistalFull                                                        +---------+---------------+---------+-----------+----------+--------------+ PFV      Full                                                        +---------+---------------+---------+-----------+----------+--------------+  POP      Full           Yes      Yes                                 +---------+---------------+---------+-----------+----------+--------------+ PTV      Full                                                        +---------+---------------+---------+-----------+----------+--------------+ PERO     Full                                                        +---------+---------------+---------+-----------+----------+--------------+     Summary: RIGHT: - There is no evidence of deep vein thrombosis in the lower extremity.  - No cystic structure found in the popliteal fossa.  LEFT: - There is no evidence of deep vein thrombosis in the lower extremity.  - No cystic structure found in the popliteal fossa.  *See table(s) above for measurements and observations. Electronically signed by Ruta Hinds MD on 08/22/2020 at 5:10:02 PM.    Final    ECHOCARDIOGRAM LIMITED  Result Date: 08/31/2020    ECHOCARDIOGRAM LIMITED REPORT   Patient Name:   EDELL MESENBRINK Date of Exam: 08/31/2020 Medical Rec #:  [id number]         Height:       59.0 in Accession #:    [id number]        Weight:       127.6 lb Date of Birth:  [birth year]          BSA:          1.524 m Patient Age:    33 years          BP:           97/71 mmHg Patient Gender: F                 HR:           86 bpm. Exam Location:  Inpatient Procedure: Limited Echo, Cardiac Doppler and Color Doppler Indications:     Aortic stenosis   History:         Patient has prior history of Echocardiogram examinations, most                  recent 08/22/2020. CHF, Aortic Valve Disease; Risk                  Factors:Hypertension, Dyslipidemia and Current Smoker.  Sonographer:     Dustin Flock Referring Phys:  [id number] [place] Ganja THOMPSON Diagnosing Phys: [name] MD IMPRESSIONS  1. Note patient had brief period of EMD when valve was crossed. Responded to epi and short course of chest compression.  2. Left ventricular ejection fraction, by estimation, is 20 to 25%. The left ventricle has severely decreased function. The left ventricle demonstrates global hypokinesis. The left ventricular internal cavity size was severely dilated.  3. Left atrial size was moderately dilated.  4. The mitral valve is abnormal. Mild mitral valve regurgitation.  5.  Pre TAVR: severely calcified tri leaflet AV with limited motion of the right coronary custp severe low gradient AS mean gradient 31 mmHg peak 51 mmHg AVA 0.26 cm2         Post TAVR: Well positioned 23 mm Sapien 3 valve No PVL mean gradient 2 peak 5 mmhg AVA 2.1 cm2 . The aortic valve is tricuspid. There is severe calcifcation of the aortic valve. Aortic valve regurgitation is mild. Severe aortic valve stenosis. Procedure Date: 08/31/2020. FINDINGS  Left Ventricle: Left ventricular ejection fraction, by estimation, is 20 to 25%. The left ventricle has severely decreased function. The left ventricle demonstrates global hypokinesis. The left ventricular internal cavity size was severely dilated. There is no left ventricular hypertrophy. Left Atrium: Left atrial size was moderately dilated. Pericardium: There is no evidence of pericardial effusion. Mitral Valve: The mitral valve is abnormal. There is moderate thickening of the mitral valve leaflet(s). There is moderate calcification of the mitral valve leaflet(s). Mild mitral annular calcification. Mild mitral valve regurgitation. Aortic Valve: Pre TAVR: severely  calcified tri leaflet AV with limited motion of the right coronary custp severe low gradient AS mean gradient 31 mmHg peak 51 mmHg AVA 0.26 cm2 Post TAVR: Well positioned 23 mm Sapien 3 valve No PVL mean gradient 2 peak 5 mmhg AVA 2.1 cm2. The aortic valve is tricuspid. There is severe calcifcation of the aortic valve. Aortic valve regurgitation is mild. Severe aortic stenosis is present. Aortic  valve mean gradient measures 2.5 mmHg. Aortic valve peak gradient measures 5.7 mmHg. Aortic valve area, by VTI measures 2.21 cm. There is a 23 mm Sapien prosthetic, stented (TAVR) valve present in the aortic position. Aorta: The aortic root is normal in size and structure. Additional Comments: Note patient had brief period of EMD when valve was crossed. Responded to epi and short course of chest compression. LEFT VENTRICLE PLAX 2D LVOT diam:     2.00 cm LV SV:         33 LV SV Index:   22 LVOT Area:     3.14 cm  AORTIC VALVE AV Area (Vmax):    2.20 cm AV Area (Vmean):   2.31 cm AV Area (VTI):     2.21 cm AV Vmax:           119.00 cm/s AV Vmean:          75.300 cm/s AV VTI:            0.151 m AV Peak Grad:      5.7 mmHg AV Mean Grad:      2.5 mmHg LVOT Vmax:         83.50 cm/s LVOT Vmean:        55.300 cm/s LVOT VTI:          0.106 m LVOT/AV VTI ratio: 0.70  SHUNTS Systemic VTI:  0.11 m Systemic Diam: 2.00 cm [name] MD Electronically signed by [name] MD Signature Date/Time: 08/31/2020/4:44:23 PM    Final    Structural Heart Procedure  Result Date: 08/31/2020 See surgical note for result.  CT Angio Abd/Pel w/ and/or w/o  Result Date: 08/26/2020 CLINICAL DATA:  Inpatient.  Aortic stenosis.  TAVR planning. EXAM: CT ANGIOGRAPHY CHEST, ABDOMEN AND PELVIS TECHNIQUE: Multidetector CT imaging through the chest, abdomen and pelvis was performed using the standard protocol during bolus administration of intravenous contrast. Multiplanar reconstructed images and MIPs were obtained and reviewed to evaluate the  vascular anatomy. CONTRAST:  139mL OMNIPAQUE IOHEXOL 350 MG/ML  SOLN COMPARISON:  08/21/2020 chest CT angiogram. FINDINGS: CTA CHEST FINDINGS Cardiovascular: Borderline mild cardiomegaly. Trace pericardial effusion/thickening. Diffusely thickening coarsely calcified aortic valve. Three-vessel coronary atherosclerosis. Atherosclerotic nonaneurysmal thoracic aorta. Top-normal caliber main pulmonary artery (3.0 cm diameter). No central pulmonary emboli. Mediastinum/Nodes: No discrete thyroid nodules. Unremarkable esophagus. No pathologically enlarged axillary, mediastinal or hilar lymph nodes. Lungs/Pleura: No pneumothorax. Small dependent bilateral pleural effusions. Mild centrilobular emphysema with diffuse bronchial wall thickening. Irregular masslike focus of consolidation in the posterior right lower lobe measures 4.1 x 2.2 cm (series 6/image 52), not appreciably changed from 08/21/2020. Irregular nodular focus of consolidation in the posterior right upper lobe measures 2.1 x 1.5 cm (series 6/image 36), stable in size and slightly more organized since 08/21/2020. No additional significant pulmonary nodules. Musculoskeletal: No aggressive appearing focal osseous lesions. Moderate thoracic spondylosis. CTA ABDOMEN AND PELVIS FINDINGS Hepatobiliary: Normal liver with no liver mass. Small amount of layering hyperdense material within the distended gallbladder, cannot exclude small gallstones. No gallbladder wall thickening or pericholecystic fluid. No biliary ductal dilatation. Pancreas: Normal, with no mass or duct dilation. Spleen: Normal size. No mass. Adrenals/Urinary Tract: Normal adrenals. No hydronephrosis. Simple 2.4 cm anterior upper left renal cyst. No suspicious contour deforming renal lesions. Normal bladder. Stomach/Bowel: Small hiatal hernia. Otherwise normal nondistended stomach. Normal caliber small bowel with no small bowel wall thickening. Normal appendix. Mild left colonic diverticulosis with no  large bowel wall thickening or significant pericolonic fat stranding. Vascular/Lymphatic: Atherosclerotic nonaneurysmal abdominal aorta. No pathologically enlarged lymph nodes in the abdomen or pelvis. Reproductive: Status post hysterectomy, with no abnormal findings at the vaginal cuff. No adnexal mass. Other: No pneumoperitoneum, ascites or focal fluid collection. Musculoskeletal: No aggressive appearing focal osseous lesions. Moderate lumbar spondylosis. VASCULAR MEASUREMENTS PERTINENT TO TAVR: AORTA: Minimal Aortic Diameter-10.3 x 10.0 mm Severity of Aortic Calcification-moderate RIGHT PELVIS: Right Common Iliac Artery - Minimal Diameter-7.3 x 5.3 mm Tortuosity-mild Calcification-severe Right External Iliac Artery - Minimal Diameter-5.2 x 5.0 mm Tortuosity-mild Calcification-mild Right Common Femoral Artery - Minimal Diameter-5.3 x 4.5 mm Tortuosity-mild Calcification-moderate LEFT PELVIS: Left Common Iliac Artery - Minimal Diameter-7.2 x 5.6 mm Tortuosity-mild Calcification-moderate Left External Iliac Artery - Minimal Diameter-5.2 x 4.9 mm Tortuosity-mild Calcification-mild Left Common Femoral Artery - Minimal Diameter-5.1 x 4.4 mm Tortuosity-mild Calcification-moderate Review of the MIP images confirms the above findings. IMPRESSION: 1. Vascular findings and measurements pertinent to potential TAVR procedure, as detailed. 2. Diffuse thickening and coarse calcification of the aortic valve, compatible with the reported history of aortic stenosis. 3. Borderline mild cardiomegaly. Trace pericardial effusion/thickening. Three-vessel coronary atherosclerosis. 4. Small dependent bilateral pleural effusions. 5. Irregular nodular and masslike focus of consolidation in the posterior right lower and posterior right upper lung lobes, stable in size and slightly more organized since 08/21/2020 chest CT, indeterminate for pneumonia versus neoplasm. Short-term follow-up chest CT recommended in 2-3 months, preferably after a  trial of antibiotic therapy. 6. Mild centrilobular emphysema with diffuse bronchial wall thickening, suggesting COPD. 7. Small hiatal hernia. 8. Mild left colonic diverticulosis. 9. Small amount of layering hyperdense material within the distended gallbladder, cannot exclude small gallstones. 10. Aortic Atherosclerosis (ICD10-I70.0) and Emphysema (ICD10-J43.9). Electronically Signed   By: Ilona Sorrel M.D.   On: 08/26/2020 09:25     Subjective: No complaints feels well.  Discharge Exam: Vitals:   09/03/20 0416 09/03/20 1008  BP: (!) 99/56 93/66  Pulse: 78 76  Resp: 16 20  Temp: 98.1 F (36.7 C) 98.2 F (36.8 C)  SpO2: 99% 97%  Vitals:   09/02/20 1939 09/02/20 2339 09/03/20 0416 09/03/20 1008  BP: (!) 91/56 (!) 105/51 (!) 99/56 93/66  Pulse: 74 81 78 76  Resp: 20 17 16 20   Temp: 98.2 F (36.8 C) 98.4 F (36.9 C) 98.1 F (36.7 C) 98.2 F (36.8 C)  TempSrc: Oral Oral Oral Oral  SpO2: 96% 97% 99% 97%  Weight:   59.8 kg   Height:        General: Pt is alert, awake, not in acute distress Cardiovascular: RRR, S1/S2 +, no rubs, no gallops Respiratory: CTA bilaterally, no wheezing, no rhonchi Abdominal: Soft, NT, ND, bowel sounds + Extremities: no edema, no cyanosis    The results of significant diagnostics from this hospitalization (including imaging, microbiology, ancillary and laboratory) are listed below for reference.     Microbiology: Recent Results (from the past 240 hour(s))  Surgical pcr screen     Status: None   Collection Time: 08/30/20  4:22 PM   Specimen: Nasal Mucosa; Nasal Swab  Result Value Ref Range Status   MRSA, PCR NEGATIVE NEGATIVE Final   Staphylococcus aureus NEGATIVE NEGATIVE Final    Comment: (NOTE) The Xpert SA Assay (FDA approved for NASAL specimens in patients 87 years of age and older), is one component of a comprehensive surveillance program. It is not intended to diagnose infection nor to guide or monitor treatment. Performed at [facility] [facility], [place] [address], [place], [place] [address]      Labs: BNP (last 3 results) Recent Labs    08/21/20 0708  BNP 2,353.6*   Basic Metabolic Panel: Recent Labs  Lab 08/30/20 0901 08/31/20 0234 09/01/20 0217 09/02/20 0105 09/03/20 0108  NA 137 135 137 135 135  K 3.8 4.1 4.1 3.8 3.5  CL 109 106 108 104 105  CO2 17* 20* 20* 23 23  GLUCOSE 141* 101* 113* 92 83  BUN 18 19 14 14 11   CREATININE 1.12* 1.04* 0.83 0.98 0.82  CALCIUM 9.2 8.8* 8.4* 8.5* 8.4*  MG  --   --  2.2  --   --    Liver Function Tests: No results for input(s): AST, ALT, ALKPHOS, BILITOT, PROT, ALBUMIN in the last 168 hours. No results for input(s): LIPASE, AMYLASE in the last 168 hours. No results for input(s): AMMONIA in the last 168 hours. CBC: Recent Labs  Lab 08/31/20 0234 09/01/20 0217 09/02/20 0105 09/03/20 0108  WBC 5.3 6.9 7.7 6.2  HGB 12.4 11.5* 11.0* 10.7*  HCT 38.4 36.4 34.0* 32.9*  MCV 90.4 91.7 90.7 89.6  PLT 265 209 175 174   Cardiac Enzymes: No results for input(s): CKTOTAL, CKMB, CKMBINDEX, TROPONINI in the last 168 hours. BNP: Invalid input(s): POCBNP CBG: Recent Labs  Lab 09/01/20 1114 09/01/20 1611  GLUCAP 99 86   D-Dimer No results for input(s): DDIMER in the last 72 hours. Hgb A1c No results for input(s): HGBA1C in the last 72 hours. Lipid Profile No results for input(s): CHOL, HDL, LDLCALC, TRIG, CHOLHDL, LDLDIRECT in the last 72 hours. Thyroid function studies No results for input(s): TSH, T4TOTAL, T3FREE, THYROIDAB in the last 72 hours.  Invalid input(s): FREET3 Anemia work up No results for input(s): VITAMINB12, FOLATE, FERRITIN, TIBC, IRON, RETICCTPCT in the last 72 hours. Urinalysis    Component Value Date/Time   COLORURINE YELLOW 08/30/2020 1517   APPEARANCEUR HAZY (A) 08/30/2020 1517   LABSPEC 1.025 08/30/2020 1517   PHURINE 5.0 08/30/2020 1517   GLUCOSEU >=500 (A) 08/30/2020 1517   GLUCOSEU  NEGATIVE 12/07/2014 1017   HGBUR NEGATIVE  08/30/2020 1517   BILIRUBINUR NEGATIVE 08/30/2020 1517   KETONESUR NEGATIVE 08/30/2020 1517   PROTEINUR NEGATIVE 08/30/2020 1517   UROBILINOGEN 0.2 12/07/2014 1017   NITRITE NEGATIVE 08/30/2020 1517   LEUKOCYTESUR TRACE (A) 08/30/2020 1517   Sepsis Labs Invalid input(s): PROCALCITONIN,  WBC,  LACTICIDVEN Microbiology Recent Results (from the past 240 hour(s))  Surgical pcr screen     Status: None   Collection Time: 08/30/20  4:22 PM   Specimen: Nasal Mucosa; Nasal Swab  Result Value Ref Range Status   MRSA, PCR NEGATIVE NEGATIVE Final   Staphylococcus aureus NEGATIVE NEGATIVE Final    Comment: (NOTE) The Xpert SA Assay (FDA approved for NASAL specimens in patients 37 years of age and older), is one component of a comprehensive surveillance program. It is not intended to diagnose infection nor to guide or monitor treatment. Performed at [facility], [place] [address] [place], [place] [address]      Time coordinating discharge: Over 40 minutes  SIGNED:   Charlynne Cousins, MD  [facility] 09/03/2020, 10:41 AM Pager   If 7PM-7AM, please contact night-coverage [email or web address] Password [email or web address]

## 2020-09-03 NOTE — Progress Notes (Signed)
Progress Note  Patient Name: Alexandria Randolph Date of Encounter: 09/03/2020  [facility] Cardiologist: Donato Heinz, MD   Subjective   Feels great.  Does have some back discomfort and she wishes to get into the chair.  She did ambulate yesterday.  She feels ready to go home.  Family member at bedside.  Inpatient Medications    Scheduled Meds:  aspirin  81 mg Oral Daily   bisacodyl  5 mg Oral Once   cefdinir  300 mg Oral Q12H   Chlorhexidine Gluconate Cloth  6 each Topical Daily   clopidogrel  75 mg Oral Q breakfast   doxycycline  100 mg Oral Q12H   empagliflozin  10 mg Oral Daily   Continuous Infusions:  sodium chloride Stopped (09/01/20 0809)   sodium chloride     sodium chloride     lactated ringers Stopped (09/01/20 1036)   PRN Meds: sodium chloride, sodium chloride, acetaminophen **OR** acetaminophen, morphine injection, ondansetron (ZOFRAN) IV, oxyCODONE, polyethylene glycol   Vital Signs    Vitals:   09/02/20 1610 09/02/20 1939 09/02/20 2339 09/03/20 0416  BP: (!) 91/53 (!) 91/56 (!) 105/51 (!) 99/56  Pulse: 83 74 81 78  Resp: 17 20 17 16   Temp: 98.2 F (36.8 C) 98.2 F (36.8 C) 98.4 F (36.9 C) 98.1 F (36.7 C)  TempSrc: Oral Oral Oral Oral  SpO2:  96% 97% 99%  Weight:    59.8 kg  Height:        Intake/Output Summary (Last 24 hours) at 09/03/2020 0823 Last data filed at 09/02/2020 1939 Gross per 24 hour  Intake 278.98 ml  Output 400 ml  Net -121.02 ml   Last 3 Weights 09/03/2020 09/02/2020 09/01/2020  Weight (lbs) 131 lb 13.4 oz 129 lb 10.1 oz 129 lb 10.1 oz  Weight (kg) 59.8 kg 58.8 kg 58.8 kg      Telemetry    Sinus rhythm, no atrial fibrillation- Personally Reviewed  ECG    No new- Personally Reviewed  Physical Exam   GEN: No acute distress.  Thin, elderly Neck: No JVD Cardiac: RRR, no murmurs, rubs, or gallops.  Respiratory: Clear to auscultation bilaterally. GI: Soft, nontender, non-distended  MS: No edema; No  deformity. Neuro:  Nonfocal  Psych: Normal affect, smile on her face  Labs    High Sensitivity Troponin:   Recent Labs  Lab 08/21/20 1558 08/21/20 1834  TROPONINIHS 372* 389*      Chemistry Recent Labs  Lab 09/01/20 0217 09/02/20 0105 09/03/20 0108  NA 137 135 135  K 4.1 3.8 3.5  CL 108 104 105  CO2 20* 23 23  GLUCOSE 113* 92 83  BUN 14 14 11   CREATININE 0.83 0.98 0.82  CALCIUM 8.4* 8.5* 8.4*  GFRNONAA >60 57* >60  ANIONGAP 9 8 7      Hematology Recent Labs  Lab 09/01/20 0217 09/02/20 0105 09/03/20 0108  WBC 6.9 7.7 6.2  RBC 3.97 3.75* 3.67*  HGB 11.5* 11.0* 10.7*  HCT 36.4 34.0* 32.9*  MCV 91.7 90.7 89.6  MCH 29.0 29.3 29.2  MCHC 31.6 32.4 32.5  RDW 12.1 12.4 12.4  PLT 209 175 174    BNPNo results for input(s): BNP, PROBNP in the last 168 hours.   DDimer No results for input(s): DDIMER in the last 168 hours.   Radiology    ECHOCARDIOGRAM COMPLETE  Result Date: 09/01/2020    ECHOCARDIOGRAM REPORT   Patient Name:   Cathe Bilger Date of Exam: 09/01/2020  Medical Rec #:  [id number]       Height:       59.0 in Accession #:    [id number]      Weight:       140.2 lb Date of Birth:  1934/04/29        BSA:          1.586 m Patient Age:    86 years        BP:           101/55 mmHg Patient Gender: F               HR:           63 bpm. Exam Location:  Inpatient Procedure: 2D Echo, Cardiac Doppler and Color Doppler Indications:    S/P TAVR  History:        Patient has prior history of Echocardiogram examinations, most                 recent 08/31/2020. Signs/Symptoms:Murmur; Risk                 Factors:Dyslipidemia and Hypertension. 08/31/20 TAVR                 08/23/20 cath.  Sonographer:    [place] Referring Phys: [id number] [place]  1. The aortic valve has been repaired/replaced. Well seated 23 mm Sapien 3 Valve with DVI 0.35, no paravalvular leak, and with appropriate gradients and acceleration time. Aortic valve regurgitation is not  visualized. No aortic stenosis is present. Aortic valve mean gradient measures 5.7 mmHg. Aortic valve Vmax measures 1.62 m/s. Aortic valve acceleration time measures 80 msec.  2. Left ventricular ejection fraction, by estimation, is 20 to 25%. Left ventricular ejection fraction by 2D MOD biplane is 21.5 %. The left ventricle has severely decreased function. The left ventricle demonstrates global hypokinesis. Left ventricular diastolic parameters are consistent with Grade II diastolic dysfunction (pseudonormalization).  3. Right ventricular systolic function is mildly reduced. The right ventricular size is normal. Tricuspid regurgitation signal is inadequate for assessing PA pressure.  4. Left atrial size was moderately dilated.  5. The mitral valve is abnormal. Trivial mitral valve regurgitation. No evidence of mitral stenosis.  6. The inferior vena cava is normal in size with greater than 50% respiratory variability, suggesting right atrial pressure of 3 mmHg. Comparison(s): A prior study was performed on 08/31/20. Prior images reviewed side by side. Stable TAVR positioning. FINDINGS  Left Ventricle: Left ventricular ejection fraction, by estimation, is 20 to 25%. Left ventricular ejection fraction by 2D MOD biplane is 21.5 %. The left ventricle has severely decreased function. The left ventricle demonstrates global hypokinesis. The left ventricular internal cavity size was normal in size. There is no left ventricular hypertrophy. Left ventricular diastolic parameters are consistent with Grade II diastolic dysfunction (pseudonormalization). Right Ventricle: The right ventricular size is normal. Right vetricular wall thickness was not well visualized. Right ventricular systolic function is mildly reduced. Tricuspid regurgitation signal is inadequate for assessing PA pressure. Left Atrium: Left atrial size was moderately dilated. Right Atrium: Right atrial size was normal in size. Pericardium: There is no evidence of  pericardial effusion. Mitral Valve: The mitral valve is abnormal. Mild to moderate mitral annular calcification. Trivial mitral valve regurgitation. No evidence of mitral valve stenosis. MV peak gradient, 5.6 mmHg. The mean mitral valve gradient is 2.0 mmHg. Tricuspid Valve: The tricuspid valve is grossly normal. Tricuspid valve regurgitation is trivial. Aortic  Valve: The aortic valve has been repaired/replaced. Aortic valve regurgitation is not visualized. No aortic stenosis is present. Aortic valve mean gradient measures 5.7 mmHg. Aortic valve peak gradient measures 10.5 mmHg. Aortic valve area, by VTI measures 0.90  cm. There is a Sapien prosthetic, stented (TAVR) valve present in the aortic position. Pulmonic Valve: The pulmonic valve was grossly normal. Pulmonic valve regurgitation is not visualized. No evidence of pulmonic stenosis. Aorta: The aortic root and ascending aorta are structurally normal, with no evidence of dilitation. Venous: The inferior vena cava is normal in size with greater than 50% respiratory variability, suggesting right atrial pressure of 3 mmHg. IAS/Shunts: The atrial septum is grossly normal.  LEFT VENTRICLE PLAX 2D                        Biplane EF (MOD) LVIDd:         5.20 cm         LV Biplane EF:   Left LVIDs:         4.00 cm                          ventricular LV PW:         0.70 cm                          ejection LV IVS:        1.00 cm                          fraction by LVOT diam:     1.80 cm                          2D MOD LV SV:         30                               biplane is LV SV Index:   19                               21.5 %. LVOT Area:     2.54 cm                                Diastology                                LV e' medial:    4.05 cm/s LV Volumes (MOD)               LV E/e' medial:  23.5 LV vol d, MOD    80.6 ml       LV e' lateral:   6.64 cm/s A2C:                           LV E/e' lateral: 14.3 LV vol d, MOD    70.6 ml A4C: LV vol s, MOD    63.7 ml  A2C: LV vol s, MOD    57.5 ml A4C: LV SV MOD A2C:   16.9 ml LV SV MOD A4C:  70.6 ml LV SV MOD BP:    16.9 ml RIGHT VENTRICLE RV Basal diam:  4.00 cm RV Mid diam:    2.30 cm RV S prime:     7.65 cm/s TAPSE (M-mode): 1.3 cm LEFT ATRIUM             Index       RIGHT ATRIUM           Index LA diam:        3.70 cm 2.33 cm/m  RA Area:     10.30 cm LA Vol (A2C):   72.0 ml 45.40 ml/m RA Volume:   20.70 ml  13.05 ml/m LA Vol (A4C):   57.5 ml 36.26 ml/m LA Biplane Vol: 65.8 ml 41.49 ml/m  AORTIC VALVE                    PULMONIC VALVE AV Area (Vmax):    0.88 cm     PV Vmax:       0.66 m/s AV Area (Vmean):   0.84 cm     PV Vmean:      44.000 cm/s AV Area (VTI):     0.90 cm     PV VTI:        0.150 m AV Vmax:           162.33 cm/s  PV Peak grad:  1.7 mmHg AV Vmean:          106.333 cm/s PV Mean grad:  1.0 mmHg AV VTI:            0.335 m AV Peak Grad:      10.5 mmHg AV Mean Grad:      5.7 mmHg LVOT Vmax:         56.30 cm/s LVOT Vmean:        35.000 cm/s LVOT VTI:          0.119 m LVOT/AV VTI ratio: 0.35  AORTA Ao Asc diam: 2.90 cm MITRAL VALVE MV Area (PHT): 4.21 cm    SHUNTS MV Area VTI:   0.72 cm    Systemic VTI:  0.12 m MV Peak grad:  5.6 mmHg    Systemic Diam: 1.80 cm MV Mean grad:  2.0 mmHg MV Vmax:       1.18 m/s MV Vmean:      62.5 cm/s MV Decel Time: 180 msec MV E velocity: 95.10 cm/s MV A velocity: 66.00 cm/s MV E/A ratio:  1.44 Rudean Haskell MD Electronically signed by Rudean Haskell MD Signature Date/Time: 09/01/2020/12:58:07 PM    Final     Cardiac Studies    2D Echo (POD#1 study): IMPRESSIONS     1. The aortic valve has been repaired/replaced. Well seated 23 mm Sapien  3 Valve with DVI 0.35, no paravalvular leak, and with appropriate  gradients and acceleration time. Aortic valve regurgitation is not  visualized. No aortic stenosis is present.  Aortic valve mean gradient measures 5.7 mmHg. Aortic valve Vmax measures  1.62 m/s. Aortic valve acceleration time measures 80 msec.    2. Left ventricular ejection fraction, by estimation, is 20 to 25%. Left  ventricular ejection fraction by 2D MOD biplane is 21.5 %. The left  ventricle has severely decreased function. The left ventricle demonstrates  global hypokinesis. Left ventricular  diastolic parameters are consistent with Grade II diastolic dysfunction  (pseudonormalization).   3. Right ventricular systolic function is mildly reduced. The right  ventricular size is normal. Tricuspid regurgitation signal is  inadequate  for assessing PA pressure.   4. Left atrial size was moderately dilated.   5. The mitral valve is abnormal. Trivial mitral valve regurgitation. No  evidence of mitral stenosis.   6. The inferior vena cava is normal in size with greater than 50%  respiratory variability, suggesting right atrial pressure of 3 mmHg.   Comparison(s): A prior study was performed on 08/31/20. Prior images  reviewed side by side. Stable TAVR positioning.  Patient Profile     86 y.o. female admitted with acute systolic heart failure, EF 20 to 25% with critical aortic stenosis.  She was treated with TAVR valve on August 31, 2020, Dr. Burt Knack.  Assessment & Plan    Critical aortic stenosis - Status post TAVR, postop day 3.  Doing very well.  Excellent echocardiogram postop.  Mean gradient 6 mmHg.  No perivalvular leak.  On aspirin and Plavix.  Appreciate structural heart team.  Acute systolic heart failure - Currently well compensated.  Blood pressures are soft.  Unable to utilize traditional medications such as Entresto or beta-blocker.  We are avoiding diuretics at this time given that her aortic stenosis is corrected with TAVR valve.  She is able to ambulate now without difficulty.  No dizziness. -She is currently on empagliflozin.  Paroxysmal atrial fibrillation - This was transient post TAVR.  She is now off of amiodarone.  No further atrial fibrillation.  Telemetry personally reviewed.  Okay with discharge.  Her friend  at bedside present. She will have follow-up with structural heart clinic.    For questions or updates, please contact [place] Please consult [email or web address] for contact info under        Signed, Candee Furbish, MD  09/03/2020, 8:23 AM

## 2020-09-03 NOTE — Progress Notes (Signed)
Physical Therapy Treatment Patient Details Name: Alexandria Randolph MRN: [id number] DOB: 08-09-1934 Today's Date: 09/03/2020    History of Present Illness Patient is a 86 y/o female who presents on 08/21/20 as a tx from [facility] due to SOB and weakness. BNP 2105. Chest CTA- multifocal PNA, bil pleral effusions and possible bronchitis. Concern for new CHF, aortic stenosis, Elevated D-dimer.  s/p cardiac cath 08/23/20; TAVR 08/31/20. PMH includes HTN, dyslipidemia, breast ca, tobacco abuse.    PT Comments    Pt received in supine, agreeable to therapy session with encouragement and with good participation and tolerance for gait and stair training. Pt reporting mild LBP and reluctant to attempt OOB, given instruction on benefits of continuing mobility as she is planning to discharge today, then later says "It's not hurting" and deferred pain meds for back pain. Pt needing up to minA for transfers from low toilet height with wall rail but otherwise Supervsion for gait/stair tasks. Pt receptive to instruction, needed cues for hand placement and proximity to RW. Pt continues to benefit from PT services to progress toward functional mobility goals.   Follow Up Recommendations  Home health PT;Supervision - Intermittent     Equipment Recommendations  Rolling walker with 5" wheels    Recommendations for Other Services       Precautions / Restrictions Precautions Precautions: Fall Restrictions Weight Bearing Restrictions: No    Mobility  Bed Mobility Overal bed mobility: Modified Independent      General bed mobility comments: flat bed, no rails.  Pt in/out of bed without assist    Transfers Overall transfer level: Needs assistance Equipment used: Rolling walker (2 wheeled) Transfers: Sit to/from Stand Sit to Stand: Supervision;Min assist  General transfer comment: minA from low toilet height also using wall rail and RW but unable to achieve upright without boost assist; Supervision from recliner  and EOB to RW vcs for hand placement needed  Ambulation/Gait Ambulation/Gait assistance: Supervision Gait Distance (Feet): 120 Feet (52ft to toilet, then 120ft back from stairs to room) Assistive device: Rolling walker (2 wheeled) Gait Pattern/deviations: Step-through pattern (downward gaze)   Gait velocity interpretation: <1.8 ft/sec, indicate of risk for recurrent falls General Gait Details: Generally steady and slower. Cues for proximity to the RW and visual demo given to reinforce positioning with RW.  Pt was mildly SOB with gait, but VSS overall, RR elevated to mid-30s with exertion, SpO2 and HR WNL on RA   Stairs Stairs: Yes Stairs assistance: Supervision Stair Management: Two rails;Step to pattern;Forwards Number of Stairs: 4 General stair comments: verbal cues for sequencing but pt reports both legs "about the same", cues for safety and step-to gait pattern, no buckling or LOB; mild DOE but VSS on RA. Encouraged her to ask her friend to place a chair at bottom/top of steps for her to rest if needed.   Wheelchair Mobility    Modified Rankin (Stroke Patients Only)       Balance Overall balance assessment: Needs assistance Sitting-balance support: Feet supported;No upper extremity supported Sitting balance-Leahy Scale: Good     Standing balance support: During functional activity Standing balance-Leahy Scale: Fair Standing balance comment: Encouraged use of RW as pt quick to fatigue generally                            Cognition Arousal/Alertness: Awake/alert Behavior During Therapy: WFL for tasks assessed/performed Overall Cognitive Status: Within Functional Limits for tasks assessed Area of Impairment: Memory  Memory: Decreased short-term memory         General Comments: Appears WNL for basic mobility tasks but decreased recall of safety cues for hand placement within session.      Exercises Other Exercises Other  Exercises: cues for ankle pumps, quad sets, glute sets t/o day    General Comments General comments (skin integrity, edema, etc.): VSS on RA, HR 70's-102 bpm with exertion, cued for standing breaks with cues for deep breaths as pt RR elevated to 33 rpm with exertion.      Pertinent Vitals/Pain Pain Assessment: Faces Faces Pain Scale: Hurts a little bit Pain Location: LBP prior to OOB Pain Descriptors / Indicators: Discomfort Pain Intervention(s): Monitored during session;Repositioned    Home Living                      Prior Function            PT Goals (current goals can now be found in the care plan section) Acute Rehab PT Goals Patient Stated Goal: to get better and go home PT Goal Formulation: With patient Time For Goal Achievement: 09/06/20 Progress towards PT goals: Progressing toward goals    Frequency    Min 3X/week      PT Plan Current plan remains appropriate    Co-evaluation              AM-PAC PT "6 Clicks" Mobility   Outcome Measure  Help needed turning from your back to your side while in a flat bed without using bedrails?: None Help needed moving from lying on your back to sitting on the side of a flat bed without using bedrails?: None Help needed moving to and from a bed to a chair (including a wheelchair)?: A Little Help needed standing up from a chair using your arms (e.g., wheelchair or bedside chair)?: A Little Help needed to walk in hospital room?: A Little Help needed climbing 3-5 steps with a railing? : A Little 6 Click Score: 20    End of Session Equipment Utilized During Treatment: Gait belt Activity Tolerance: Patient tolerated treatment well;Patient limited by fatigue Patient left: with call bell/phone within reach;in chair;Other (comment) (pharmacy tech entering room to speak with her.) Nurse Communication: Mobility status PT Visit Diagnosis: Unsteadiness on feet (R26.81);Difficulty in walking, not elsewhere classified  (R26.2)     Time: 5284-1324 PT Time Calculation (min) (ACUTE ONLY): 28 min  Charges:  $Gait Training: 23-37 mins                     Cendy Oconnor P., PTA Acute Rehabilitation Services Pager: [phone] Office: [place] 09/03/2020, 12:06 PM

## 2020-09-03 NOTE — TOC Transition Note (Signed)
Transition of Care (TOC) - CM/SW Discharge Note Marvetta Gibbons RN, BSN Transitions of Care Unit 4E- RN Case Manager See Treatment Team for direct phone #    Patient Details  Name: Alexandria Randolph MRN: [id number] Date of Birth: [birth year]  Transition of Care [facility]) CM/SW Contact:  Dawayne Patricia, RN Phone Number: 09/03/2020, 2:27 PM   Clinical Narrative:    Pt stable for transition home today, orders have been placed for [facility] and DME needs.  Cm in to speak with pt at bedside, pt agreeable to [facility] services- list provided for choice Per CMS guidelines from medicare.gov website with star ratings (copy placed in shadow chart), pt states she does not know anything about HH and defers to this CM to secure needed services using the highest star ratings as guide for first choice.  Pt needs RW for home- and is agreeable to using in house provider.   Address, phone #s and PCP all confirmed with pt in epic.   Call made to [facility] for DME need- RW to be delivered to room prior to discharge.   TOC pharmacy to fill meds and deliver to bedside  Call made to Amy with [facility] for HHPT/OT referral- referral has been accepted.    Final next level of care: [place] Barriers to Discharge: No Barriers Identified   Patient Goals and CMS Choice Patient states their goals for this hospitalization and ongoing recovery are:: return home CMS Medicare.gov Compare Post Acute Care list provided to:: Patient Choice offered to / list presented to : Patient  Discharge Placement                 Home with [facility]      Discharge Plan and Services   Discharge Planning Services: CM Consult Post Acute Care Choice: Home Health, Durable Medical Equipment          DME Arranged: Walker rolling DME Agency: [facility] Date DME Agency Contacted: 09/03/20 Time DME Agency Contacted: 70 Representative spoke with at DME Agency: Freda Munro HH Arranged: PT, OT [facility] Agency: [place] Date [place]: 09/03/20 Time [place]: 1206 Representative spoke with at [place]: Amy  Social Determinants of Health ([place]) Interventions Food Insecurity Interventions: Intervention Not Indicated Financial Strain Interventions: Intervention Not Indicated Housing Interventions: Intervention Not Indicated Transportation Interventions: Intervention Not Indicated   Readmission Risk Interventions Readmission Risk Prevention Plan 09/03/2020  Transportation Screening Complete  PCP or Specialist Appt within 5-7 Days Complete  Home Care Screening Complete  Medication Review (RN CM) Complete  Some recent data might be hidden

## 2020-09-06 NOTE — Progress Notes (Signed)
[facility]  PCP: Jodi Mourning Primary Cardiologist:Schumman, Harrell Gave  HPI:  Alexandria Randolph is a 86 y.o.  female  with a PMH significant for hypertension, hyperlipidemia, breast cancer s/p lumpectomy with radioactive seed in 01/2017, and tobacco use disorder     No prior cardiac history until 08/21/20.  Patient presented to [facility] ED for 2 weeks of increasing dyspnea. EKG showed sinus tachycardia, rate 112 bpm, with PVC and T wave inversions in in lateral leads. High-sensitivity troponin elevated and flat at 372 >> 389. BNP markedly elevated at 2,105.  CTA negative for PE.  Started on IV Lasix as well as antibiotics. ECHO ordered cardiology consulted.  ECHO with EF 20-25%, G2DD, severe AS.  Decision was made to proceed with [facility] for further workup and TAVR consideration.  LHC/RHC with minimal CAD with at most 30 to 40% ostial left main.  She has critical AS with a peak to peak gradient of 72 mmHg and a valve area of 0.39 cm indexed to 0.25 cm/m.  She underwent TAVR during her hospitalization with excellent result.  Repeat ECHO with resolution of aortic stenosis and normal functioning replacement valve, EF remained 20-25%.  GDMT limited by low bp. Was tolerating SGLT2i during hospitalization.     Breathing well, walking around the house and doing her ADL's without getting dyspneic.  Denies chest pain.  Does have some occasional dizziness with standing up too quickly.     ROS: All systems negative except as listed in HPI, PMH and Problem List.  SH:  Social History   Socioeconomic History   Marital status: Single    Spouse name: Not on file   Number of children: 0   Years of education: Not on file   Highest education level: Not on file  Occupational History   Occupation: Retired  Tobacco Use   Smoking status: Former    Pack years: 0.00    Types: Cigarettes    Quit date: 03/20/2000    Years since quitting: 20.4   Smokeless tobacco: Never    Tobacco comments:    Pt states that she smoked off and on- "I don't know for how long"  Substance and Sexual Activity   Alcohol use: Not Currently    Alcohol/week: 0.0 standard drinks    Comment: occ   Drug use: No   Sexual activity: Not on file  Other Topics Concern   Not on file  Social History Narrative   Married   Social Determinants of Health   Financial Resource Strain: Low Risk    Difficulty of Paying Living Expenses: Not hard at all  Food Insecurity: No Food Insecurity   Worried About Charity fundraiser in the Last Year: Never true   Arboriculturist in the Last Year: Never true  Transportation Needs: No Transportation Needs   Lack of Transportation (Medical): No   Lack of Transportation (Non-Medical): No  Physical Activity: Not on file  Stress: Not on file  Social Connections: Not on file  Intimate Partner Violence: Not on file    FH:  Family History  Problem Relation Age of Onset   Cancer Mother        "Throat" and colon cancer, both at an advanced age   Diabetes Father    Breast cancer Paternal Aunt    Colon cancer Neg Hx     Past Medical History:  Diagnosis Date   Allergy    SEASONAL   Cataracts, bilateral  DYSLIPIDEMIA 12/01/2006   Environmental allergies    has had allergy test:grass,trees,plants,dust   HYPERGLYCEMIA 12/01/2006   HYPERTENSION 12/01/2006   OSTEOPOROSIS 12/01/2006   S/P TAVR (transcatheter aortic valve replacement) 08/31/2020   s/p TAVR with 23 mm Edwards S3U via the TF approach by Dr. Cyndia Bent and Dr. Burt Knack.   Severe aortic stenosis    SMOKER 12/01/2006    Current Outpatient Medications  Medication Sig Dispense Refill   aspirin 81 MG chewable tablet Chew 1 tablet (81 mg total) by mouth daily. 30 tablet 3   cefdinir (OMNICEF) 300 MG capsule Take 300 mg by mouth. Patient takes 1 capsule daily.     clopidogrel (PLAVIX) 75 MG tablet Take 1 tablet (75 mg total) by mouth daily with breakfast. 30 tablet 3   doxycycline (DORYX)  100 MG EC tablet Take 100 mg by mouth. Patient takes 1 tablet daily.     fexofenadine (ALLEGRA) 180 MG tablet Take 180 mg by mouth daily.     Polyethyl Glycol-Propyl Glycol (SYSTANE FREE OP) Place 1 drop into both eyes daily as needed (For dry eyes).     letrozole (FEMARA) 2.5 MG tablet Take 1 tablet (2.5 mg total) by mouth daily. (Patient not taking: Reported on 09/07/2020) 90 tablet 3   No current facility-administered medications for this encounter.    Vitals:   09/07/20 1006  BP: 114/60  Pulse: 80  SpO2: 98%  Weight: 57.8 kg (127 lb 6.4 oz)    PHYSICAL EXAM: Cardiac: JVD flat, normal rate and rhythm, clear s1 and s2, no murmurs, rubs or gallops, no LE edema Pulmonary: rales LLL compatible with persistent effusion, otherwise clear, not in distress Abdominal: non distended abdomen, soft and nontender Psych: Alert, conversant, in good spirits TAVR access sites: L groin well healed, no hematoma or pseudoaneurysm, R groin well healed, no hematoma or pseudoaneurysm     ECG  NSR rate 78, LBBB, 1st degree avb  ASSESSMENT & PLAN: Systolic CHF: -08/2227 pre TAVR ECHO with EF 20-25%, G2DD, severe AS.   -LHC/RHC with minimal CAD with at most 30 to 40% ostial left main.  She has critical AS with a peak to peak gradient of 72 mmHg and a valve area of 0.39 cm indexed to 0.25 cm/m. -08/31/2020 successful TAVR -Post TAVR ECHO normal functioning valve, EF remained depressed at 20-25% -NYHA Class III, but severely deconditioned, euvolemic on exam -Tolerated SGLT2i during hospitalization was not discharged on this, will restart Jardiance 10 today.  With dizziness and hypotension low and slow titration of GDMT will be the goal.  -New LBBB post TAVR -repeat ECHO scheduled for her follow up in one month -would benefit from physical therapy  Critical Aortic Stenosis: S/P TAVR: -New Onset LBBB after TAVR with QRS 156 -Groin sites look good bilaterally, denies any bleeding or  discomfort   Follow up with [facility]

## 2020-09-07 ENCOUNTER — Other Ambulatory Visit: Payer: Self-pay

## 2020-09-07 ENCOUNTER — Ambulatory Visit (HOSPITAL_COMMUNITY)
Admit: 2020-09-07 | Discharge: 2020-09-07 | Disposition: A | Discharge status: Home / Self Care | Payer: Medicare Other | Source: Ambulatory Visit | Attending: Internal Medicine | Admitting: Internal Medicine

## 2020-09-07 ENCOUNTER — Encounter (HOSPITAL_COMMUNITY): Payer: Self-pay

## 2020-09-07 ENCOUNTER — Other Ambulatory Visit (HOSPITAL_COMMUNITY): Payer: Self-pay

## 2020-09-07 ENCOUNTER — Telehealth (HOSPITAL_COMMUNITY): Payer: Self-pay | Admitting: Surgery

## 2020-09-07 VITALS — BP 114/60 | HR 80 | Wt 127.4 lb

## 2020-09-07 DIAGNOSIS — I5042 Chronic combined systolic (congestive) and diastolic (congestive) heart failure: Secondary | ICD-10-CM

## 2020-09-07 DIAGNOSIS — Z87891 Personal history of nicotine dependence: Secondary | ICD-10-CM | POA: Diagnosis not present

## 2020-09-07 DIAGNOSIS — Z7982 Long term (current) use of aspirin: Secondary | ICD-10-CM | POA: Diagnosis not present

## 2020-09-07 DIAGNOSIS — I110 Hypertensive heart disease with heart failure: Secondary | ICD-10-CM | POA: Insufficient documentation

## 2020-09-07 DIAGNOSIS — Z79899 Other long term (current) drug therapy: Secondary | ICD-10-CM | POA: Diagnosis not present

## 2020-09-07 DIAGNOSIS — Z7902 Long term (current) use of antithrombotics/antiplatelets: Secondary | ICD-10-CM | POA: Insufficient documentation

## 2020-09-07 DIAGNOSIS — E785 Hyperlipidemia, unspecified: Secondary | ICD-10-CM | POA: Insufficient documentation

## 2020-09-07 DIAGNOSIS — I2510 Atherosclerotic heart disease of native coronary artery without angina pectoris: Secondary | ICD-10-CM | POA: Insufficient documentation

## 2020-09-07 DIAGNOSIS — R42 Dizziness and giddiness: Secondary | ICD-10-CM | POA: Insufficient documentation

## 2020-09-07 DIAGNOSIS — I350 Nonrheumatic aortic (valve) stenosis: Secondary | ICD-10-CM | POA: Insufficient documentation

## 2020-09-07 DIAGNOSIS — I447 Left bundle-branch block, unspecified: Secondary | ICD-10-CM | POA: Insufficient documentation

## 2020-09-07 DIAGNOSIS — Z952 Presence of prosthetic heart valve: Secondary | ICD-10-CM | POA: Diagnosis not present

## 2020-09-07 MED ORDER — EMPAGLIFLOZIN 10 MG PO TABS
10.0000 mg | ORAL_TABLET | Freq: Every day | ORAL | 11 refills | Status: DC
  Filled 2020-09-07: qty 30, 30d supply, fill #0
  Filled ????-??-??: fill #1

## 2020-09-07 MED ORDER — EMPAGLIFLOZIN 10 MG PO TABS: 10.0000 mg | ORAL_TABLET | Freq: Every day | ORAL | 11 refills | Status: DC

## 2020-09-07 NOTE — Progress Notes (Signed)
[facility] Heart Failure Pharmacist Encounter  PCP: Marrian Salvage, FNP PCP-Cardiologist: Donato Heinz, MD  Was successful in obtaining a copay card for Jardiance.  This copay card will make the patients copay $10 per fill (1-3 month supply).  The billing information is as follows and has been shared with the patient  RxBin: [id number] PCN: Loyalty Member ID: [id number] Group ID: [id number]  Kerby Nora, PharmD, BCPS Heart Failure Transitions of Care Pharmacist Phone [phone]

## 2020-09-07 NOTE — Patient Instructions (Signed)
EKG done today.  No Labs done today.   RESTART Jardiance 10mg  (1 tablet) by mouth daily.   No other medication changes were made. Please continue all current medications as prescribed.  Your physician recommends that you keep your pending appointment with Cardiology. (See next page)

## 2020-09-07 NOTE — Telephone Encounter (Signed)
I called patient to remind her of Heart Impact Appt today and provide directions to clinic if necessary.  She is aware of appt and is "leaving soon" to come.

## 2020-09-08 ENCOUNTER — Encounter (HOSPITAL_COMMUNITY): Payer: Self-pay

## 2020-09-15 ENCOUNTER — Telehealth (HOSPITAL_COMMUNITY): Payer: Self-pay

## 2020-09-15 ENCOUNTER — Other Ambulatory Visit (HOSPITAL_COMMUNITY): Payer: Self-pay

## 2020-09-15 NOTE — Telephone Encounter (Signed)
Pharmacy Transitions of Care Follow-up Telephone Call  Date of discharge: 09/03/20  Discharge Diagnosis: Community Acquired Pneumonia  How have you been since you were released from the hospital? Patient is well. Has been using her walker more and feels good since discharge.  Medication changes made at discharge:           START taking: Aspirin Low Dose (aspirin)  clopidogrel (PLAVIX)   CHANGE: cefdinir 300 MG capsule (OMNICEF)  doxycycline 100 MG tablet (VIBRA-TABS)   Medication changes verified by the patient? Yes    Medication Accessibility:  Home Pharmacy: [facility]   Was the patient provided with refills on discharged medications? Yes   Have all prescriptions been transferred from [facility] to home pharmacy? No, [facility] will mail rxs   Is the patient able to afford medications? Yes, has insurance    Medication Review: CLOPIDOGREL (PLAVIX) Clopidogrel 75 mg once daily.  - Educated patient on expected duration of therapy with clopidogrel.  - Reviewed potential DDIs with patient  - Advised patient of medications to avoid (NSAIDs, ASA)  - Educated that Tylenol (acetaminophen) will be the preferred analgesic to prevent risk of bleeding  - Emphasized importance of monitoring for signs and symptoms of bleeding (abnormal bruising, prolonged bleeding, nose bleeds, bleeding from gums, discolored urine, black tarry stools)  - Advised patient to alert all providers of anticoagulation therapy prior to starting a new medication or having a procedure   Follow-up Appointments:  [facility] f/u appt confirmed? Scheduled to see Dr. Grandville Silos on 10/07/20 @ Cardiology.   If their condition worsens, is the pt aware to call PCP or go to the Emergency Dept.? yes  Final Patient Assessment: Patient doing well and has follow up scheduled. Has refills at home pharmacy.

## 2020-09-23 ENCOUNTER — Ambulatory Visit: Payer: Federal, State, Local not specified - PPO | Admitting: Physician Assistant

## 2020-09-27 DIAGNOSIS — Z20822 Contact with and (suspected) exposure to covid-19: Secondary | ICD-10-CM | POA: Diagnosis not present

## 2020-09-28 ENCOUNTER — Other Ambulatory Visit (HOSPITAL_COMMUNITY): Payer: Self-pay

## 2020-10-04 ENCOUNTER — Telehealth: Payer: Self-pay | Admitting: Family

## 2020-10-04 ENCOUNTER — Other Ambulatory Visit (HOSPITAL_COMMUNITY): Payer: Self-pay

## 2020-10-04 ENCOUNTER — Telehealth: Payer: Self-pay

## 2020-10-04 MED ORDER — CLOPIDOGREL BISULFATE 75 MG PO TABS: 75.0000 mg | ORAL_TABLET | Freq: Every day | ORAL | 0 refills | Status: DC

## 2020-10-04 MED ORDER — EMPAGLIFLOZIN 10 MG PO TABS: 10.0000 mg | ORAL_TABLET | Freq: Every day | ORAL | 0 refills | Status: DC

## 2020-10-04 MED ORDER — ASPIRIN 81 MG PO CHEW: 81.0000 mg | CHEWABLE_TABLET | Freq: Every day | ORAL | 0 refills | Status: DC

## 2020-10-04 NOTE — Telephone Encounter (Signed)
Patient called in to request a refill of Jardiance, Plavix and Aspirin.   Reviewed chart and informed patient that her meds were sent to [facility] and [facility]. She states she does not know why because she told the provider to [facility] the [facility].   She requested we [facility] [address].   Patient has an appointment coming up with Nell Range, PA-C.  Rx(s) sent to pharmacy electronically.  Patient voiced understanding.

## 2020-10-04 NOTE — Telephone Encounter (Signed)
I have called pt and left a message to call back informing her we are no able to fill these meds because cardiology takes care of them.

## 2020-10-04 NOTE — Telephone Encounter (Signed)
aspirin 81 MG chewable tablet clopidogrel (PLAVIX) 75 MG tablet empagliflozin (JARDIANCE) 10 MG TABS tablet   Patient needs a refill on these 3 prescriptions [facility] [address], [place], [place] [address]

## 2020-10-07 ENCOUNTER — Ambulatory Visit (INDEPENDENT_AMBULATORY_CARE_PROVIDER_SITE_OTHER): Payer: Medicare Other | Admitting: Physician Assistant

## 2020-10-07 ENCOUNTER — Other Ambulatory Visit (HOSPITAL_COMMUNITY): Payer: Federal, State, Local not specified - PPO

## 2020-10-07 ENCOUNTER — Other Ambulatory Visit: Payer: Self-pay

## 2020-10-07 ENCOUNTER — Ambulatory Visit (HOSPITAL_COMMUNITY): Payer: Medicare Other | Attending: Cardiology

## 2020-10-07 ENCOUNTER — Encounter: Payer: Self-pay | Admitting: Physician Assistant

## 2020-10-07 ENCOUNTER — Encounter (INDEPENDENT_AMBULATORY_CARE_PROVIDER_SITE_OTHER): Payer: Self-pay

## 2020-10-07 VITALS — BP 130/60 | HR 56 | Ht 60.0 in | Wt 127.0 lb

## 2020-10-07 DIAGNOSIS — Z952 Presence of prosthetic heart valve: Secondary | ICD-10-CM | POA: Insufficient documentation

## 2020-10-07 DIAGNOSIS — R918 Other nonspecific abnormal finding of lung field: Secondary | ICD-10-CM | POA: Diagnosis not present

## 2020-10-07 DIAGNOSIS — I5042 Chronic combined systolic (congestive) and diastolic (congestive) heart failure: Secondary | ICD-10-CM | POA: Diagnosis not present

## 2020-10-07 LAB — ECHOCARDIOGRAM COMPLETE
AR max vel: 1.64 cm2
AV Area VTI: 1.92 cm2
AV Area mean vel: 1.76 cm2
AV Mean grad: 8 mmHg
AV Peak grad: 15.6 mmHg
Ao pk vel: 1.98 m/s
Area-P 1/2: 3.36 cm2
S' Lateral: 3 cm

## 2020-10-07 MED ORDER — CLOPIDOGREL BISULFATE 75 MG PO TABS: 75.0000 mg | ORAL_TABLET | Freq: Every day | ORAL | 1 refills | Status: DC

## 2020-10-07 MED ORDER — AMOXICILLIN 500 MG PO TABS: ORAL_TABLET | ORAL | 1 refills | Status: AC

## 2020-10-07 NOTE — Patient Instructions (Addendum)
Medication Instructions:  Your physician recommends that you continue on your current medications as directed. BUT YOU ARE TO STOP THE PLAVIX ON 03/02/2021  *If you need a refill on your cardiac medications before your next appointment, please call your pharmacy*   Lab Work: None ordered If you have labs (blood work) drawn today and your tests are completely normal, you will receive your results only by: [place] (if you have MyChart) OR A paper copy in the mail If you have any lab test that is abnormal or we need to change your treatment, we will call you to review the results.   Testing/Procedures: Your physician recommends you have a CT of the Chest on or after 10/26/2020.  Non-Cardiac CT scanning, (CAT scanning), is a noninvasive, special x-ray that produces cross-sectional images of the body using x-rays and a computer. CT scans help physicians diagnose and treat medical conditions. For some CT exams, a contrast material is used to enhance visibility in the area of the body being studied. CT scans provide greater clarity and reveal more details than regular x-ray exams.   Follow-Up: At [facility], you and your health needs are our priority.  As part of our continuing mission to provide you with exceptional heart care, we have created designated Provider Care Teams.  These Care Teams include your primary Cardiologist (physician) and Advanced Practice Providers (APPs -  Physician Assistants and Nurse Practitioners) who all work together to provide you with the care you need, when you need it.  We recommend signing up for the patient portal called "MyChart".  Sign up information is provided on this After Visit Summary.  MyChart is used to connect with patients for Virtual Visits (Telemedicine).  Patients are able to view lab/test results, encounter notes, upcoming appointments, etc.  Non-urgent messages can be sent to your provider as well.   To learn more about what you can do with  MyChart, go to [email or web address].    Your next appointment:   4 month(s)  The format for your next appointment:   In Person  Provider:   Oswaldo Milian, MD   Other Instructions

## 2020-10-07 NOTE — Progress Notes (Signed)
[facility]                                     Cardiology Office Note:    Date:  10/07/2020   ID:  Candie Echevaria, DOB 1934-05-29, MRN [id number]  PCP:  Marrian Salvage, [place] Cardiologist:  Donato Heinz, MD / Dr. Burt Knack & Dr. Cyndia Bent (TAVR) [facility] Electrophysiologist:  None   Referring MD: Marrian Salvage,*   1 month s/p TAVR  History of Present Illness:    Alexandria Randolph is a 86 y.o. female with a hx of HTN, HLD, breast cancer s/p lumpectomy and radioactive seed Rx (2018) and remote, chronic combined S/D CHF with recent admission for acute CHF and severe AS s/p TAVR (08/31/20) who presents to clinic for follow up.   No prior cardiac history until 08/21/20.  Patient presented to [facility] ED for 2 weeks of increasing dyspnea. EKG showed sinus tachycardia, rate 112 bpm, with PVC and T wave inversions in in lateral leads. High-sensitivity troponin elevated and flat at 372 >> 389. BNP markedly elevated at 2,105.  CTA negative for PE.  Started on IV Lasix as well as antibiotics. ECHO ordered cardiology consulted.  ECHO with EF 20-25%, G2DD, severe AS with a mean gradient over 40 mm hg despite low EF.  Decision was made to proceed with [facility] for further workup and TAVR consideration.  LHC/RHC with minimal CAD with at most 30 to 40% ostial left main.  She has critical AS with a peak to peak gradient of 72 mmHg and a valve area of 0.39 cm indexed to 0.25 cm/m.  She underwent TAVR with a 23 mm Edwards S3U via the TF approach during her hospitalization with excellent result.  Repeat ECHO with resolution of aortic stenosis and normal functioning replacement valve, EF remained 20-25%.  GDMT limited by low bp. Was tolerating SGLT2i during hospitalization but it was not rx'd at discharge. Jardiance was added back at advanced CHF follow up. GDMT limited by persistant hypotension and orthostasis.   Today the  patient presents to clinic for follow up. Here with [name] and "doing great!." She feels back to her normal self since valve surgery. No CP or SOB. No LE edema, orthopnea or PND. No dizziness or syncope. No blood in stool or urine. No palpitations.     Past Medical History:  Diagnosis Date   Allergy    SEASONAL   Cataracts, bilateral    DYSLIPIDEMIA 12/01/2006   Environmental allergies    has had allergy test:grass,trees,plants,dust   HYPERGLYCEMIA 12/01/2006   HYPERTENSION 12/01/2006   OSTEOPOROSIS 12/01/2006   S/P TAVR (transcatheter aortic valve replacement) 08/31/2020   s/p TAVR with 23 mm Edwards S3U via the TF approach by Dr. Cyndia Bent and Dr. Burt Knack.   Severe aortic stenosis    SMOKER 12/01/2006    Past Surgical History:  Procedure Laterality Date   ABDOMINAL HYSTERECTOMY     BREAST LUMPECTOMY WITH RADIOACTIVE SEED LOCALIZATION Right 01/19/2017   Procedure: RIGHT BREAST LUMPECTOMY WITH RADIOACTIVE SEED LOCALIZATION;  Surgeon: Fanny Skates, MD;  Location: [place];  Service: General;  Laterality: Right;   COLONOSCOPY     INTRAOPERATIVE TRANSTHORACIC ECHOCARDIOGRAM Left 08/31/2020   Procedure: INTRAOPERATIVE TRANSTHORACIC ECHOCARDIOGRAM;  Surgeon: Sherren Mocha, MD;  Location: [place];  Service: Open Heart Surgery;  Laterality: Left;  POLYPECTOMY     RIGHT/LEFT HEART CATH AND CORONARY ANGIOGRAPHY N/A 08/23/2020   Procedure: RIGHT/LEFT HEART CATH AND CORONARY ANGIOGRAPHY;  Surgeon: Lorretta Harp, MD;  Location: [place] [facility];  Service: Cardiovascular;  Laterality: N/A;   TOTAL ABDOMINAL HYSTERECTOMY W/ BILATERAL SALPINGOOPHORECTOMY  1980   TRANSCATHETER AORTIC VALVE REPLACEMENT, TRANSFEMORAL N/A 08/31/2020   Procedure: TRANSCATHETER AORTIC VALVE REPLACEMENT, TRANSFEMORAL;  Surgeon: Sherren Mocha, MD;  Location: [place];  Service: Open Heart Surgery;  Laterality: N/A;   ULTRASOUND GUIDANCE FOR VASCULAR ACCESS Bilateral 08/31/2020   Procedure: ULTRASOUND  GUIDANCE FOR VASCULAR ACCESS;  Surgeon: Sherren Mocha, MD;  Location: [place];  Service: Open Heart Surgery;  Laterality: Bilateral;    Current Medications: Current Meds  Medication Sig   amoxicillin (AMOXIL) 500 MG tablet TAKE 4 TABLETS BY MOUTH 1 HOUR PRIOR TO ANY DENTAL PROCEDURES   aspirin 81 MG chewable tablet Chew 1 tablet (81 mg total) by mouth daily.   empagliflozin (JARDIANCE) 10 MG TABS tablet Take 1 tablet (10 mg total) by mouth daily before breakfast.   Polyethyl Glycol-Propyl Glycol (SYSTANE FREE OP) Place 1 drop into both eyes daily as needed (For dry eyes).   [DISCONTINUED] clopidogrel (PLAVIX) 75 MG tablet Take 1 tablet (75 mg total) by mouth daily with breakfast.     Allergies:   Losartan potassium, Olmesartan medoxomil, and Simvastatin   Social History   Socioeconomic History   Marital status: Single    Spouse name: Not on file   Number of children: 0   Years of education: Not on file   Highest education level: Not on file  Occupational History   Occupation: Retired  Tobacco Use   Smoking status: Former    Types: Cigarettes    Quit date: 03/20/2000    Years since quitting: 20.5   Smokeless tobacco: Never   Tobacco comments:    Pt states that she smoked off and on- "I don't know for how long"  Substance and Sexual Activity   Alcohol use: Not Currently    Alcohol/week: 0.0 standard drinks    Comment: occ   Drug use: No   Sexual activity: Not on file  Other Topics Concern   Not on file  Social History Narrative   Married   Social Determinants of Health   Financial Resource Strain: Low Risk    Difficulty of Paying Living Expenses: Not hard at all  Food Insecurity: No Food Insecurity   Worried About Charity fundraiser in the Last Year: Never true   Arboriculturist in the Last Year: Never true  Transportation Needs: No Transportation Needs   Lack of Transportation (Medical): No   Lack of Transportation (Non-Medical): No  Physical Activity: Not on file   Stress: Not on file  Social Connections: Not on file     Family History: The patient's family history includes Breast cancer in her paternal aunt; Cancer in her mother; Diabetes in her father. There is no history of Colon cancer.  ROS:   Please see the history of present illness.    All other systems reviewed and are negative.  EKGs/Labs/Other Studies Reviewed:    The following studies were reviewed today:  TAVR OPERATIVE NOTE     Date of Procedure:                08/31/2020   Preoperative Diagnosis:      Severe Aortic Stenosis   Postoperative Diagnosis:    Same   Procedure:  Transcatheter Aortic Valve Replacement - Percutaneous Transfemoral Approach             Edwards Sapien 3 Ultra THV (size 23 mm, model # 9750TFX, serial # [id number])              Co-Surgeons:                        Gaye Pollack, MD and Sherren Mocha, MD   Anesthesiologist:                  Duane Boston, MD   Echocardiographer:              [name], MD   Pre-operative Echo Findings: Critical aortic stenosis Severe global left ventricular systolic dysfunction   Post-operative Echo Findings: No paravalvular leak Unchanged left ventricular systolic function  _______________________  Echo 09/01/20 IMPRESSIONS  1. The aortic valve has been repaired/replaced. Well seated 23 mm Sapien  3 Valve with DVI 0.35, no paravalvular leak, and with appropriate  gradients and acceleration time. Aortic valve regurgitation is not  visualized. No aortic stenosis is present.  Aortic valve mean gradient measures 5.7 mmHg. Aortic valve Vmax measures  1.62 m/s. Aortic valve acceleration time measures 80 msec.   2. Left ventricular ejection fraction, by estimation, is 20 to 25%. Left  ventricular ejection fraction by 2D MOD biplane is 21.5 %. The left  ventricle has severely decreased function. The left ventricle demonstrates  global hypokinesis. Left ventricular  diastolic parameters are consistent  with Grade II diastolic dysfunction  (pseudonormalization).   3. Right ventricular systolic function is mildly reduced. The right  ventricular size is normal. Tricuspid regurgitation signal is inadequate  for assessing PA pressure.   4. Left atrial size was moderately dilated.   5. The mitral valve is abnormal. Trivial mitral valve regurgitation. No  evidence of mitral stenosis.   6. The inferior vena cava is normal in size with greater than 50%  respiratory variability, suggesting right atrial pressure of 3 mmHg.   Comparison(s): A prior study was performed on 08/31/20. Prior images  reviewed side by side. Stable TAVR positioning.   _____________________  Echo 10/07/20 IMPRESSIONS  1. S/P TAVR (Sapien 3, mean gradient 8 mmHg, peak velocity 2 m/s, DI 0.55; no AI); compared to 09/01/20, LV function has improved.  2. Left ventricular ejection fraction, by estimation, is 50 to 55%. The left ventricle has low normal function. The left ventricle has no regional wall motion abnormalities. Left ventricular diastolic parameters are consistent with Grade I diastolic dysfunction (impaired relaxation).  3. Right ventricular systolic function is normal. The right ventricular size is normal.  4. The mitral valve is normal in structure. No evidence of mitral valve regurgitation. No evidence of mitral stenosis.  5. The aortic valve has been repaired/replaced. Aortic valve regurgitation is not visualized. No aortic stenosis is present. There is a 23 mm Sapien prosthetic (TAVR) valve present in the aortic position. Procedure Date: 08/31/20.  6. The inferior vena cava is normal in size with greater than 50% respiratory variability, suggesting right atrial pressure of 3 mmHg.   Comparison(s): 09/01/20 EF 20-25%. AV 8mmHg mean PG, 80mmHg peak PG.   EKG:  EKG is NOT ordered today.   Recent Labs: 08/21/2020: ALT 50; B Natriuretic Peptide 2,105.0; TSH 2.383 09/01/2020: Magnesium 2.2 09/03/2020: BUN 11; Creatinine,  Ser 0.82; Hemoglobin 10.7; Platelets 174; Potassium 3.5; Sodium 135  Recent Lipid Panel    Component Value  Date/Time   CHOL 180 08/21/2020 1834   TRIG 90 08/21/2020 1834   TRIG 110 02/20/2006 0910   HDL 45 08/21/2020 1834   CHOLHDL 4.0 08/21/2020 1834   VLDL 18 08/21/2020 1834   LDLCALC 117 (H) 08/21/2020 1834   LDLDIRECT 184.7 11/27/2012 1026     Risk Assessment/Calculations:       Physical Exam:    VS:  BP 130/60   Pulse (!) 56   Ht 5' (1.524 m)   Wt 127 lb (57.6 kg)   BMI 24.80 kg/m     Wt Readings from Last 3 Encounters:  10/07/20 127 lb (57.6 kg)  09/07/20 127 lb 6.4 oz (57.8 kg)  09/03/20 131 lb 13.4 oz (59.8 kg)     GEN:  Well nourished, well developed in no acute distress HEENT: Normal NECK: No JVD;  LYMPHATICS: No lymphadenopathy CARDIAC: RRR, no murmurs, rubs, gallops RESPIRATORY:  Clear to auscultation without rales, wheezing or rhonchi  ABDOMEN: Soft, non-tender, non-distended MUSCULOSKELETAL:  No edema; No deformity  SKIN: Warm and dry NEUROLOGIC:  Alert and oriented x 3 PSYCHIATRIC:  Normal affect   ASSESSMENT:    1. S/P TAVR (transcatheter aortic valve replacement)   2. Chronic combined systolic and diastolic congestive heart failure (HCC)   3. Pulmonary mass   4. Abnormal findings on diagnostic imaging of lung    PLAN:    In order of problems listed above:  Severe AS s/p TAVR: echo today shows EF 50-55%, normally functioning TAVR with a mean gradient of 8 mm hg and no PVL. She is doing excellent with NYHA class I symptoms and feels "back to normal." Continue on aspirin and plavix. She can stop plavix 75mg  daily after 29months of therapy (discontinued 03/02/21). SBE prophylaxis discussed; I have RX'd amoxicillin. I will see her back in 1 year with an echo.   Chronic combined S/D CHF: EF improved to 50-55% today (up from 20-25%). GDMT limited by BP and HR. BP is improved today and she has had less orthostasis. I considered adding an ARB today  but she has allergies to ARBs and HR 54 precluding a BB. Continue Jardiance. Appears euvolemic.   Pulmonary mass: pre TAVR CT showed an irregular nodular and masslike focus of consolidation in the posterior right lower and posterior right upper lung lobes, stable in size and slightly more organized since 08/21/2020 chest CT, indeterminate for pneumonia versus neoplasm. Short-term follow-up chest CT recommended in 2-3 months, preferably after a trial of antibiotic therapy. She was treated with Cefdinir and doxy. Will check a follow up CT next month to ensure resolution.    Medication Adjustments/Labs and Tests Ordered: Current medicines are reviewed at length with the patient today.  Concerns regarding medicines are outlined above.  Orders Placed This Encounter  Procedures   CT Chest Wo Contrast    Meds ordered this encounter  Medications   clopidogrel (PLAVIX) 75 MG tablet    Sig: Take 1 tablet (75 mg total) by mouth daily with breakfast.    Dispense:  90 tablet    Refill:  1   amoxicillin (AMOXIL) 500 MG tablet    Sig: TAKE 4 TABLETS BY MOUTH 1 HOUR PRIOR TO ANY DENTAL PROCEDURES    Dispense:  12 tablet    Refill:  1     Patient Instructions  Medication Instructions:  Your physician recommends that you continue on your current medications as directed. BUT YOU ARE TO STOP THE PLAVIX ON 03/02/2021  *If you need  a refill on your cardiac medications before your next appointment, please call your pharmacy*   Lab Work: None ordered If you have labs (blood work) drawn today and your tests are completely normal, you will receive your results only by: [place] (if you have MyChart) OR A paper copy in the mail If you have any lab test that is abnormal or we need to change your treatment, we will call you to review the results.   Testing/Procedures: Your physician recommends you have a CT of the Chest on or after 10/26/2020.  Non-Cardiac CT scanning, (CAT scanning), is a  noninvasive, special x-ray that produces cross-sectional images of the body using x-rays and a computer. CT scans help physicians diagnose and treat medical conditions. For some CT exams, a contrast material is used to enhance visibility in the area of the body being studied. CT scans provide greater clarity and reveal more details than regular x-ray exams.   Follow-Up: At [facility], you and your health needs are our priority.  As part of our continuing mission to provide you with exceptional heart care, we have created designated Provider Care Teams.  These Care Teams include your primary Cardiologist (physician) and Advanced Practice Providers (APPs -  Physician Assistants and Nurse Practitioners) who all work together to provide you with the care you need, when you need it.  We recommend signing up for the patient portal called "MyChart".  Sign up information is provided on this After Visit Summary.  MyChart is used to connect with patients for Virtual Visits (Telemedicine).  Patients are able to view lab/test results, encounter notes, upcoming appointments, etc.  Non-urgent messages can be sent to your provider as well.   To learn more about what you can do with MyChart, go to [email or web address].    Your next appointment:   4 month(s)  The format for your next appointment:   In Person  Provider:   Oswaldo Milian, MD   Other Instructions    Signed, Angelena Form, PA-C  10/07/2020 4:48 PM    [place]

## 2020-10-20 ENCOUNTER — Other Ambulatory Visit: Payer: Self-pay

## 2020-10-20 ENCOUNTER — Ambulatory Visit (INDEPENDENT_AMBULATORY_CARE_PROVIDER_SITE_OTHER): Payer: Medicare Other

## 2020-10-20 VITALS — BP 118/80 | HR 88 | Temp 98.0°F | Ht 60.0 in | Wt 127.2 lb

## 2020-10-20 DIAGNOSIS — Z0000 Encounter for general adult medical examination without abnormal findings: Secondary | ICD-10-CM | POA: Diagnosis not present

## 2020-10-20 NOTE — Patient Instructions (Signed)
Alexandria Randolph , Thank you for taking time to come for your Medicare Wellness Visit. I appreciate your ongoing commitment to your health goals. Please review the following plan we discussed and let me know if I can assist you in the future.   Screening recommendations/referrals: Colonoscopy: Not a candidate for repeat screening due to age 86: last done 05/04/2020 Bone Density: last done 12/25/2017; results: osteopenia Recommended yearly ophthalmology/optometry visit for glaucoma screening and checkup Recommended yearly dental visit for hygiene and checkup  Vaccinations: Influenza vaccine: due Fall 2022 Pneumococcal vaccine: 09/18/2003, 12/05/2013 Tdap vaccine: 12/05/2013; due every 10 years (due 12/06/2023) Shingles vaccine: Please call your insurance company to determine your out of pocket expense for the Shingrix vaccine. You may receive this vaccine at your local pharmacy.   Covid-19: 05/16/2019, 05/22/2019, 03/08/2020  Advanced directives: Please bring a copy of your health care power of attorney and living will to the office at your convenience.  Conditions/risks identified: Yes; Client understands the importance of follow-up with providers by attending scheduled visits and discussed goals to eat healthier, increase physical activity, exercise the brain, socialize more, get enough sleep and make time for laughter.  Next appointment: Please schedule your next Medicare Wellness Visit with your Nurse Health Advisor in 1 year by calling [phone].   Preventive Care 78 Years and Older, Female Preventive care refers to lifestyle choices and visits with your health care provider that can promote health and wellness. What does preventive care include? A yearly physical exam. This is also called an annual well check. Dental exams once or twice a year. Routine eye exams. Ask your health care provider how often you should have your eyes checked. Personal lifestyle choices, including: Daily care  of your teeth and gums. Regular physical activity. Eating a healthy diet. Avoiding tobacco and drug use. Limiting alcohol use. Practicing safe sex. Taking low-dose aspirin every day. Taking vitamin and mineral supplements as recommended by your health care provider. What happens during an annual well check? The services and screenings done by your health care provider during your annual well check will depend on your age, overall health, lifestyle risk factors, and family history of disease. Counseling  Your health care provider may ask you questions about your: Alcohol use. Tobacco use. Drug use. Emotional well-being. Home and relationship well-being. Sexual activity. Eating habits. History of falls. Memory and ability to understand (cognition). Work and work Statistician. Reproductive health. Screening  You may have the following tests or measurements: Height, weight, and BMI. Blood pressure. Lipid and cholesterol levels. These may be checked every 5 years, or more frequently if you are over 44 years old. Skin check. Lung cancer screening. You may have this screening every year starting at age 64 if you have a 30-pack-year history of smoking and currently smoke or have quit within the past 15 years. Fecal occult blood test (FOBT) of the stool. You may have this test every year starting at age 70. Flexible sigmoidoscopy or colonoscopy. You may have a sigmoidoscopy every 5 years or a colonoscopy every 10 years starting at age 69. Hepatitis C blood test. Hepatitis B blood test. Sexually transmitted disease (STD) testing. Diabetes screening. This is done by checking your blood sugar (glucose) after you have not eaten for a while (fasting). You may have this done every 1-3 years. Bone density scan. This is done to screen for osteoporosis. You may have this done starting at age 70. Mammogram. This may be done every 1-2 years. Talk to your health  care provider about how often you  should have regular mammograms. Talk with your health care provider about your test results, treatment options, and if necessary, the need for more tests. Vaccines  Your health care provider may recommend certain vaccines, such as: Influenza vaccine. This is recommended every year. Tetanus, diphtheria, and acellular pertussis (Tdap, Td) vaccine. You may need a Td booster every 10 years. Zoster vaccine. You may need this after age 2. Pneumococcal 13-valent conjugate (PCV13) vaccine. One dose is recommended after age 40. Pneumococcal polysaccharide (PPSV23) vaccine. One dose is recommended after age 75. Talk to your health care provider about which screenings and vaccines you need and how often you need them. This information is not intended to replace advice given to you by your health care provider. Make sure you discuss any questions you have with your health care provider. Document Released: 04/02/2015 Document Revised: 11/24/2015 Document Reviewed: 01/05/2015 Elsevier Interactive Patient Education  2017 [place] Prevention in the Home Falls can cause injuries. They can happen to people of all ages. There are many things you can do to make your home safe and to help prevent falls. What can I do on the outside of my home? Regularly fix the edges of walkways and driveways and fix any cracks. Remove anything that might make you trip as you walk through a door, such as a raised step or threshold. Trim any bushes or trees on the path to your home. Use bright outdoor lighting. Clear any walking paths of anything that might make someone trip, such as rocks or tools. Regularly check to see if handrails are loose or broken. Make sure that both sides of any steps have handrails. Any raised decks and porches should have guardrails on the edges. Have any leaves, snow, or ice cleared regularly. Use sand or salt on walking paths during winter. Clean up any spills in your garage right away.  This includes oil or grease spills. What can I do in the bathroom? Use night lights. Install grab bars by the toilet and in the tub and shower. Do not use towel bars as grab bars. Use non-skid mats or decals in the tub or shower. If you need to sit down in the shower, use a plastic, non-slip stool. Keep the floor dry. Clean up any water that spills on the floor as soon as it happens. Remove soap buildup in the tub or shower regularly. Attach bath mats securely with double-sided non-slip rug tape. Do not have throw rugs and other things on the floor that can make you trip. What can I do in the bedroom? Use night lights. Make sure that you have a light by your bed that is easy to reach. Do not use any sheets or blankets that are too big for your bed. They should not hang down onto the floor. Have a firm chair that has side arms. You can use this for support while you get dressed. Do not have throw rugs and other things on the floor that can make you trip. What can I do in the kitchen? Clean up any spills right away. Avoid walking on wet floors. Keep items that you use a lot in easy-to-reach places. If you need to reach something above you, use a strong step stool that has a grab bar. Keep electrical cords out of the way. Do not use floor polish or wax that makes floors slippery. If you must use wax, use non-skid floor wax. Do not have throw  rugs and other things on the floor that can make you trip. What can I do with my stairs? Do not leave any items on the stairs. Make sure that there are handrails on both sides of the stairs and use them. Fix handrails that are broken or loose. Make sure that handrails are as long as the stairways. Check any carpeting to make sure that it is firmly attached to the stairs. Fix any carpet that is loose or worn. Avoid having throw rugs at the top or bottom of the stairs. If you do have throw rugs, attach them to the floor with carpet tape. Make sure that  you have a light switch at the top of the stairs and the bottom of the stairs. If you do not have them, ask someone to add them for you. What else can I do to help prevent falls? Wear shoes that: Do not have high heels. Have rubber bottoms. Are comfortable and fit you well. Are closed at the toe. Do not wear sandals. If you use a stepladder: Make sure that it is fully opened. Do not climb a closed stepladder. Make sure that both sides of the stepladder are locked into place. Ask someone to hold it for you, if possible. Clearly mark and make sure that you can see: Any grab bars or handrails. First and last steps. Where the edge of each step is. Use tools that help you move around (mobility aids) if they are needed. These include: Canes. Walkers. Scooters. Crutches. Turn on the lights when you go into a dark area. Replace any light bulbs as soon as they burn out. Set up your furniture so you have a clear path. Avoid moving your furniture around. If any of your floors are uneven, fix them. If there are any pets around you, be aware of where they are. Review your medicines with your doctor. Some medicines can make you feel dizzy. This can increase your chance of falling. Ask your doctor what other things that you can do to help prevent falls. This information is not intended to replace advice given to you by your health care provider. Make sure you discuss any questions you have with your health care provider. Document Released: 12/31/2008 Document Revised: 08/12/2015 Document Reviewed: 04/10/2014 Elsevier Interactive Patient Education  2017 [employer].

## 2020-10-20 NOTE — Progress Notes (Signed)
Subjective:   Alexandria Randolph is a 86 y.o. female who presents for Medicare Annual (Subsequent) preventive examination.  Review of Systems     Cardiac Risk Factors include: advanced age (>66mn, >>60women);dyslipidemia;family history of premature cardiovascular disease;hypertension     Objective:    Today's Vitals   10/20/20 1021  BP: 118/80  Pulse: 88  Temp: 98 F (36.7 C)  SpO2: 98%  Weight: 127 lb 3.2 oz (57.7 kg)  Height: 5' (1.524 m)  PainSc: 0-No pain   Body mass index is 24.84 kg/m.  Advanced Directives 10/20/2020 08/22/2020 08/21/2020 01/19/2017 01/15/2017 01/02/2017 02/03/2015  Does Patient Have a Medical Advance Directive? Yes No No Yes Yes No Yes  Type of Advance Directive Living will;Healthcare Power of Attorney - - - Living will - Living will;Healthcare Power of Attorney  Does patient want to make changes to medical advance directive? No - Patient declined - - No - Patient declined - - No - Patient declined  Copy of HElderonin Chart? No - copy requested - - - - - No - copy requested  Would patient like information on creating a medical advance directive? - No - Patient declined No - Patient declined - - - -    Current Medications (verified) Outpatient Encounter Medications as of 10/20/2020  Medication Sig   amoxicillin (AMOXIL) 500 MG tablet TAKE 4 TABLETS BY MOUTH 1 HOUR PRIOR TO ANY DENTAL PROCEDURES   aspirin 81 MG chewable tablet Chew 1 tablet (81 mg total) by mouth daily.   clopidogrel (PLAVIX) 75 MG tablet Take 1 tablet (75 mg total) by mouth daily with breakfast.   empagliflozin (JARDIANCE) 10 MG TABS tablet Take 1 tablet (10 mg total) by mouth daily before breakfast.   fexofenadine (ALLEGRA) 180 MG tablet Take 180 mg by mouth daily.   letrozole (FEMARA) 2.5 MG tablet Take 1 tablet (2.5 mg total) by mouth daily.   Polyethyl Glycol-Propyl Glycol (SYSTANE FREE OP) Place 1 drop into both eyes daily as needed (For dry eyes). (Patient not  taking: Reported on 10/20/2020)   No facility-administered encounter medications on file as of 10/20/2020.    Allergies (verified) Losartan potassium, Olmesartan medoxomil, and Simvastatin   History: Past Medical History:  Diagnosis Date   Allergy    SEASONAL   Cataracts, bilateral    DYSLIPIDEMIA 12/01/2006   Environmental allergies    has had allergy test:grass,trees,plants,dust   HYPERGLYCEMIA 12/01/2006   HYPERTENSION 12/01/2006   OSTEOPOROSIS 12/01/2006   S/P TAVR (transcatheter aortic valve replacement) 08/31/2020   s/p TAVR with 23 mm Edwards S3U via the TF approach by Dr. BCyndia Bentand Dr. CBurt Knack   Severe aortic stenosis    SMOKER 12/01/2006   Past Surgical History:  Procedure Laterality Date   ABDOMINAL HYSTERECTOMY     BREAST LUMPECTOMY WITH RADIOACTIVE SEED LOCALIZATION Right 01/19/2017   Procedure: RIGHT BREAST LUMPECTOMY WITH RADIOACTIVE SEED LOCALIZATION;  Surgeon: IFanny Skates MD;  [facility]  Service: General;  Laterality: Right;   COLONOSCOPY     INTRAOPERATIVE TRANSTHORACIC ECHOCARDIOGRAM Left 08/31/2020   Procedure: INTRAOPERATIVE TRANSTHORACIC ECHOCARDIOGRAM;  Surgeon: CSherren Mocha MD;  [facility]  Service: Open Heart Surgery;  Laterality: Left;   POLYPECTOMY     RIGHT/LEFT HEART CATH AND CORONARY ANGIOGRAPHY N/A 08/23/2020   Procedure: RIGHT/LEFT HEART CATH AND CORONARY ANGIOGRAPHY;  Surgeon: BLorretta Harp MD;  [facility];  Service: Cardiovascular;  Laterality: N/A;   TOTAL ABDOMINAL HYSTERECTOMY  W/ BILATERAL SALPINGOOPHORECTOMY  1980   TRANSCATHETER AORTIC VALVE REPLACEMENT, TRANSFEMORAL N/A 08/31/2020   Procedure: TRANSCATHETER AORTIC VALVE REPLACEMENT, TRANSFEMORAL;  Surgeon: Sherren Mocha, MD;  Location: [place] [facility];  Service: Open Heart Surgery;  Laterality: N/A;   ULTRASOUND GUIDANCE FOR VASCULAR ACCESS Bilateral 08/31/2020   Procedure: ULTRASOUND GUIDANCE FOR VASCULAR ACCESS;  Surgeon: Sherren Mocha,  MD;  Location: [facility];  Service: Open Heart Surgery;  Laterality: Bilateral;   Family History  Problem Relation Age of Onset   Cancer Mother        "Throat" and colon cancer, both at an advanced age   Diabetes Father    Breast cancer Paternal Aunt    Colon cancer Neg Hx    Social History   Socioeconomic History   Marital status: Single    Spouse name: Not on file   Number of children: 0   Years of education: Not on file   Highest education level: Not on file  Occupational History   Occupation: Retired  Tobacco Use   Smoking status: Former    Types: Cigarettes    Quit date: 03/20/2000    Years since quitting: 20.6   Smokeless tobacco: Never   Tobacco comments:    Pt states that she smoked off and on- "I don't know for how long"  Substance and Sexual Activity   Alcohol use: Not Currently    Alcohol/week: 0.0 standard drinks    Comment: occ   Drug use: No   Sexual activity: Not on file  Other Topics Concern   Not on file  Social History Narrative   Married   Social Determinants of Health   Financial Resource Strain: Low Risk    Difficulty of Paying Living Expenses: Not hard at all  Food Insecurity: No Food Insecurity   Worried About Charity fundraiser in the Last Year: Never true   Arboriculturist in the Last Year: Never true  Transportation Needs: No Transportation Needs   Lack of Transportation (Medical): No   Lack of Transportation (Non-Medical): No  Physical Activity: Sufficiently Active   Days of Exercise per Week: 5 days   Minutes of Exercise per Session: 30 min  Stress: No Stress Concern Present   Feeling of Stress : Not at all  Social Connections: Moderately Integrated   Frequency of Communication with Friends and Family: More than three times a week   Frequency of Social Gatherings with Friends and Family: Once a week   Attends Religious Services: 1 to 4 times per year   Active Member of [employer] or Organizations: No   Attends Music therapist:  1 to 4 times per year   Marital Status: Divorced    Tobacco Counseling Counseling given: Not Answered Tobacco comments: Pt states that she smoked off and on- "I don't know for how long"   Clinical Intake:  Pre-visit preparation completed: Yes  Pain : No/denies pain Pain Score: 0-No pain     BMI - recorded: 24.84 Nutritional Status: BMI of 19-24  Normal Nutritional Risks: None Diabetes: No  How often do you need to have someone help you when you read instructions, pamphlets, or other written materials from your doctor or pharmacy?: 1 - Never What is the last grade level you completed in school?: [employer]; [employer] (1 year)  Diabetic? no  Interpreter Needed?: No  Information entered by :: Lisette Abu, LPn   Activities of Daily Living In your present state of health, do you have any  difficulty performing the following activities: 10/20/2020 08/22/2020  Hearing? N N  Vision? N N  Difficulty concentrating or making decisions? N N  Walking or climbing stairs? N N  Dressing or bathing? N N  Doing errands, shopping? N -  Preparing Food and eating ? N -  Using the Toilet? N -  In the past six months, have you accidently leaked urine? N -  Do you have problems with loss of bowel control? N -  Managing your Medications? N -  Managing your Finances? N -  Housekeeping or managing your Housekeeping? N -  Some recent data might be hidden    Patient Care Team: Marrian Salvage, [place] as PCP - General (Internal Medicine) Donato Heinz, MD as PCP - Cardiology (Cardiology)  Indicate any recent Medical Services you may have received from other than [facility] providers in the past year (date may be approximate).     Assessment:   This is a routine wellness examination for Donette.  Hearing/Vision screen No results found.  Dietary issues and exercise activities discussed: Current Exercise Habits: Home exercise routine, Type of exercise: walking, Time  (Minutes): 30, Frequency (Times/Week): 5, Weekly Exercise (Minutes/Week): 150, Intensity: Mild, Exercise limited by: None identified   Goals Addressed   None   Depression Screen PHQ 2/9 Scores 10/20/2020 05/11/2020 04/03/2018  PHQ - 2 Score 0 0 0    Fall Risk Fall Risk  10/20/2020  Falls in the past year? 0  Number falls in past yr: 0  Injury with Fall? 0  Risk for fall due to : No Fall Risks  Follow up Falls evaluation completed    [place]:  Any stairs in or around the home? No  If so, are there any without handrails? No  Home free of loose throw rugs in walkways, pet beds, electrical cords, etc? Yes  Adequate lighting in your home to reduce risk of falls? Yes   ASSISTIVE DEVICES UTILIZED TO PREVENT FALLS:  Life alert? No  Use of a cane, walker or w/c? No  Grab bars in the bathroom? No  Shower chair or bench in shower? No  Elevated toilet seat or a handicapped toilet? Yes   TIMED UP AND GO:  Was the test performed? Yes .  Length of time to ambulate 10 feet: 6 sec.   Gait steady and fast without use of assistive device  Cognitive Function: Normal cognitive status assessed by direct observation by this Nurse Health Advisor. No abnormalities found.          Immunizations Immunization History  Administered Date(s) Administered   Influenza, High Dose Seasonal PF 01/02/2017, 04/03/2018   Influenza,inj,Quad PF,6+ Mos 12/05/2013, 01/07/2015   PFIZER(Purple Top)SARS-COV-2 Vaccination 05/16/2019, 06/11/2019, 03/08/2020   Pneumococcal Conjugate-13 12/05/2013   Pneumococcal Polysaccharide-23 09/18/2003   Td 08/19/2002   Tdap 12/05/2013   Zoster, Live 11/14/2011    TDAP status: Up to date  Flu Vaccine status: Up to date  Pneumococcal vaccine status: Up to date  Covid-19 vaccine status: Completed vaccines  Qualifies for Shingles Vaccine? Yes   Zostavax completed Yes   Shingrix Completed?: No.    Education has been provided  regarding the importance of this vaccine. Patient has been advised to call insurance company to determine out of pocket expense if they have not yet received this vaccine. Advised may also receive vaccine at local pharmacy or Health Dept. Verbalized acceptance and understanding.  Screening Tests Health Maintenance  Topic Date Due  Zoster Vaccines- Shingrix (1 of 2) Never done   COLONOSCOPY (Pts 45-59yr Insurance coverage will need to be confirmed)  02/15/2018   COVID-19 Vaccine (4 - Booster for Pfizer series) 06/06/2020   INFLUENZA VACCINE  10/18/2020   MAMMOGRAM  05/04/2021   TETANUS/TDAP  12/06/2023   DEXA SCAN  Completed   PNA vac Low Risk Adult  Completed   HPV VACCINES  Aged Out    Health Maintenance  Health Maintenance Due  Topic Date Due   Zoster Vaccines- Shingrix (1 of 2) Never done   COLONOSCOPY (Pts 45-437yrInsurance coverage will need to be confirmed)  02/15/2018   COVID-19 Vaccine (4 - Booster for Pfizer series) 06/06/2020   INFLUENZA VACCINE  10/18/2020    Colorectal cancer screening: No longer required.   Mammogram status: Completed 05/04/2020. Repeat every year  Bone Density status: Completed 12/25/2017. Results reflect: Bone density results: OSTEOPENIA. Repeat every 2 years.  Lung Cancer Screening: (Low Dose CT Chest recommended if Age 84-80ears, 30 pack-year currently smoking OR have quit w/in 15years.) does not qualify.   Lung Cancer Screening Referral: no  Additional Screening:  Hepatitis C Screening: does not qualify; Completed no  Vision Screening: Recommended annual ophthalmology exams for early detection of glaucoma and other disorders of the eye. Is the patient up to date with their annual eye exam?  Yes  Who is the provider or what is the name of the office in which the patient attends annual [facility] exams[facility]  If pt is not established with a provider, would they like to be referred to a provider to establish care? No .    Dental Screening: Recommended annual dental exams for proper oral hygiene  Community Resource Referral / Chronic Care Management: CRR required this visit?  No   CCM required this visit?  No      Plan:     I have personally reviewed and noted the following in the patient's chart:   Medical and social history Use of alcohol, tobacco or illicit drugs  Current medications and supplements including opioid prescriptions.  Functional ability and status Nutritional status Physical activity Advanced directives List of other physicians Hospitalizations, surgeries, and ER visits in previous 12 months Vitals Screenings to include cognitive, depression, and falls Referrals and appointments  In addition, I have reviewed and discussed with patient certain preventive protocols, quality metrics, and best practice recommendations. A written personalized care plan for preventive services as well as general preventive health recommendations were provided to patient.     ShSheral FlowLPN   [id number] Nurse Notes: n/a

## 2020-10-26 ENCOUNTER — Ambulatory Visit (HOSPITAL_COMMUNITY)
Admission: RE | Admit: 2020-10-26 | Discharge: 2020-10-26 | Disposition: A | Discharge status: Home / Self Care | Payer: Medicare Other | Source: Ambulatory Visit | Attending: Physician Assistant | Admitting: Physician Assistant

## 2020-10-26 ENCOUNTER — Other Ambulatory Visit: Payer: Self-pay

## 2020-10-26 ENCOUNTER — Encounter (HOSPITAL_COMMUNITY): Payer: Self-pay

## 2020-10-26 DIAGNOSIS — R918 Other nonspecific abnormal finding of lung field: Secondary | ICD-10-CM | POA: Insufficient documentation

## 2020-10-26 DIAGNOSIS — R911 Solitary pulmonary nodule: Secondary | ICD-10-CM | POA: Diagnosis not present

## 2020-10-26 DIAGNOSIS — I700 Atherosclerosis of aorta: Secondary | ICD-10-CM | POA: Diagnosis not present

## 2020-10-31 ENCOUNTER — Other Ambulatory Visit: Payer: Self-pay | Admitting: Physician Assistant

## 2020-11-15 ENCOUNTER — Other Ambulatory Visit (HOSPITAL_COMMUNITY): Payer: Self-pay

## 2020-12-01 ENCOUNTER — Other Ambulatory Visit: Payer: Self-pay | Admitting: Physician Assistant

## 2020-12-02 ENCOUNTER — Ambulatory Visit: Payer: Medicare Other | Admitting: Internal Medicine

## 2020-12-07 ENCOUNTER — Other Ambulatory Visit: Payer: Self-pay | Admitting: Physician Assistant

## 2021-01-17 ENCOUNTER — Other Ambulatory Visit (HOSPITAL_COMMUNITY): Payer: Self-pay

## 2021-02-15 NOTE — Progress Notes (Deleted)
Cardiology Office Note:    Date:  02/15/2021   ID:  Candie Echevaria, DOB [birth year], MRN [id number]  PCP:  Hoyt Koch, MD  Cardiologist:  Donato Heinz, MD  Electrophysiologist:  None   Referring MD: Marrian Salvage,*   No chief complaint on file. ***  History of Present Illness:    Alexandria Randolph is a 86 y.o. female with a hx of severe aortic stenosis status post TAVR 08/31/2020, chronic combined systolic and diastolic heart failure, breast cancer, hypertension, hyperlipidemia, who presents for follow-up.  She was admitted to [facility] 08/2020 with worsening dyspnea.  Echo showed EF 20 to 25%, severe aortic stenosis with mean gradient over 40 mmHg despite low EF.  LHC/RHC showed 30% ostial left main disease, critical AS with peak gradient 72 mmHg.  She underwent TAVR with 23 mm Edwards SAPIEN 3 valve during hospitalization.  Echocardiogram on 10/07/2020 showed EF 50 to 64%, grade 1 diastolic dysfunction, normal RV function, status post TAVR with mean gradient 8 mmHg, no PVL.  Past Medical History:  Diagnosis Date   Allergy    SEASONAL   Cataracts, bilateral    DYSLIPIDEMIA 12/01/2006   Environmental allergies    has had allergy test:grass,trees,plants,dust   HYPERGLYCEMIA 12/01/2006   HYPERTENSION 12/01/2006   OSTEOPOROSIS 12/01/2006   S/P TAVR (transcatheter aortic valve replacement) 08/31/2020   s/p TAVR with 23 mm Edwards S3U via the TF approach by Dr. Cyndia Bent and Dr. Burt Knack.   Severe aortic stenosis    SMOKER 12/01/2006    Past Surgical History:  Procedure Laterality Date   ABDOMINAL HYSTERECTOMY     BREAST LUMPECTOMY WITH RADIOACTIVE SEED LOCALIZATION Right 01/19/2017   Procedure: RIGHT BREAST LUMPECTOMY WITH RADIOACTIVE SEED LOCALIZATION;  Surgeon: Fanny Skates, MD;  Location: [place];  Service: General;  Laterality: Right;   COLONOSCOPY     INTRAOPERATIVE TRANSTHORACIC ECHOCARDIOGRAM Left 08/31/2020   Procedure: INTRAOPERATIVE  TRANSTHORACIC ECHOCARDIOGRAM;  Surgeon: Sherren Mocha, MD;  Location: [place];  Service: Open Heart Surgery;  Laterality: Left;   POLYPECTOMY     RIGHT/LEFT HEART CATH AND CORONARY ANGIOGRAPHY N/A 08/23/2020   Procedure: RIGHT/LEFT HEART CATH AND CORONARY ANGIOGRAPHY;  Surgeon: Lorretta Harp, MD;  Location: [place] [facility];  Service: Cardiovascular;  Laterality: N/A;   TOTAL ABDOMINAL HYSTERECTOMY W/ BILATERAL SALPINGOOPHORECTOMY  1980   TRANSCATHETER AORTIC VALVE REPLACEMENT, TRANSFEMORAL N/A 08/31/2020   Procedure: TRANSCATHETER AORTIC VALVE REPLACEMENT, TRANSFEMORAL;  Surgeon: Sherren Mocha, MD;  Location: [place];  Service: Open Heart Surgery;  Laterality: N/A;   ULTRASOUND GUIDANCE FOR VASCULAR ACCESS Bilateral 08/31/2020   Procedure: ULTRASOUND GUIDANCE FOR VASCULAR ACCESS;  Surgeon: Sherren Mocha, MD;  Location: [place];  Service: Open Heart Surgery;  Laterality: Bilateral;    Current Medications: No outpatient medications have been marked as taking for the 02/16/21 encounter (Appointment) with Donato Heinz, MD.     Allergies:   Losartan potassium, Olmesartan medoxomil, and Simvastatin   Social History   Socioeconomic History   Marital status: Single    Spouse name: Not on file   Number of children: 0   Years of education: Not on file   Highest education level: Not on file  Occupational History   Occupation: Retired  Tobacco Use   Smoking status: Former    Types: Cigarettes    Quit date: 03/20/2000    Years since quitting: 20.9   Smokeless tobacco: Never   Tobacco comments:    Pt states that she smoked  off and on- "I don't know for how long"  Substance and Sexual Activity   Alcohol use: Not Currently    Alcohol/week: 0.0 standard drinks    Comment: occ   Drug use: No   Sexual activity: Not on file  Other Topics Concern   Not on file  Social History Narrative   Married   Social Determinants of Health   Financial Resource Strain: Low Risk     Difficulty of Paying Living Expenses: Not hard at all  Food Insecurity: No Food Insecurity   Worried About Charity fundraiser in the Last Year: Never true   Arboriculturist in the Last Year: Never true  Transportation Needs: No Transportation Needs   Lack of Transportation (Medical): No   Lack of Transportation (Non-Medical): No  Physical Activity: Sufficiently Active   Days of Exercise per Week: 5 days   Minutes of Exercise per Session: 30 min  Stress: No Stress Concern Present   Feeling of Stress : Not at all  Social Connections: Moderately Integrated   Frequency of Communication with Friends and Family: More than three times a week   Frequency of Social Gatherings with Friends and Family: Once a week   Attends Religious Services: 1 to 4 times per year   Active Member of [employer] or Organizations: No   Attends Music therapist: 1 to 4 times per year   Marital Status: Divorced     Family History: The patient's ***family history includes Breast cancer in her paternal aunt; Cancer in her mother; Diabetes in her father. There is no history of Colon cancer.  ROS:   Please see the history of present illness.    *** All other systems reviewed and are negative.  EKGs/Labs/Other Studies Reviewed:    The following studies were reviewed today: ***  EKG:  EKG is *** ordered today.  The ekg ordered today demonstrates ***  Recent Labs: 08/21/2020: ALT 50; B Natriuretic Peptide 2,105.0; TSH 2.383 09/01/2020: Magnesium 2.2 09/03/2020: BUN 11; Creatinine, Ser 0.82; Hemoglobin 10.7; Platelets 174; Potassium 3.5; Sodium 135  Recent Lipid Panel    Component Value Date/Time   CHOL 180 08/21/2020 1834   TRIG 90 08/21/2020 1834   TRIG 110 02/20/2006 0910   HDL 45 08/21/2020 1834   CHOLHDL 4.0 08/21/2020 1834   VLDL 18 08/21/2020 1834   LDLCALC 117 (H) 08/21/2020 1834   LDLDIRECT 184.7 11/27/2012 1026    Physical Exam:    VS:  There were no vitals taken for this visit.    Wt  Readings from Last 3 Encounters:  10/20/20 127 lb 3.2 oz (57.7 kg)  10/07/20 127 lb (57.6 kg)  09/07/20 127 lb 6.4 oz (57.8 kg)     GEN: *** Well nourished, well developed in no acute distress HEENT: Normal NECK: No JVD; No carotid bruits LYMPHATICS: No lymphadenopathy CARDIAC: ***RRR, no murmurs, rubs, gallops RESPIRATORY:  Clear to auscultation without rales, wheezing or rhonchi  ABDOMEN: Soft, non-tender, non-distended MUSCULOSKELETAL:  No edema; No deformity  SKIN: Warm and dry NEUROLOGIC:  Alert and oriented x 3 PSYCHIATRIC:  Normal affect   ASSESSMENT:    No diagnosis found. PLAN:     Severe aortic stenosis status post TAVR 08/2020:  Echocardiogram on 10/07/2020 showed EF 50 to 95%, grade 1 diastolic dysfunction, normal RV function, status post TAVR with mean gradient 8 mmHg, no PVL. -Continue aspirin.  Continue Plavix x6 months  Chronic combined systolic and diastolic heart failure: EF 20  to 25% 08/2020.  Follow-up echo 09/2020 showed EF improved to 50 to 55% following TAVR.  Continue Jardiance   RTC in***   Medication Adjustments/Labs and Tests Ordered: Current medicines are reviewed at length with the patient today.  Concerns regarding medicines are outlined above.  No orders of the defined types were placed in this encounter.  No orders of the defined types were placed in this encounter.   There are no Patient Instructions on file for this visit.   Signed, Donato Heinz, MD  02/15/2021 10:58 PM    [place]

## 2021-02-16 ENCOUNTER — Ambulatory Visit: Payer: Medicare Other | Admitting: Cardiology

## 2021-05-01 ENCOUNTER — Other Ambulatory Visit: Payer: Self-pay | Admitting: Physician Assistant

## 2021-05-17 ENCOUNTER — Other Ambulatory Visit: Payer: Self-pay | Admitting: Physician Assistant

## 2021-05-17 DIAGNOSIS — Z952 Presence of prosthetic heart valve: Secondary | ICD-10-CM

## 2021-05-31 NOTE — Progress Notes (Incomplete)
? ?Patient Care Team: ?Hoyt Koch, MD as PCP - General (Internal Medicine) ?Donato Heinz, MD as PCP - Cardiology (Cardiology) ? ?DIAGNOSIS: No diagnosis found. ? ?SUMMARY OF ONCOLOGIC HISTORY: ?Oncology History  ?Malignant neoplasm of upper-outer quadrant of right breast in female, estrogen receptor positive (Alexandria Randolph)  ?12/27/2016 Initial Diagnosis  ? Screening detected right breast mass 7 mm at 9o clock position, axilla negative, biopsy: IDC grade 2 with DCIS ER 100%, PR 100%, Ki-67 15%,HER-2 negative ratio 1.48 T1b N0 stage IA clinical stage ?  ?01/19/2017 Surgery  ? Right lumpectomy: IDC grade 2, 1 cm, with high-grade DCIS, margins negative, ER 100%, PR 100%, HER-2 negative, Ki-67 15%, T1BNX stage I a ?  ?02/2017 -  Anti-estrogen oral therapy  ? Anastrozole daily ?  ? ? ?CHIEF COMPLIANT: Follow-up of right breast cancer on anastrozole therapy ? ?INTERVAL HISTORY: Alexandria Randolph is a 87 y.o. with above-mentioned history of right breast cancer treated with lumpectomy and who is on anti-estrogen therapy with anastrozole. She presents to the clinic today for annual follow-up. ? ? ?ALLERGIES:  is allergic to losartan potassium, olmesartan medoxomil, and simvastatin. ? ?MEDICATIONS:  ?Current Outpatient Medications  ?Medication Sig Dispense Refill  ? amoxicillin (AMOXIL) 500 MG tablet TAKE 4 TABLETS BY MOUTH 1 HOUR PRIOR TO ANY DENTAL PROCEDURES 12 tablet 1  ? aspirin 81 MG chewable tablet CHEW 1 TABLET BY MOUTH EVERY DAY 36 tablet 3  ? clopidogrel (PLAVIX) 75 MG tablet TAKE 1 TABLET BY MOUTH DAILY WITH BREAKFAST. 90 tablet 1  ? fexofenadine (ALLEGRA) 180 MG tablet Take 180 mg by mouth daily.    ? JARDIANCE 10 MG TABS tablet TAKE 1 TABLET BY MOUTH EVERY DAY BEFORE BREAKFAST 90 tablet 3  ? letrozole (FEMARA) 2.5 MG tablet Take 1 tablet (2.5 mg total) by mouth daily. 90 tablet 3  ? Polyethyl Glycol-Propyl Glycol (SYSTANE FREE OP) Place 1 drop into both eyes daily as needed (For dry eyes). (Patient  not taking: Reported on 10/20/2020)    ? ?No current facility-administered medications for this visit.  ? ? ?PHYSICAL EXAMINATION: ?ECOG PERFORMANCE STATUS: {CHL ONC ECOG AY:[id number]} ? ?There were no vitals filed for this visit. ?There were no vitals filed for this visit. ? ?BREAST:*** No palpable masses or nodules in either right or left breasts. No palpable axillary supraclavicular or infraclavicular adenopathy no breast tenderness or nipple discharge. (exam performed in the presence of a chaperone) ? ?LABORATORY DATA:  ?I have reviewed the data as listed ?CMP Latest Ref Rng & Units 09/03/2020 09/02/2020 09/01/2020  ?Glucose 70 - 99 mg/dL 83 92 113(H)  ?BUN 8 - 23 mg/dL 11 14 14   ?Creatinine 0.44 - 1.00 mg/dL 0.82 0.98 0.83  ?Sodium 135 - 145 mmol/L 135 135 137  ?Potassium 3.5 - 5.1 mmol/L 3.5 3.8 4.1  ?Chloride 98 - 111 mmol/L 105 104 108  ?CO2 22 - 32 mmol/L 23 23 20(L)  ?Calcium 8.9 - 10.3 mg/dL 8.4(L) 8.5(L) 8.4(L)  ?Total Protein 6.5 - 8.1 g/dL - - -  ?Total Bilirubin 0.3 - 1.2 mg/dL - - -  ?Alkaline Phos 38 - 126 U/L - - -  ?AST 15 - 41 U/L - - -  ?ALT 0 - 44 U/L - - -  ? ? ?Lab Results  ?Component Value Date  ? WBC 6.2 09/03/2020  ? HGB 10.7 (L) 09/03/2020  ? HCT 32.9 (L) 09/03/2020  ? MCV 89.6 09/03/2020  ? PLT 174 09/03/2020  ? NEUTROABS 4.2 08/21/2020  ? ? ?  ASSESSMENT & PLAN:  ?No problem-specific Assessment & Plan notes found for this encounter. ? ? ? ?No orders of the defined types were placed in this encounter. ? ?The patient has a good understanding of the overall plan. she agrees with it. she will call with any problems that may develop before the next visit here. ?Total time spent: 30 mins including face to face time and time spent for planning, charting and co-ordination of care ? ? Suzzette Righter, CMA ?05/31/21 ? ? ? I, Tylisa Alcivar Lannie Fields, am acting as a scribe for Dr. Lindi Adie  ?

## 2021-06-02 ENCOUNTER — Inpatient Hospital Stay: Payer: Medicare Other | Attending: Hematology and Oncology | Admitting: Hematology and Oncology

## 2021-06-02 NOTE — Assessment & Plan Note (Deleted)
01/19/2017 right lumpectomy: IDC grade 2, 1 cm, with high-grade DCIS, margins negative, ER 100%, PR 100%, HER-2 negative, Ki-67 15%, T1BNX stage I a ?? ?Recommendation: ?1.??Radiation oncology determined that she does not need adjuvant radiation?primarily because of her elderly age. ?2. Adjuvant antiestrogen therapy with anastrozole 1 mg daily?X?5 years?started?December 2018 ?? ?Anastrozole toxicities:? ?Patient is experiencing decreased appetite and attributes this to anastrozole therapy. ? ? ?Breast cancer surveillance: ?1.?Breast exam:?06/02/2021: Benign ?2.?mammogram:?at [facility]?05/04/20: Benign breast density category B, patient needs another mammogram ?3.?Bone density test?12/25/2017: T score -1.5?mild osteopenia  ?CT chest 10/26/2020: Resolution of previously seen airspace opacities, diffuse bronchial wall thickening consistent with inflammatory bronchitis ? ?Return to clinic in 1 year for follow-up ?Return to clinic in?1 yr?for?follow-up. ?

## 2021-07-28 DIAGNOSIS — Z20822 Contact with and (suspected) exposure to covid-19: Secondary | ICD-10-CM | POA: Diagnosis not present

## 2021-08-04 NOTE — Progress Notes (Deleted)
[facility] [place]                                     Cardiology Office Note:    Date:  08/04/2021   ID:  Alexandria Randolph, DOB 03/17/1935, MRN [id number]  PCP:  Hoyt Koch, MD  [facility] Cardiologist:  Donato Heinz, MD / Dr. Burt Knack & Dr. Cyndia Bent (TAVR) [facility] Electrophysiologist:  None   Referring MD: Hoyt Koch, *   1 year s/p TAVR  History of Present Illness:    Alexandria Randolph is a 87 y.o. female with a hx of HTN, HLD, breast cancer s/p lumpectomy and radioactive seed Rx (2018) and remote, chronic combined S/D CHF with recent admission for acute CHF and severe AS s/p TAVR (08/31/20) who presents to clinic for follow up.   No prior cardiac history until 08/21/20.  Patient presented to [facility] ED for 2 weeks of increasing dyspnea. EKG showed sinus tachycardia, rate 112 bpm, with PVC and T wave inversions in in lateral leads. High-sensitivity troponin elevated and flat at 372 >> 389. BNP markedly elevated at 2,105.  CTA negative for PE.  Started on IV Lasix as well as antibiotics. Echo showed EF 20-25%, G2DD, severe AS with a mean gradient over 40 mm hg despite low EF.  Decision was made to proceed with [facility] for further workup and TAVR consideration.  LHC/RHC with minimal CAD with at most 30 to 40% ostial left main.  She had critical AS with a peak to peak gradient of 72 mmHg and a valve area of 0.39 cm indexed to 0.25 cm/m.  She underwent TAVR with a 23 mm Edwards S3U via the TF approach during her hospitalization with excellent result.  Repeat echo showed resolution of aortic stenosis and normal functioning replacement valve, EF remained 20-25%.  GDMT limited by low bp. Was tolerating SGLT2i during hospitalization but it was not rx'd at discharge. Jardiance was added back at advanced CHF follow up. GDMT limited by persistant hypotension and orthostasis. She did quite well in follow up. 1 month echo showed  EF 50-55%, normally functioning TAVR with a mean gradient of 8 mm hg and no PVL.  Today the patient presents to clinic for follow up.   Past Medical History:  Diagnosis Date   Allergy    SEASONAL   Cataracts, bilateral    DYSLIPIDEMIA 12/01/2006   Environmental allergies    has had allergy test:grass,trees,plants,dust   HYPERGLYCEMIA 12/01/2006   HYPERTENSION 12/01/2006   OSTEOPOROSIS 12/01/2006   S/P TAVR (transcatheter aortic valve replacement) 08/31/2020   s/p TAVR with 23 mm Edwards S3U via the TF approach by Dr. Cyndia Bent and Dr. Burt Knack.   Severe aortic stenosis    SMOKER 12/01/2006    Past Surgical History:  Procedure Laterality Date   ABDOMINAL HYSTERECTOMY     BREAST LUMPECTOMY WITH RADIOACTIVE SEED LOCALIZATION Right 01/19/2017   Procedure: RIGHT BREAST LUMPECTOMY WITH RADIOACTIVE SEED LOCALIZATION;  Surgeon: Fanny Skates, MD;  Location: [place];  Service: General;  Laterality: Right;   COLONOSCOPY     INTRAOPERATIVE TRANSTHORACIC ECHOCARDIOGRAM Left 08/31/2020   Procedure: INTRAOPERATIVE TRANSTHORACIC ECHOCARDIOGRAM;  Surgeon: Sherren Mocha, MD;  Location: [place];  Service: Open Heart Surgery;  Laterality: Left;   POLYPECTOMY     RIGHT/LEFT HEART CATH AND CORONARY ANGIOGRAPHY N/A 08/23/2020   Procedure: RIGHT/LEFT  HEART CATH AND CORONARY ANGIOGRAPHY;  Surgeon: Lorretta Harp, MD;  Location: [place] [facility];  Service: Cardiovascular;  Laterality: N/A;   TOTAL ABDOMINAL HYSTERECTOMY W/ BILATERAL SALPINGOOPHORECTOMY  1980   TRANSCATHETER AORTIC VALVE REPLACEMENT, TRANSFEMORAL N/A 08/31/2020   Procedure: TRANSCATHETER AORTIC VALVE REPLACEMENT, TRANSFEMORAL;  Surgeon: Sherren Mocha, MD;  Location: [place];  Service: Open Heart Surgery;  Laterality: N/A;   ULTRASOUND GUIDANCE FOR VASCULAR ACCESS Bilateral 08/31/2020   Procedure: ULTRASOUND GUIDANCE FOR VASCULAR ACCESS;  Surgeon: Sherren Mocha, MD;  Location: [place];  Service: Open Heart Surgery;   Laterality: Bilateral;    Current Medications: No outpatient medications have been marked as taking for the 08/05/21 encounter (Appointment) with [facility].     Allergies:   Losartan potassium, Olmesartan medoxomil, and Simvastatin   Social History   Socioeconomic History   Marital status: Single    Spouse name: Not on file   Number of children: 0   Years of education: Not on file   Highest education level: Not on file  Occupational History   Occupation: Retired  Tobacco Use   Smoking status: Former    Types: Cigarettes    Quit date: 03/20/2000    Years since quitting: 21.3   Smokeless tobacco: Never   Tobacco comments:    Pt states that she smoked off and on- "I don't know for how long"  Substance and Sexual Activity   Alcohol use: Not Currently    Alcohol/week: 0.0 standard drinks    Comment: occ   Drug use: No   Sexual activity: Not on file  Other Topics Concern   Not on file  Social History Narrative   Married   Social Determinants of Health   Financial Resource Strain: Low Risk    Difficulty of Paying Living Expenses: Not hard at all  Food Insecurity: No Food Insecurity   Worried About Charity fundraiser in the Last Year: Never true   Arboriculturist in the Last Year: Never true  Transportation Needs: No Transportation Needs   Lack of Transportation (Medical): No   Lack of Transportation (Non-Medical): No  Physical Activity: Sufficiently Active   Days of Exercise per Week: 5 days   Minutes of Exercise per Session: 30 min  Stress: No Stress Concern Present   Feeling of Stress : Not at all  Social Connections: Moderately Integrated   Frequency of Communication with Friends and Family: More than three times a week   Frequency of Social Gatherings with Friends and Family: Once a week   Attends Religious Services: 1 to 4 times per year   Active Member of [employer] or Organizations: No   Attends Music therapist: 1 to 4 times per  year   Marital Status: Divorced     Family History: The patient's family history includes Breast cancer in her paternal aunt; Cancer in her mother; Diabetes in her father. There is no history of Colon cancer.  ROS:   Please see the history of present illness.    All other systems reviewed and are negative.  EKGs/Labs/Other Studies Reviewed:    The following studies were reviewed today:  TAVR OPERATIVE NOTE     Date of Procedure:                08/31/2020   Preoperative Diagnosis:      Severe Aortic Stenosis   Postoperative Diagnosis:    Same   Procedure:  Transcatheter Aortic Valve Replacement - Percutaneous Transfemoral Approach             Edwards Sapien 3 Ultra THV (size 23 mm, model # 9750TFX, serial # [id number])              Co-Surgeons:                        Gaye Pollack, MD and Sherren Mocha, MD   Anesthesiologist:                  Duane Boston, MD   Echocardiographer:              [name], MD   Pre-operative Echo Findings: Critical aortic stenosis Severe global left ventricular systolic dysfunction   Post-operative Echo Findings: No paravalvular leak Unchanged left ventricular systolic function  _______________________  Echo 09/01/20 IMPRESSIONS  1. The aortic valve has been repaired/replaced. Well seated 23 mm Sapien  3 Valve with DVI 0.35, no paravalvular leak, and with appropriate  gradients and acceleration time. Aortic valve regurgitation is not  visualized. No aortic stenosis is present.  Aortic valve mean gradient measures 5.7 mmHg. Aortic valve Vmax measures  1.62 m/s. Aortic valve acceleration time measures 80 msec.   2. Left ventricular ejection fraction, by estimation, is 20 to 25%. Left  ventricular ejection fraction by 2D MOD biplane is 21.5 %. The left  ventricle has severely decreased function. The left ventricle demonstrates  global hypokinesis. Left ventricular  diastolic parameters are consistent with Grade II diastolic  dysfunction  (pseudonormalization).   3. Right ventricular systolic function is mildly reduced. The right  ventricular size is normal. Tricuspid regurgitation signal is inadequate  for assessing PA pressure.   4. Left atrial size was moderately dilated.   5. The mitral valve is abnormal. Trivial mitral valve regurgitation. No  evidence of mitral stenosis.   6. The inferior vena cava is normal in size with greater than 50%  respiratory variability, suggesting right atrial pressure of 3 mmHg.   Comparison(s): A prior study was performed on 08/31/20. Prior images  reviewed side by side. Stable TAVR positioning.   _____________________  Echo 10/07/20 IMPRESSIONS  1. S/P TAVR (Sapien 3, mean gradient 8 mmHg, peak velocity 2 m/s, DI 0.55; no AI); compared to 09/01/20, LV function has improved.  2. Left ventricular ejection fraction, by estimation, is 50 to 55%. The left ventricle has low normal function. The left ventricle has no regional wall motion abnormalities. Left ventricular diastolic parameters are consistent with Grade I diastolic dysfunction (impaired relaxation).  3. Right ventricular systolic function is normal. The right ventricular size is normal.  4. The mitral valve is normal in structure. No evidence of mitral valve regurgitation. No evidence of mitral stenosis.  5. The aortic valve has been repaired/replaced. Aortic valve regurgitation is not visualized. No aortic stenosis is present. There is a 23 mm Sapien prosthetic (TAVR) valve present in the aortic position. Procedure Date: 08/31/20.  6. The inferior vena cava is normal in size with greater than 50% respiratory variability, suggesting right atrial pressure of 3 mmHg.   Comparison(s): 09/01/20 EF 20-25%. AV 76mHg mean PG, 132mg peak PG.  _____________________  Echo 08/05/21 ***   EKG:  EKG is NOT ordered today.   Recent Labs: 08/21/2020: ALT 50; B Natriuretic Peptide 2,105.0; TSH 2.383 09/01/2020: Magnesium  2.2 09/03/2020: BUN 11; Creatinine, Ser 0.82; Hemoglobin 10.7; Platelets 174; Potassium 3.5; Sodium 135  Recent Lipid  Panel    Component Value Date/Time   CHOL 180 08/21/2020 1834   TRIG 90 08/21/2020 1834   TRIG 110 02/20/2006 0910   HDL 45 08/21/2020 1834   CHOLHDL 4.0 08/21/2020 1834   VLDL 18 08/21/2020 1834   LDLCALC 117 (H) 08/21/2020 1834   LDLDIRECT 184.7 11/27/2012 1026     Risk Assessment/Calculations:       Physical Exam:    VS:  There were no vitals taken for this visit.    Wt Readings from Last 3 Encounters:  10/20/20 127 lb 3.2 oz (57.7 kg)  10/07/20 127 lb (57.6 kg)  09/07/20 127 lb 6.4 oz (57.8 kg)     GEN:  Well nourished, well developed in no acute distress HEENT: Normal NECK: No JVD;  LYMPHATICS: No lymphadenopathy CARDIAC: RRR, no murmurs, rubs, gallops RESPIRATORY:  Clear to auscultation without rales, wheezing or rhonchi  ABDOMEN: Soft, non-tender, non-distended MUSCULOSKELETAL:  No edema; No deformity  SKIN: Warm and dry NEUROLOGIC:  Alert and oriented x 3 PSYCHIATRIC:  Normal affect   ASSESSMENT:    1. S/P TAVR (transcatheter aortic valve replacement)   2. Chronic combined systolic and diastolic congestive heart failure (HCC)   3. Pulmonary mass     PLAN:    In order of problems listed above:  Severe AS s/p TAVR: echo today shows   She is doing excellent with NYHA class I symptoms and feels "back to normal." Continue on aspirin. STOP plavix. SBE prophylaxis discussed; she has amoxicillin. Continue regular follow up with Dr Gardiner Rhyme  Chronic combined S/D CHF: EF improved to 50-55% today (up from 20-25%) after TAVR  Pulmonary mass: pre TAVR CT showed an irregular nodular and masslike focus of consolidation in the posterior right lower and posterior right upper lung lobes, stable in size and slightly more organized since 08/21/2020 chest CT, indeterminate for pneumonia versus neoplasm. Short-term follow-up chest CT recommended in 2-3  months, preferably after a trial of antibiotic therapy. She was treated with Cefdinir and doxy. Follow up CT 10/26/20 showed resolution of mass.    Medication Adjustments/Labs and Tests Ordered: Current medicines are reviewed at length with the patient today.  Concerns regarding medicines are outlined above.  No orders of the defined types were placed in this encounter.   No orders of the defined types were placed in this encounter.    There are no Patient Instructions on file for this visit.   Signed, Angelena Form, PA-C  08/04/2021 1:25 PM    [facility]

## 2021-08-05 ENCOUNTER — Ambulatory Visit (HOSPITAL_COMMUNITY): Payer: Medicare Other | Attending: Cardiology

## 2021-08-05 ENCOUNTER — Ambulatory Visit: Payer: Medicare Other

## 2021-08-05 DIAGNOSIS — R918 Other nonspecific abnormal finding of lung field: Secondary | ICD-10-CM

## 2021-08-05 DIAGNOSIS — I5042 Chronic combined systolic (congestive) and diastolic (congestive) heart failure: Secondary | ICD-10-CM

## 2021-08-05 DIAGNOSIS — Z952 Presence of prosthetic heart valve: Secondary | ICD-10-CM

## 2021-08-12 ENCOUNTER — Encounter: Payer: Self-pay | Admitting: Physician Assistant

## 2021-08-16 ENCOUNTER — Telehealth: Payer: Self-pay

## 2021-08-16 NOTE — Telephone Encounter (Signed)
**Note De-Identified [name]** Jardiance PA started through covermymeds.  Re: Alexandria Randolph, [birth year],  Message: The Prior Authorization request has been approved for Jardiance '10MG'$  OR TABS. The authorization is valid from 07/17/2021 through 08/16/2022. A letter of explanation will also be mailed to the patient.  I have notified [id number] [place](Ph: [phone] of this approval.

## 2021-09-06 NOTE — Progress Notes (Deleted)
[facility] [place]                                     Cardiology Office Note:    Date:  09/06/2021   ID:  Alexandria Randolph, DOB [birth year], MRN [id number]  PCP:  Hoyt Koch, MD  [facility] Cardiologist:  Donato Heinz, MD  [facility] Electrophysiologist:  None   Referring MD: Hoyt Koch, *   1 year s/p TAVR  History of Present Illness:    Alexandria Randolph is a 87 y.o. female with a hx of HTN, HLD, breast cancer s/p lumpectomy and radioactive seed Rx (2018) and remote, chronic combined S/D CHF with recent admission for acute CHF and severe AS s/p TAVR (08/31/20) who presents to clinic for follow up.    No prior cardiac history until 08/21/20.  Patient presented to [facility] ED for 2 weeks of increasing dyspnea. EKG showed sinus tachycardia, rate 112 bpm, with PVC and T wave inversions in in lateral leads. High-sensitivity troponin elevated and flat at 372 >> 389. BNP markedly elevated at 2,105.  CTA negative for PE.  Started on IV Lasix as well as antibiotics. ECHO ordered cardiology consulted.  ECHO with EF 20-25%, G2DD, severe AS with a mean gradient over 40 mm hg despite low EF.  Decision was made to proceed with [facility] for further workup and TAVR consideration.  LHC/RHC with minimal CAD with at most 30 to 40% ostial left main.  She has critical AS with a peak to peak gradient of 72 mmHg and a valve area of 0.39 cm indexed to 0.25 cm/m.  She underwent TAVR with a 23 mm Edwards S3U via the TF approach during her hospitalization with excellent result.  Repeat ECHO with resolution of aortic stenosis and normal functioning replacement valve, EF remained 20-25%.  GDMT limited by low bp. Was tolerating SGLT2i during hospitalization but it was not rx'd at discharge. Jardiance was added back at advanced CHF follow up. GDMT limited by persistant hypotension and orthostasis. 1 month echo showed EF 50-55%, normally functioning  TAVR with a mean gradient of 8 mm hg and no PVL.   Today the patient presents to clinic for follow up.      Past Medical History:  Diagnosis Date   Allergy    SEASONAL   Cataracts, bilateral    DYSLIPIDEMIA 12/01/2006   Environmental allergies    has had allergy test:grass,trees,plants,dust   HYPERGLYCEMIA 12/01/2006   HYPERTENSION 12/01/2006   OSTEOPOROSIS 12/01/2006   S/P TAVR (transcatheter aortic valve replacement) 08/31/2020   s/p TAVR with 23 mm Edwards S3U via the TF approach by Dr. Cyndia Bent and Dr. Burt Knack.   Severe aortic stenosis    SMOKER 12/01/2006    Past Surgical History:  Procedure Laterality Date   ABDOMINAL HYSTERECTOMY     BREAST LUMPECTOMY WITH RADIOACTIVE SEED LOCALIZATION Right 01/19/2017   Procedure: RIGHT BREAST LUMPECTOMY WITH RADIOACTIVE SEED LOCALIZATION;  Surgeon: Fanny Skates, MD;  Location: [place];  Service: General;  Laterality: Right;   COLONOSCOPY     INTRAOPERATIVE TRANSTHORACIC ECHOCARDIOGRAM Left 08/31/2020   Procedure: INTRAOPERATIVE TRANSTHORACIC ECHOCARDIOGRAM;  Surgeon: Sherren Mocha, MD;  Location: [place];  Service: Open Heart Surgery;  Laterality: Left;   POLYPECTOMY     RIGHT/LEFT HEART CATH AND CORONARY ANGIOGRAPHY N/A 08/23/2020   Procedure: RIGHT/LEFT HEART CATH AND  CORONARY ANGIOGRAPHY;  Surgeon: Lorretta Harp, MD;  Location: [place] [facility];  Service: Cardiovascular;  Laterality: N/A;   TOTAL ABDOMINAL HYSTERECTOMY W/ BILATERAL SALPINGOOPHORECTOMY  1980   TRANSCATHETER AORTIC VALVE REPLACEMENT, TRANSFEMORAL N/A 08/31/2020   Procedure: TRANSCATHETER AORTIC VALVE REPLACEMENT, TRANSFEMORAL;  Surgeon: Sherren Mocha, MD;  Location: [place];  Service: Open Heart Surgery;  Laterality: N/A;   ULTRASOUND GUIDANCE FOR VASCULAR ACCESS Bilateral 08/31/2020   Procedure: ULTRASOUND GUIDANCE FOR VASCULAR ACCESS;  Surgeon: Sherren Mocha, MD;  Location: [place];  Service: Open Heart Surgery;  Laterality: Bilateral;    Current  Medications: No outpatient medications have been marked as taking for the 09/08/21 encounter (Appointment) with Eileen Stanford, PA-C.     Allergies:   Losartan potassium, Olmesartan medoxomil, and Simvastatin   Social History   Socioeconomic History   Marital status: Single    Spouse name: Not on file   Number of children: 0   Years of education: Not on file   Highest education level: Not on file  Occupational History   Occupation: Retired  Tobacco Use   Smoking status: Former    Types: Cigarettes    Quit date: 03/20/2000    Years since quitting: 21.4   Smokeless tobacco: Never   Tobacco comments:    Pt states that she smoked off and on- "I don't know for how long"  Substance and Sexual Activity   Alcohol use: Not Currently    Alcohol/week: 0.0 standard drinks of alcohol    Comment: occ   Drug use: No   Sexual activity: Not on file  Other Topics Concern   Not on file  Social History Narrative   Married   Social Determinants of Health   Financial Resource Strain: Low Risk  (10/20/2020)   Overall Financial Resource Strain (CARDIA)    Difficulty of Paying Living Expenses: Not hard at all  Food Insecurity: No Food Insecurity (08/24/2020)   Hunger Vital Sign    Worried About Running Out of Food in the Last Year: Never true    [place] in the Last Year: Never true  Transportation Needs: No Transportation Needs (10/20/2020)   PRAPARE - Hydrologist (Medical): No    Lack of Transportation (Non-Medical): No  Physical Activity: Sufficiently Active (10/20/2020)   Exercise Vital Sign    Days of Exercise per Week: 5 days    Minutes of Exercise per Session: 30 min  Stress: No Stress Concern Present (10/20/2020)   [place]    Feeling of Stress : Not at all  Social Connections: Moderately Integrated (10/20/2020)   Social Connection and Isolation Panel [NHANES]    Frequency of  Communication with Friends and Family: More than three times a week    Frequency of Social Gatherings with Friends and Family: Once a week    Attends Religious Services: 1 to 4 times per year    Active Member of [employer] or Organizations: No    Attends Music therapist: 1 to 4 times per year    Marital Status: Divorced     Family History: The patient's family history includes Breast cancer in her paternal aunt; Cancer in her mother; Diabetes in her father. There is no history of Colon cancer.  ROS:   Please see the history of present illness.    All other systems reviewed and are negative.  EKGs/Labs/Other Studies Reviewed:    The following  studies were reviewed today:   TAVR OPERATIVE NOTE     Date of Procedure:                08/31/2020   Preoperative Diagnosis:      Severe Aortic Stenosis   Postoperative Diagnosis:    Same   Procedure:        Transcatheter Aortic Valve Replacement - Percutaneous Transfemoral Approach             Edwards Sapien 3 Ultra THV (size 23 mm, model # 9750TFX, serial # [id number])              Co-Surgeons:                        Gaye Pollack, MD and Sherren Mocha, MD   Anesthesiologist:                  Duane Boston, MD   Echocardiographer:              [name], MD   Pre-operative Echo Findings: Critical aortic stenosis Severe global left ventricular systolic dysfunction   Post-operative Echo Findings: No paravalvular leak Unchanged left ventricular systolic function   _______________________   Echo 09/01/20 IMPRESSIONS  1. The aortic valve has been repaired/replaced. Well seated 23 mm Sapien  3 Valve with DVI 0.35, no paravalvular leak, and with appropriate  gradients and acceleration time. Aortic valve regurgitation is not  visualized. No aortic stenosis is present.  Aortic valve mean gradient measures 5.7 mmHg. Aortic valve Vmax measures  1.62 m/s. Aortic valve acceleration time measures 80 msec.   2. Left  ventricular ejection fraction, by estimation, is 20 to 25%. Left  ventricular ejection fraction by 2D MOD biplane is 21.5 %. The left  ventricle has severely decreased function. The left ventricle demonstrates  global hypokinesis. Left ventricular  diastolic parameters are consistent with Grade II diastolic dysfunction  (pseudonormalization).   3. Right ventricular systolic function is mildly reduced. The right  ventricular size is normal. Tricuspid regurgitation signal is inadequate  for assessing PA pressure.   4. Left atrial size was moderately dilated.   5. The mitral valve is abnormal. Trivial mitral valve regurgitation. No  evidence of mitral stenosis.   6. The inferior vena cava is normal in size with greater than 50%  respiratory variability, suggesting right atrial pressure of 3 mmHg.   Comparison(s): A prior study was performed on 08/31/20. Prior images  reviewed side by side. Stable TAVR positioning.    _____________________   Echo 10/07/20 IMPRESSIONS  1. S/P TAVR (Sapien 3, mean gradient 8 mmHg, peak velocity 2 m/s, DI 0.55; no AI); compared to 09/01/20, LV function has improved.  2. Left ventricular ejection fraction, by estimation, is 50 to 55%. The left ventricle has low normal function. The left ventricle has no regional wall motion abnormalities. Left ventricular diastolic parameters are consistent with Grade I diastolic dysfunction (impaired relaxation).  3. Right ventricular systolic function is normal. The right ventricular size is normal.  4. The mitral valve is normal in structure. No evidence of mitral valve regurgitation. No evidence of mitral stenosis.  5. The aortic valve has been repaired/replaced. Aortic valve regurgitation is not visualized. No aortic stenosis is present. There is a 23 mm Sapien prosthetic (TAVR) valve present in the aortic position. Procedure Date: 08/31/20.  6. The inferior vena cava is normal in size with greater than 50% respiratory  variability, suggesting right  atrial pressure of 3 mmHg.   Comparison(s): 09/01/20 EF 20-25%. AV 28mHg mean PG, 18mg peak PG.    _____________________     Echo 09/07/21 ***  EKG:  EKG is ordered today.  The ekg ordered today demonstrates ***  Recent Labs: No results found for requested labs within last 365 days.  Recent Lipid Panel    Component Value Date/Time   CHOL 180 08/21/2020 1834   TRIG 90 08/21/2020 1834   TRIG 110 02/20/2006 0910   HDL 45 08/21/2020 1834   CHOLHDL 4.0 08/21/2020 1834   VLDL 18 08/21/2020 1834   LDLCALC 117 (H) 08/21/2020 1834   LDLDIRECT 184.7 11/27/2012 1026     Risk Assessment/Calculations:       Physical Exam:    VS:  There were no vitals taken for this visit.    Wt Readings from Last 3 Encounters:  10/20/20 127 lb 3.2 oz (57.7 kg)  10/07/20 127 lb (57.6 kg)  09/07/20 127 lb 6.4 oz (57.8 kg)     GEN: *** Well nourished, well developed in no acute distress HEENT: Normal NECK: No JVD LYMPHATICS: No lymphadenopathy CARDIAC: ***RRR, no murmurs, rubs, gallops RESPIRATORY:  Clear to auscultation without rales, wheezing or rhonchi  ABDOMEN: Soft, non-tender, non-distended MUSCULOSKELETAL:  No edema; No deformity  SKIN: Warm and dry NEUROLOGIC:  Alert and oriented x 3 PSYCHIATRIC:  Normal affect   ASSESSMENT:    1. S/P TAVR (transcatheter aortic valve replacement)   2. Chronic combined systolic and diastolic congestive heart failure (HCC)   3. Pulmonary mass    PLAN:    In order of problems listed above:  Severe AS s/p TAVR:    She is doing excellent with NYHA class I symptoms. Continue on aspirin alone. SBE prophylaxis discussed; I have RX'd amoxicillin.    Chronic combined S/D CHF: EF improved to 50-55%    Pulmonary mass: pre TAVR CT showed an irregular nodular and masslike focus of consolidation in the posterior right lower and posterior right upper lung lobes, stable in size and slightly more organized since 08/21/2020  chest CT, indeterminate for pneumonia versus neoplasm. Short-term follow-up chest CT recommended in 2-3 months, preferably after a trial of antibiotic therapy. She was treated with Cefdinir and doxy. Follow up CT 10/26/20 reassuring with no worrisome mass.           Medication Adjustments/Labs and Tests Ordered: Current medicines are reviewed at length with the patient today.  Concerns regarding medicines are outlined above.  No orders of the defined types were placed in this encounter.  No orders of the defined types were placed in this encounter.   There are no Patient Instructions on file for this visit.   Signed, KaAngelena FormPA-C  09/06/2021 12:44 PM    [facility]

## 2021-09-08 ENCOUNTER — Telehealth: Payer: Self-pay | Admitting: Physician Assistant

## 2021-09-08 ENCOUNTER — Ambulatory Visit: Payer: Medicare Other | Admitting: Physician Assistant

## 2021-09-08 ENCOUNTER — Ambulatory Visit (HOSPITAL_COMMUNITY): Payer: Medicare Other | Attending: Cardiology

## 2021-09-08 ENCOUNTER — Encounter: Payer: Self-pay | Admitting: Cardiology

## 2021-09-08 DIAGNOSIS — R918 Other nonspecific abnormal finding of lung field: Secondary | ICD-10-CM

## 2021-09-08 DIAGNOSIS — I5042 Chronic combined systolic (congestive) and diastolic (congestive) heart failure: Secondary | ICD-10-CM

## 2021-09-08 DIAGNOSIS — Z952 Presence of prosthetic heart valve: Secondary | ICD-10-CM

## 2021-09-08 NOTE — Progress Notes (Unsigned)
Patient ID: Alexandria Randolph, female   DOB: January 03, 1935, 87 y.o.   MRN: [id number]  Verified appointment "no show" status with Rodena Piety at 9:39am.

## 2021-09-08 NOTE — Telephone Encounter (Signed)
  [place] VALVE TEAM  Patient currently due for her 1 year visit status post TAVR.  She has been scheduled multiple times and repeatedly no-shows.  I personally spoke to her on the phone and made the appointment for today.  She did not show up.  I was able to complete a KCCQ over the phone, copied below.  She has New York Heart Association class I symptoms and is doing quite well.  She understands that we advise her to be seen in the office for routine cardiology follow-up, but I am not confident that she will follow through.  [place] Cardiomyopathy Questionnaire     09/08/2021   10:38 AM 10/07/2020    2:24 PM 08/23/2020    3:25 PM  KCCQ-12  1 a. Ability to shower/bathe Not at all limited Not at all limited Quite a bit limited  1 b. Ability to walk 1 block Not at all limited Other, Did not do Extremely limited  1 c. Ability to hurry/jog Not at all limited Other, Did not do Other, Did not do  2. Edema feet/ankles/legs Never over the past 2 weeks Never over the past 2 weeks Never over the past 2 weeks  3. Limited by fatigue Never over the past 2 weeks Never over the past 2 weeks Less than once a week  4. Limited by dyspnea Never over the past 2 weeks Never over the past 2 weeks All of the time  5. Sitting up / on 3+ pillows Never over the past 2 weeks Never over the past 2 weeks Every night  6. Limited enjoyment of life Not limited at all Not limited at all Extremely limited  7. Rest of life w/ symptoms Completely satisfied Completely satisfied Not at all satisfied  8 a. Participation in hobbies Did not limit at all N/A, did not do for other reasons Severely limited  8 b. Participation in chores Did not limit at all Did not limit at all Severely limited  8 c. Visiting family/friends Did not limit at all N/A, did not do for other reasons Severely limited     Angelena Form PA-C  [facility]

## 2021-09-13 ENCOUNTER — Encounter (HOSPITAL_COMMUNITY): Payer: Self-pay | Admitting: Physician Assistant

## 2021-09-17 ENCOUNTER — Other Ambulatory Visit: Payer: Self-pay | Admitting: Hematology and Oncology

## 2021-10-04 ENCOUNTER — Other Ambulatory Visit: Payer: Self-pay | Admitting: Hematology and Oncology

## 2021-10-24 ENCOUNTER — Emergency Department (HOSPITAL_COMMUNITY): Payer: Medicare Other

## 2021-10-24 ENCOUNTER — Other Ambulatory Visit: Payer: Self-pay | Admitting: Physician Assistant

## 2021-10-24 ENCOUNTER — Other Ambulatory Visit: Payer: Self-pay

## 2021-10-24 ENCOUNTER — Encounter (HOSPITAL_COMMUNITY): Payer: Self-pay

## 2021-10-24 ENCOUNTER — Emergency Department (HOSPITAL_COMMUNITY)
Admission: EM | Admit: 2021-10-24 | Discharge: 2021-10-24 | Disposition: A | Discharge status: Home / Self Care | Payer: Medicare Other | Attending: Emergency Medicine | Admitting: Emergency Medicine

## 2021-10-24 DIAGNOSIS — K8689 Other specified diseases of pancreas: Secondary | ICD-10-CM | POA: Diagnosis not present

## 2021-10-24 DIAGNOSIS — K760 Fatty (change of) liver, not elsewhere classified: Secondary | ICD-10-CM | POA: Diagnosis not present

## 2021-10-24 DIAGNOSIS — Z7982 Long term (current) use of aspirin: Secondary | ICD-10-CM | POA: Diagnosis not present

## 2021-10-24 DIAGNOSIS — K8020 Calculus of gallbladder without cholecystitis without obstruction: Secondary | ICD-10-CM | POA: Diagnosis not present

## 2021-10-24 DIAGNOSIS — M25552 Pain in left hip: Secondary | ICD-10-CM | POA: Diagnosis not present

## 2021-10-24 DIAGNOSIS — Z7401 Bed confinement status: Secondary | ICD-10-CM | POA: Diagnosis not present

## 2021-10-24 DIAGNOSIS — R1084 Generalized abdominal pain: Secondary | ICD-10-CM | POA: Diagnosis not present

## 2021-10-24 DIAGNOSIS — R1030 Lower abdominal pain, unspecified: Secondary | ICD-10-CM | POA: Diagnosis not present

## 2021-10-24 DIAGNOSIS — L97421 Non-pressure chronic ulcer of left heel and midfoot limited to breakdown of skin: Secondary | ICD-10-CM | POA: Diagnosis not present

## 2021-10-24 DIAGNOSIS — K281 Acute gastrojejunal ulcer with perforation: Secondary | ICD-10-CM | POA: Diagnosis not present

## 2021-10-24 DIAGNOSIS — R109 Unspecified abdominal pain: Secondary | ICD-10-CM | POA: Diagnosis present

## 2021-10-24 LAB — LIPASE, BLOOD: Lipase: 28 U/L (ref 11–51)

## 2021-10-24 LAB — COMPREHENSIVE METABOLIC PANEL
ALT: 11 U/L (ref 0–44)
AST: 20 U/L (ref 15–41)
Albumin: 2.4 g/dL — ABNORMAL LOW (ref 3.5–5.0)
Alkaline Phosphatase: 74 U/L (ref 38–126)
Anion gap: 7 (ref 5–15)
BUN: 16 mg/dL (ref 8–23)
CO2: 22 mmol/L (ref 22–32)
Calcium: 8.3 mg/dL — ABNORMAL LOW (ref 8.9–10.3)
Chloride: 110 mmol/L (ref 98–111)
Creatinine, Ser: 0.77 mg/dL (ref 0.44–1.00)
GFR, Estimated: >60 mL/min (ref >60)
Glucose, Bld: 114 mg/dL — ABNORMAL HIGH (ref 70–99)
Potassium: 3.9 mmol/L (ref 3.5–5.1)
Sodium: 139 mmol/L (ref 135–145)
Total Bilirubin: 0.6 mg/dL (ref 0.3–1.2)
Total Protein: 6 g/dL — ABNORMAL LOW (ref 6.5–8.1)

## 2021-10-24 LAB — CBC WITH DIFFERENTIAL/PLATELET
Abs Immature Granulocytes: 0.06 10*3/uL (ref 0.00–0.07)
Basophils Absolute: 0.1 10*3/uL (ref 0.0–0.1)
Basophils Relative: 0 %
Eosinophils Absolute: 0 10*3/uL (ref 0.0–0.5)
Eosinophils Relative: 0 %
HCT: 37.3 % (ref 36.0–46.0)
Hemoglobin: 11.7 g/dL — ABNORMAL LOW (ref 12.0–15.0)
Immature Granulocytes: 0 %
Lymphocytes Relative: 5 %
Lymphs Abs: 0.7 10*3/uL (ref 0.7–4.0)
MCH: 26.4 pg (ref 26.0–34.0)
MCHC: 31.4 g/dL (ref 30.0–36.0)
MCV: 84 fL (ref 80.0–100.0)
Monocytes Absolute: 0.7 10*3/uL (ref 0.1–1.0)
Monocytes Relative: 5 %
Neutro Abs: 12.9 10*3/uL — ABNORMAL HIGH (ref 1.7–7.7)
Neutrophils Relative %: 90 %
Platelets: 197 10*3/uL (ref 150–400)
RBC: 4.44 MIL/uL (ref 3.87–5.11)
RDW: 17 % — ABNORMAL HIGH (ref 11.5–15.5)
WBC: 14.3 10*3/uL — ABNORMAL HIGH (ref 4.0–10.5)
nRBC: 0 % (ref 0.0–0.2)

## 2021-10-24 MED ORDER — ONDANSETRON HCL 4 MG/2ML IJ SOLN
4.0000 mg | Freq: Once | INTRAMUSCULAR | Status: DC
  Filled 2021-10-24: qty 2

## 2021-10-24 MED ORDER — SODIUM CHLORIDE 0.9 % IV BOLUS
1000.0000 mL | Freq: Once | INTRAVENOUS | Status: AC
Start: 2021-10-24 — End: 2021-10-24
  Administered 2021-10-24: 1000 mL via INTRAVENOUS

## 2021-10-24 MED ORDER — MORPHINE SULFATE (PF) 2 MG/ML IV SOLN
2.0000 mg | Freq: Once | INTRAVENOUS | Status: DC
  Filled 2021-10-24: qty 1

## 2021-10-24 MED ORDER — IOHEXOL 300 MG/ML  SOLN
100.0000 mL | Freq: Once | INTRAMUSCULAR | Status: AC | PRN
  Administered 2021-10-24: 100 mL via INTRAVENOUS

## 2021-10-24 NOTE — ED Notes (Addendum)
Pt received to [phone] via EMS stretcher. Pt c/o sliding out of the bed 3 days ago. Pt denies hitting head or LOC. Pt denies abd pain at this time. Pt H&P obtained. Call bell in reach. Pt on continuous monitoring and placed in gown.

## 2021-10-24 NOTE — ED Notes (Signed)
Pt resting on stretcher. Pt refused morphine and zofran at this time. Pt reports "I am okay."

## 2021-10-24 NOTE — ED Provider Notes (Signed)
[facility] EMERGENCY DEPARTMENT Provider Note   CSN: [id number] Arrival date & time: 10/24/21  0935     History  Chief Complaint  Patient presents with   Abdominal Pain    Alexandria Randolph is a 87 y.o. female.  87 yo F with a chief complaint of left hip and leg pain.  She tells me this been going on since she suffered a fall which happened a couple days ago.  Per the family that history is not consistent.  Tell me that she has had difficulty walking and suffered a fall maybe 3 months ago.  Has been mostly bedbound since then.  She is complaining of some abdominal discomfort and so they brought her in for evaluation.  No fevers no vomiting.  No chest pain no difficulty breathing.   Abdominal Pain      Home Medications Prior to Admission medications   Medication Sig Start Date End Date Taking? Authorizing Provider  amoxicillin (AMOXIL) 500 MG tablet TAKE 4 TABLETS BY MOUTH 1 HOUR PRIOR TO ANY DENTAL PROCEDURES 10/07/20   Eileen Stanford, PA-C  aspirin 81 MG chewable tablet CHEW 1 TABLET BY MOUTH EVERY DAY 12/07/20   Eileen Stanford, PA-C  clopidogrel (PLAVIX) 75 MG tablet TAKE 1 TABLET BY MOUTH DAILY WITH BREAKFAST. 05/02/21   Eileen Stanford, PA-C  fexofenadine (ALLEGRA) 180 MG tablet Take 180 mg by mouth daily.    [provider]  JARDIANCE 10 MG TABS tablet TAKE 1 TABLET BY MOUTH EVERY DAY BEFORE BREAKFAST 12/01/20   Eileen Stanford, PA-C  letrozole [facility]) 2.5 MG tablet TAKE 1 TABLET EVERY DAY 10/05/21   Nicholas Lose, MD  Polyethyl Glycol-Propyl Glycol (SYSTANE FREE OP) Place 1 drop into both eyes daily as needed (For dry eyes). Patient not taking: Reported on 10/20/2020    [provider]      Allergies    Losartan potassium, Olmesartan medoxomil, and Simvastatin    Review of Systems   Review of Systems  Gastrointestinal:  Positive for abdominal pain.    Physical Exam Updated Vital Signs BP 120/64   Pulse 90   Temp  97.7 F (36.5 C) (Oral)   Resp 18   Ht 5' (1.524 m)   Wt 57.6 kg   SpO2 100%   BMI 24.80 kg/m  Physical Exam Vitals and nursing note reviewed.  Constitutional:      General: She is not in acute distress.    Appearance: She is well-developed. She is not diaphoretic.  HENT:     Head: Normocephalic and atraumatic.  Eyes:     Pupils: Pupils are equal, round, and reactive to light.  Cardiovascular:     Rate and Rhythm: Normal rate and regular rhythm.     Heart sounds: No murmur heard.    No friction rub. No gallop.  Pulmonary:     Effort: Pulmonary effort is normal.     Breath sounds: No wheezing or rales.  Abdominal:     General: There is no distension.     Palpations: Abdomen is soft.     Tenderness: There is no abdominal tenderness.  Musculoskeletal:        General: No tenderness.     Cervical back: Normal range of motion and neck supple.  Skin:    General: Skin is warm and dry.     Comments: Appears very dehydrated on exam  Neurological:     Mental Status: She is alert and oriented to person,  place, and time.  Psychiatric:        Behavior: Behavior normal.     ED Results / Procedures / Treatments   Labs (all labs ordered are listed, but only abnormal results are displayed) Labs Reviewed  CBC WITH DIFFERENTIAL/PLATELET - Abnormal; Notable for the following components:      Result Value   WBC 14.3 (*)    Hemoglobin 11.7 (*)    RDW 17.0 (*)    Neutro Abs 12.9 (*)    All other components within normal limits  COMPREHENSIVE METABOLIC PANEL - Abnormal; Notable for the following components:   Glucose, Bld 114 (*)    Calcium 8.3 (*)    Total Protein 6.0 (*)    Albumin 2.4 (*)    All other components within normal limits  LIPASE, BLOOD  URINALYSIS, ROUTINE W REFLEX MICROSCOPIC    EKG None  Radiology DG Hip Unilat W or Wo Pelvis 2-3 Views Left  Result Date: 10/24/2021 CLINICAL DATA:  Left hip pain post fall EXAM: DG HIP (WITH OR WITHOUT PELVIS) 2-3V LEFT  COMPARISON:  None Available. FINDINGS: There is no evidence of acute fracture or dislocation. There is mild left and severe right hip osteoarthritis. Bilateral SI joint degenerative change. Soft tissues appear unremarkable radiographically. IMPRESSION: No evidence of acute left hip fracture. Note that if the patient is unable to bear weight and clinical suspicion for non-displaced hip fracture is significant, CT or MRI would be more sensitive Mild left and severe right hip osteoarthritis. Electronically Signed   By: Maurine Simmering M.D.   On: 10/24/2021 11:21    Procedures Procedures    Medications Ordered in ED Medications  morphine (PF) 2 MG/ML injection 2 mg (has no administration in time range)  ondansetron (ZOFRAN) injection 4 mg (has no administration in time range)  sodium chloride 0.9 % bolus 1,000 mL (1,000 mLs Intravenous New Bag/Given 10/24/21 1123)  iohexol (OMNIPAQUE) 300 MG/ML solution 100 mL (100 mLs Intravenous Contrast Given 10/24/21 1451)    ED Course/ Medical Decision Making/ A&P                           Medical Decision Making Amount and/or Complexity of Data Reviewed Labs: ordered. Radiology: ordered.  Risk Prescription drug management.   87 yo F with a cc of abdominal pain.  Going on since yesterday, no fevers. Declining last three months.  She has some mild lower abdominal discomfort on my exam.  Appears clinically dehydrated.  Bolus of IV fluids pain and nausea medicine plain film of the left hip independently interpreted by me without fracture.  Awaiting CT scan.  Signed out to Dr. Reather Converse, please see his note for further details of care in the ED.  The patients results and plan were reviewed and discussed.   Any x-rays performed were independently reviewed by myself.   Differential diagnosis were considered with the presenting HPI.  Medications  morphine (PF) 2 MG/ML injection 2 mg (has no administration in time range)  ondansetron (ZOFRAN) injection 4 mg (has  no administration in time range)  sodium chloride 0.9 % bolus 1,000 mL (1,000 mLs Intravenous New Bag/Given 10/24/21 1123)  iohexol (OMNIPAQUE) 300 MG/ML solution 100 mL (100 mLs Intravenous Contrast Given 10/24/21 1451)    Vitals:   10/24/21 1045 10/24/21 1230 10/24/21 1347 10/24/21 1500  BP: (!) 150/80 139/66 (!) 155/74 120/64  Pulse: 100 93 92 90  Resp: (!) '24 18 16 '$ 18  Temp:   97.7 F (36.5 C)   TempSrc:   Oral   SpO2: 99% 100% 100% 100%  Weight:      Height:        Final diagnoses:  Generalized abdominal pain  Left hip pain    Admission/ observation were discussed with the admitting physician, patient and/or family and they are comfortable with the plan.          Final Clinical Impression(s) / ED Diagnoses Final diagnoses:  Generalized abdominal pain  Left hip pain    Rx / DC Orders ED Discharge Orders     None         Deno Etienne, DO 10/24/21 1523

## 2021-10-24 NOTE — ED Notes (Signed)
Pt reports pain at a 3/10. Pt refused medication at this time.

## 2021-10-24 NOTE — ED Notes (Signed)
Pt noted to have open sore to left heel.

## 2021-10-24 NOTE — ED Notes (Signed)
Patient discharged home via [place]. Patient alert and oriented and stable before leaving with [facility]. IV was taken out prior to patient being discharged.

## 2021-10-24 NOTE — ED Notes (Signed)
Patient transported to X-ray 

## 2021-10-24 NOTE — ED Notes (Signed)
Report given and care endorsed to [facility]

## 2021-10-24 NOTE — Consult Note (Signed)
Hospital Consult    Reason for Consult: Occluded bilateral external neck arteries Referring Physician: Dr. [name] MRN #:  [id number]  History of Present Illness: This is a 87 y.o. female with a history of aortic valve disease status post TAVR 1 year ago presented with left hip and leg pain as well as abdominal pain that is now resolved.  She states at this time she does not have any leg pain and has not been having pain other than in her left heel where she had a fall per her family member which was about 3 months ago.  She does live at home and her family member lives with her.  She has not had any fevers or chills.  Denies any previous vascular history.  She is a former smoker quit over 20 years ago.  She is on aspirin and Plavix currently.  Past Medical History:  Diagnosis Date   Allergy    SEASONAL   Cataracts, bilateral    DYSLIPIDEMIA 12/01/2006   Environmental allergies    has had allergy test:grass,trees,plants,dust   HYPERGLYCEMIA 12/01/2006   HYPERTENSION 12/01/2006   OSTEOPOROSIS 12/01/2006   S/P TAVR (transcatheter aortic valve replacement) 08/31/2020   s/p TAVR with 23 mm Edwards S3U via the TF approach by Dr. Cyndia Bent and Dr. Burt Knack.   Severe aortic stenosis    SMOKER 12/01/2006    Past Surgical History:  Procedure Laterality Date   ABDOMINAL HYSTERECTOMY     BREAST LUMPECTOMY WITH RADIOACTIVE SEED LOCALIZATION Right 01/19/2017   Procedure: RIGHT BREAST LUMPECTOMY WITH RADIOACTIVE SEED LOCALIZATION;  Surgeon: Fanny Skates, MD;  Location: [facility];  Service: General;  Laterality: Right;   COLONOSCOPY     INTRAOPERATIVE TRANSTHORACIC ECHOCARDIOGRAM Left 08/31/2020   Procedure: INTRAOPERATIVE TRANSTHORACIC ECHOCARDIOGRAM;  Surgeon: Sherren Mocha, MD;  Location: [facility];  Service: Open Heart Surgery;  Laterality: Left;   POLYPECTOMY     RIGHT/LEFT HEART CATH AND CORONARY ANGIOGRAPHY N/A 08/23/2020   Procedure: RIGHT/LEFT HEART CATH AND CORONARY  ANGIOGRAPHY;  Surgeon: Lorretta Harp, MD;  Location: [facility];  Service: Cardiovascular;  Laterality: N/A;   TOTAL ABDOMINAL HYSTERECTOMY W/ BILATERAL SALPINGOOPHORECTOMY  1980   TRANSCATHETER AORTIC VALVE REPLACEMENT, TRANSFEMORAL N/A 08/31/2020   Procedure: TRANSCATHETER AORTIC VALVE REPLACEMENT, TRANSFEMORAL;  Surgeon: Sherren Mocha, MD;  Location: [facility];  Service: Open Heart Surgery;  Laterality: N/A;   ULTRASOUND GUIDANCE FOR VASCULAR ACCESS Bilateral 08/31/2020   Procedure: ULTRASOUND GUIDANCE FOR VASCULAR ACCESS;  Surgeon: Sherren Mocha, MD;  Location: [facility];  Service: Open Heart Surgery;  Laterality: Bilateral;    Allergies  Allergen Reactions   Losartan Potassium     REACTION: sob   Olmesartan Medoxomil     REACTION: sob   Simvastatin     REACTION: [place]gias    Prior to Admission medications   Medication Sig Start Date End Date Taking? Authorizing Provider  amoxicillin (AMOXIL) 500 MG tablet TAKE 4 TABLETS BY MOUTH 1 HOUR PRIOR TO ANY DENTAL PROCEDURES 10/07/20   Eileen Stanford, PA-C  aspirin 81 MG chewable tablet CHEW 1 TABLET BY MOUTH EVERY DAY 12/07/20   Eileen Stanford, PA-C  clopidogrel (PLAVIX) 75 MG tablet TAKE 1 TABLET BY MOUTH EVERY DAY WITH BREAKFAST 10/24/21   Eileen Stanford, PA-C  fexofenadine (ALLEGRA) 180 MG tablet Take 180 mg by mouth daily.    [provider]  JARDIANCE 10 MG TABS tablet TAKE 1 TABLET BY MOUTH EVERY DAY BEFORE BREAKFAST 12/01/20   Angelena Form  R, PA-C  letrozole (FEMARA) 2.5 MG tablet TAKE 1 TABLET EVERY DAY 10/05/21   Nicholas Lose, MD  Polyethyl Glycol-Propyl Glycol (SYSTANE FREE OP) Place 1 drop into both eyes daily as needed (For dry eyes). Patient not taking: Reported on 10/20/2020    [provider]    Social History   Socioeconomic History   Marital status: Single    Spouse name: Not on file   Number of children: 0   Years of education: Not on file   Highest education level: Not on file   Occupational History   Occupation: Retired  Tobacco Use   Smoking status: Former    Types: Cigarettes    Quit date: 03/20/2000    Years since quitting: 21.6   Smokeless tobacco: Never   Tobacco comments:    Pt states that she smoked off and on- "I don't know for how long"  Substance and Sexual Activity   Alcohol use: Not Currently    Alcohol/week: 0.0 standard drinks of alcohol    Comment: occ   Drug use: No   Sexual activity: Not on file  Other Topics Concern   Not on file  Social History Narrative   Married   Social Determinants of Health   Financial Resource Strain: Low Risk  (10/20/2020)   Overall Financial Resource Strain (CARDIA)    Difficulty of Paying Living Expenses: Not hard at all  Food Insecurity: No Food Insecurity (08/24/2020)   Hunger Vital Sign    Worried About Running Out of Food in the Last Year: Never true    [place] in the Last Year: Never true  Transportation Needs: No Transportation Needs (10/20/2020)   PRAPARE - Hydrologist (Medical): No    Lack of Transportation (Non-Medical): No  Physical Activity: Sufficiently Active (10/20/2020)   Exercise Vital Sign    Days of Exercise per Week: 5 days    Minutes of Exercise per Session: 30 min  Stress: No Stress Concern Present (10/20/2020)   [place]    Feeling of Stress : Not at all  Social Connections: Moderately Integrated (10/20/2020)   Social Connection and Isolation Panel [NHANES]    Frequency of Communication with Friends and Family: More than three times a week    Frequency of Social Gatherings with Friends and Family: Once a week    Attends Religious Services: 1 to 4 times per year    Active Member of [employer] or Organizations: No    Attends Music therapist: 1 to 4 times per year    Marital Status: Divorced  Human resources officer Violence: Not At Risk (10/20/2020)   Humiliation, Afraid, Rape, and  Kick questionnaire    Fear of Current or Ex-Partner: No    Emotionally Abused: No    Physically Abused: No    Sexually Abused: No     Family History  Problem Relation Age of Onset   Cancer Mother        "Throat" and colon cancer, both at an advanced age   Diabetes Father    Breast cancer Paternal Aunt    Colon cancer Neg Hx     Review of Systems  Constitutional: Negative.   HENT: Negative.    Eyes: Negative.   Respiratory: Negative.    Cardiovascular: Negative.   Gastrointestinal: Negative.   Musculoskeletal:  Positive for falls.  Skin:        Left heel wound  Neurological: Negative.   Endo/Heme/Allergies: Negative.   Psychiatric/Behavioral: Negative.        Physical Examination  Vitals:   10/24/21 1347 10/24/21 1500  BP: (!) 155/74 120/64  Pulse: 92 90  Resp: 16 18  Temp: 97.7 F (36.5 C)   SpO2: 100% 100%   Body mass index is 24.8 kg/m.  Physical Exam HENT:     Head: Normocephalic.  Cardiovascular:     Rate and Rhythm: Normal rate.     Pulses:          Femoral pulses are 0 on the right side and 0 on the left side.      Dorsalis pedis pulses are 0 on the right side and 0 on the left side.       Posterior tibial pulses are 0 on the right side and 0 on the left side.  Musculoskeletal:     Right lower leg: No edema.     Left lower leg: No edema.  Skin:    Capillary Refill: Capillary refill takes 2 to 3 seconds.     Comments: As pictured below of left heel  Neurological:     General: No focal deficit present.     Mental Status: She is alert and oriented to person, place, and time.  Psychiatric:        Mood and Affect: Mood normal.      CBC    Component Value Date/Time   WBC 14.3 (H) 10/24/2021 1125   RBC 4.44 10/24/2021 1125   HGB 11.7 (L) 10/24/2021 1125   HGB 14.3 01/03/2017 0849   HCT 37.3 10/24/2021 1125   HCT 44.5 01/03/2017 0849   PLT 197 10/24/2021 1125   PLT 287 01/03/2017 0849   MCV 84.0 10/24/2021 1125   MCV 92.7 01/03/2017  0849   MCH 26.4 10/24/2021 1125   MCHC 31.4 10/24/2021 1125   RDW 17.0 (H) 10/24/2021 1125   RDW 14.2 01/03/2017 0849   LYMPHSABS 0.7 10/24/2021 1125   LYMPHSABS 0.8 (L) 01/03/2017 0849   MONOABS 0.7 10/24/2021 1125   MONOABS 0.5 01/03/2017 0849   EOSABS 0.0 10/24/2021 1125   EOSABS 0.1 01/03/2017 0849   BASOSABS 0.1 10/24/2021 1125   BASOSABS 0.0 01/03/2017 0849    BMET    Component Value Date/Time   NA 139 10/24/2021 1245   NA 143 01/03/2017 0849   K 3.9 10/24/2021 1245   K 3.5 01/03/2017 0849   CL 110 10/24/2021 1245   CO2 22 10/24/2021 1245   CO2 21 (L) 01/03/2017 0849   GLUCOSE 114 (H) 10/24/2021 1245   GLUCOSE 124 01/03/2017 0849   GLUCOSE 102 (H) 02/20/2006 0910   BUN 16 10/24/2021 1245   BUN 8.4 01/03/2017 0849   CREATININE 0.77 10/24/2021 1245   CREATININE 0.9 01/03/2017 0849   CALCIUM 8.3 (L) 10/24/2021 1245   CALCIUM 9.6 01/03/2017 0849   GFRNONAA >60 10/24/2021 1245   GFRAA >60 11/01/2014 1440    COAGS: Lab Results  Component Value Date   INR 1.1 08/30/2020     Non-Invasive Vascular Imaging:   CT abdomen pelvis IMPRESSION: 1. There appears to be lack of opacification of the right external iliac artery and the majority of the left external iliac artery, with reconstitution of enhancing collateral vessels in the region of the bilateral femoral arteries. No dominant femoral artery opacification is visualized. Consider bilateral external iliac artery and lower extremity arterial vascular ultrasound for further evaluation. Alternatively, a CTA of the pelvis with bilateral  lower extremity runoff could be performed however the patient just received 100 mL of iodinated contrast for the current study and a delay prior to giving more iodinated contrast may be considered depending on the patient's renal function. A report only from 08/25/2020 (images unavailable) does not describe significant stenosis or occlusion of either external iliac artery at that  time. 2. Fatty liver. 3. Cholelithiasis. 4. Low-density superior greater than inferior individual lesions within the spleen. Differential considerations include cysts, cystic sequela of remote splenic infarcts, lymphangiomas, or hemangiomas.      ASSESSMENT/PLAN: This is a 87 y.o. female with history of TAVR currently on aspirin and Plavix.  At the time of TAVR she underwent CT scan which demonstrated patent external iliac and common femoral arteries but now appear occluded which certainly appears chronic from both physical exam and chronicity of wound on the left heel as well as reconstitution of the common femoral arteries on the CT scan.  Her only revascularization option appears to be either an ax bifemoral bypass or aortobifemoral bypass but at this time patient is uninterested in any further surgeries.  I discussed with her and her family member at bedside that she has a very high risk for proximal leg amputation which she is also not interested in this.  She will need close interval follow-up of her left heel wound and would benefit from wound care center referral as an outpatient.  I will continue to follow her as an inpatient for further discussion regarding future surgical intervention.  Brianni Manthe C. Donzetta Matters, MD [facility] Office: [phone] Pager: [phone]

## 2021-10-24 NOTE — ED Triage Notes (Signed)
Lower abd pain that started two days ago.  N/v yesterday but not today.  Reports pain radiating down into left leg as well.  Patient slid out of bed on Friday but wasn't seen for it.  BP 158/90 HR 112 RR 20 99%

## 2021-10-24 NOTE — ED Notes (Signed)
Provider at bedside. Pt agreeable to go home. Pt verbalizes need for EMS transport home.

## 2021-10-24 NOTE — ED Provider Notes (Signed)
Patient care signed out to follow-up CT scan results and final disposition.  CT scan results reviewed showing vascular abnormalities in the iliacs and other areas.  Discussed with vascular surgeon who assessed the patient in the ED and due to age/medical problems patient is not a great surgical candidate and does not want any surgeries.  Patient's pain is controlled at this time.  She has been deconditioned in the past few months and difficulty getting around and also has a pressure ulcer on her left heel.  I recommended assessment for rehab or nursing home placement however patient is adamant she does not want to do that at this time.  Patient's family members in the room for discussion.  Patient will except home health assessment and more help at home.  Patient stable for discharge at this time.  Follow-up with wound care, vascular surgery and primary doctor.   Alexandria Morrison, MD 10/24/21 [phone]

## 2021-10-24 NOTE — Discharge Instructions (Addendum)
Follow-up in the home with home health assessment and you will need arrangement to go to the wound center for an appointment.

## 2021-11-17 ENCOUNTER — Other Ambulatory Visit: Payer: Self-pay | Admitting: Physician Assistant

## 2021-12-01 ENCOUNTER — Inpatient Hospital Stay: Payer: Medicare Other | Attending: Hematology and Oncology | Admitting: Hematology and Oncology

## 2021-12-01 NOTE — Assessment & Plan Note (Deleted)
01/19/2017 right lumpectomy: IDC grade 2, 1 cm, with high-grade DCIS, margins negative, ER 100%, PR 100%, HER-2 negative, Ki-67 15%, T1BNX stage I a  Recommendation: 1.Radiation oncology determined that she does not need adjuvant radiationprimarily because of her elderly age. 2. Adjuvant antiestrogen therapy with anastrozole 1 mg dailyX5 yearsstartedDecember 2018  Anastrozole toxicities: Patient is experiencing decreased appetite and attributes this to anastrozole therapy.   Breast cancer surveillance: 1.Breast exam:12/01/2021: Benign 2.mammogram:at [facility]2/15/22: Benign breast density category B  3.Bone density test10/10/2017: T score -1.45mld osteopenia Return to clinic in1 yrforfollow-up.

## 2021-12-05 ENCOUNTER — Emergency Department (HOSPITAL_COMMUNITY)
Admission: EM | Admit: 2021-12-05 | Discharge: 2021-12-18 | Disposition: E | Payer: Medicare Other | Attending: Emergency Medicine | Admitting: Emergency Medicine

## 2021-12-05 DIAGNOSIS — Z7982 Long term (current) use of aspirin: Secondary | ICD-10-CM | POA: Insufficient documentation

## 2021-12-05 DIAGNOSIS — M791 Myalgia, unspecified site: Secondary | ICD-10-CM | POA: Insufficient documentation

## 2021-12-05 DIAGNOSIS — R531 Weakness: Secondary | ICD-10-CM | POA: Insufficient documentation

## 2021-12-05 DIAGNOSIS — R5381 Other malaise: Secondary | ICD-10-CM | POA: Insufficient documentation

## 2021-12-05 DIAGNOSIS — R5383 Other fatigue: Secondary | ICD-10-CM | POA: Insufficient documentation

## 2021-12-05 DIAGNOSIS — I110 Hypertensive heart disease with heart failure: Secondary | ICD-10-CM | POA: Diagnosis not present

## 2021-12-05 DIAGNOSIS — A419 Sepsis, unspecified organism: Secondary | ICD-10-CM | POA: Diagnosis not present

## 2021-12-05 DIAGNOSIS — I509 Heart failure, unspecified: Secondary | ICD-10-CM | POA: Insufficient documentation

## 2021-12-05 DIAGNOSIS — I959 Hypotension, unspecified: Secondary | ICD-10-CM | POA: Diagnosis not present

## 2021-12-05 DIAGNOSIS — Z853 Personal history of malignant neoplasm of breast: Secondary | ICD-10-CM | POA: Diagnosis not present

## 2021-12-05 DIAGNOSIS — J45909 Unspecified asthma, uncomplicated: Secondary | ICD-10-CM | POA: Insufficient documentation

## 2021-12-05 DIAGNOSIS — R0602 Shortness of breath: Secondary | ICD-10-CM | POA: Insufficient documentation

## 2021-12-05 DIAGNOSIS — R0902 Hypoxemia: Secondary | ICD-10-CM | POA: Diagnosis not present

## 2021-12-06 ENCOUNTER — Emergency Department (HOSPITAL_COMMUNITY): Payer: Medicare Other

## 2021-12-06 DIAGNOSIS — R531 Weakness: Secondary | ICD-10-CM | POA: Diagnosis not present

## 2021-12-06 DIAGNOSIS — A419 Sepsis, unspecified organism: Secondary | ICD-10-CM | POA: Diagnosis not present

## 2021-12-06 LAB — URINALYSIS, ROUTINE W REFLEX MICROSCOPIC
Bacteria, UA: NONE SEEN
Bilirubin Urine: NEGATIVE
Glucose, UA: >=500 mg/dL — AB
Ketones, ur: NEGATIVE mg/dL
Leukocytes,Ua: NEGATIVE
Nitrite: NEGATIVE
Protein, ur: 30 mg/dL — AB
Specific Gravity, Urine: 1.018 (ref 1.005–1.030)
pH: 5 (ref 5.0–8.0)

## 2021-12-06 LAB — I-STAT VENOUS BLOOD GAS, ED
Acid-base deficit: 24 mmol/L — ABNORMAL HIGH (ref 0.0–2.0)
Bicarbonate: 4.7 mmol/L — ABNORMAL LOW (ref 20.0–28.0)
Calcium, Ion: 0.96 mmol/L — ABNORMAL LOW (ref 1.15–1.40)
HCT: 19 % — ABNORMAL LOW (ref 36.0–46.0)
Hemoglobin: 6.5 g/dL — CL (ref 12.0–15.0)
O2 Saturation: 99 %
Potassium: 7.8 mmol/L (ref 3.5–5.1)
Sodium: 135 mmol/L (ref 135–145)
TCO2: 5 mmol/L — ABNORMAL LOW (ref 22–32)
pCO2, Ven: 16.9 mmHg — CL (ref 44–60)
pH, Ven: 7.049 — CL (ref 7.25–7.43)
pO2, Ven: 193 mmHg — ABNORMAL HIGH (ref 32–45)

## 2021-12-06 LAB — COMPREHENSIVE METABOLIC PANEL
ALT: 70 U/L — ABNORMAL HIGH (ref 0–44)
AST: 123 U/L — ABNORMAL HIGH (ref 15–41)
Albumin: 1.8 g/dL — ABNORMAL LOW (ref 3.5–5.0)
Alkaline Phosphatase: 121 U/L (ref 38–126)
BUN: 54 mg/dL — ABNORMAL HIGH (ref 8–23)
CO2: <7 mmol/L — ABNORMAL LOW (ref 22–32)
Calcium: 8.9 mg/dL (ref 8.9–10.3)
Chloride: 108 mmol/L (ref 98–111)
Creatinine, Ser: 3.59 mg/dL — ABNORMAL HIGH (ref 0.44–1.00)
GFR, Estimated: 12 mL/min — ABNORMAL LOW (ref >60)
Glucose, Bld: 72 mg/dL (ref 70–99)
Potassium: >7.5 mmol/L (ref 3.5–5.1)
Sodium: 140 mmol/L (ref 135–145)
Total Bilirubin: 0.9 mg/dL (ref 0.3–1.2)
Total Protein: 6.3 g/dL — ABNORMAL LOW (ref 6.5–8.1)

## 2021-12-06 LAB — TYPE AND SCREEN
ABO/RH(D): O POS
Antibody Screen: NEGATIVE

## 2021-12-06 LAB — CBC WITH DIFFERENTIAL/PLATELET
Abs Immature Granulocytes: 1.09 10*3/uL — ABNORMAL HIGH (ref 0.00–0.07)
Basophils Absolute: 0.1 10*3/uL (ref 0.0–0.1)
Basophils Relative: 0 %
Eosinophils Absolute: 0 10*3/uL (ref 0.0–0.5)
Eosinophils Relative: 0 %
HCT: 24.3 % — ABNORMAL LOW (ref 36.0–46.0)
Hemoglobin: 6.5 g/dL — CL (ref 12.0–15.0)
Immature Granulocytes: 3 %
Lymphocytes Relative: 10 %
Lymphs Abs: 3.4 10*3/uL (ref 0.7–4.0)
MCH: 27.2 pg (ref 26.0–34.0)
MCHC: 26.7 g/dL — ABNORMAL LOW (ref 30.0–36.0)
MCV: 101.7 fL — ABNORMAL HIGH (ref 80.0–100.0)
Monocytes Absolute: 2 10*3/uL — ABNORMAL HIGH (ref 0.1–1.0)
Monocytes Relative: 6 %
Neutro Abs: 28.3 10*3/uL — ABNORMAL HIGH (ref 1.7–7.7)
Neutrophils Relative %: 81 %
Platelets: 401 10*3/uL — ABNORMAL HIGH (ref 150–400)
RBC: 2.39 MIL/uL — ABNORMAL LOW (ref 3.87–5.11)
RDW: 19.6 % — ABNORMAL HIGH (ref 11.5–15.5)
Smear Review: NORMAL
WBC: 34.8 10*3/uL — ABNORMAL HIGH (ref 4.0–10.5)
nRBC: 0.9 % — ABNORMAL HIGH (ref 0.0–0.2)

## 2021-12-06 LAB — APTT: aPTT: 29 seconds (ref 24–36)

## 2021-12-06 LAB — I-STAT CHEM 8, ED
BUN: 53 mg/dL — ABNORMAL HIGH (ref 8–23)
Calcium, Ion: 0.96 mmol/L — ABNORMAL LOW (ref 1.15–1.40)
Chloride: 114 mmol/L — ABNORMAL HIGH (ref 98–111)
Creatinine, Ser: 3.3 mg/dL — ABNORMAL HIGH (ref 0.44–1.00)
Glucose, Bld: 66 mg/dL — ABNORMAL LOW (ref 70–99)
HCT: 20 % — ABNORMAL LOW (ref 36.0–46.0)
Hemoglobin: 6.8 g/dL — CL (ref 12.0–15.0)
Potassium: 7.7 mmol/L (ref 3.5–5.1)
Sodium: 135 mmol/L (ref 135–145)
TCO2: 7 mmol/L — ABNORMAL LOW (ref 22–32)

## 2021-12-06 LAB — BRAIN NATRIURETIC PEPTIDE: B Natriuretic Peptide: 426.6 pg/mL — ABNORMAL HIGH (ref 0.0–100.0)

## 2021-12-06 LAB — PROTIME-INR
INR: 2 — ABNORMAL HIGH (ref 0.8–1.2)
Prothrombin Time: 22.6 seconds — ABNORMAL HIGH (ref 11.4–15.2)

## 2021-12-06 LAB — POC OCCULT BLOOD, ED: Fecal Occult Bld: POSITIVE — AB

## 2021-12-06 LAB — LACTIC ACID, PLASMA: Lactic Acid, Venous: >9 mmol/L (ref 0.5–1.9)

## 2021-12-06 LAB — LIPASE, BLOOD: Lipase: 41 U/L (ref 11–51)

## 2021-12-06 MED ORDER — SODIUM CHLORIDE 0.9 % IV BOLUS
500.0000 mL | Freq: Once | INTRAVENOUS | Status: AC
  Administered 2021-12-06: 500 mL via INTRAVENOUS

## 2021-12-06 MED ORDER — SODIUM CHLORIDE 0.9 % IV SOLN
INTRAVENOUS | Status: DC
Start: 2021-12-06 — End: 2021-12-06

## 2021-12-18 ENCOUNTER — Other Ambulatory Visit: Payer: Self-pay | Admitting: Physician Assistant

## 2021-12-18 NOTE — ED Provider Notes (Signed)
[facility] EMERGENCY DEPARTMENT Provider Note  CSN: [id number] Arrival date & time: 11/18/2021 2356  Chief Complaint(s) Weakness  HPI FEMALE IAFRATE is a 87 y.o. female with a past medical history listed below including aortic stenosis resulting in heart failure with previous EF of 20 to 25% status post TAVR resulting in improved EF of 50 to 55% noted on an echo performed 1 year ago.  She presents today for several days of gradually worsening generalized fatigue.  Patient is endorsing general malaise and myalgia.  Denied any nausea or vomiting.  No diarrhea but reported dark stools.  She is endorsing some cough.   She is accompanied by her nonfamily caregiver, [name], who confirms the patient's history.  He also noted that patient had been having increased generalized weakness and difficulty ambulating over the past week and more so over the past couple days.  He is noted the patient has been short of breath with ambulating.     Weakness   Past Medical History Past Medical History:  Diagnosis Date  . Allergy    SEASONAL  . Cataracts, bilateral   . DYSLIPIDEMIA 12/01/2006  . Environmental allergies    has had allergy test:grass,trees,plants,dust  . HYPERGLYCEMIA 12/01/2006  . HYPERTENSION 12/01/2006  . OSTEOPOROSIS 12/01/2006  . S/P TAVR (transcatheter aortic valve replacement) 08/31/2020   s/p TAVR with 23 mm Edwards S3U via the TF approach by Dr. Cyndia Bent and Dr. Burt Knack.  . Severe aortic stenosis   . SMOKER 12/01/2006   Patient Active Problem List   Diagnosis Date Noted  . S/P TAVR (transcatheter aortic valve replacement) 08/31/2020  . Severe aortic stenosis 08/23/2020  . Acute CHF (congestive heart failure) ([place]) 08/21/2020  . Elevated d-dimer 08/21/2020  . Malignant neoplasm of upper-outer quadrant of right breast in female, estrogen receptor positive ([place]) 01/02/2017  . Cough variant asthma 12/24/2014  . Asthma with exacerbation 12/07/2014  .  Arthritis of right hip 02/18/2014  . Osteoarthritis of right knee 11/27/2012  . Wellness examination 09/14/2010  . Chronic rhinitis 09/14/2010  . HIP PAIN, RIGHT 06/22/2009  . Dyslipidemia 12/01/2006  . SMOKER 12/01/2006  . HTN (hypertension) 12/01/2006  . Osteoporosis 12/01/2006  . Hyperglycemia 12/01/2006   Home Medication(s) Prior to Admission medications   Medication Sig Start Date End Date Taking? Authorizing Provider  amoxicillin (AMOXIL) 500 MG tablet TAKE 4 TABLETS BY MOUTH 1 HOUR PRIOR TO ANY DENTAL PROCEDURES 10/07/20   Eileen Stanford, PA-C  aspirin 81 MG chewable tablet CHEW 1 TABLET BY MOUTH EVERY DAY 12/07/20   Eileen Stanford, PA-C  clopidogrel (PLAVIX) 75 MG tablet TAKE 1 TABLET BY MOUTH EVERY DAY WITH BREAKFAST 10/24/21   Eileen Stanford, PA-C  fexofenadine (ALLEGRA) 180 MG tablet Take 180 mg by mouth daily.    [provider]  JARDIANCE 10 MG TABS tablet TAKE 1 TABLET BY MOUTH EVERY DAY BEFORE BREAKFAST 12/01/20   Eileen Stanford, PA-C  letrozole [facility]) 2.5 MG tablet TAKE 1 TABLET EVERY DAY 10/05/21   Nicholas Lose, MD  Polyethyl Glycol-Propyl Glycol (SYSTANE FREE OP) Place 1 drop into both eyes daily as needed (For dry eyes). Patient not taking: Reported on 10/20/2020    [provider]  Allergies Losartan potassium, Olmesartan medoxomil, and Simvastatin  Review of Systems Review of Systems  Neurological:  Positive for weakness.   As noted in HPI  Physical Exam Vital Signs  I have reviewed the triage vital signs BP 124/73   Pulse (!)115  Temp (!) 96.8 F (36 C) (Temporal)   Resp (!) 29   SpO2 95%   Physical Exam Vitals reviewed.  Constitutional:      General: She is in acute distress.     Appearance: She is well-developed. She is ill-appearing and toxic-appearing. She is not diaphoretic.  HENT:      Head: Normocephalic and atraumatic.     Nose: Nose normal.     Mouth/Throat:     Mouth: Mucous membranes are dry.  Eyes:     General: No scleral icterus.    Pupils: Pupils are equal, round, and reactive to light.     Comments: Pale conjunctiva  Cardiovascular:     Rate and Rhythm: Regular rhythm. Tachycardia present.     Heart sounds: No murmur heard.    No friction rub. No gallop.  Pulmonary:     Effort: Pulmonary effort is normal. Tachypnea present. No respiratory distress.     Breath sounds: Normal breath sounds. No stridor. No rales.  Abdominal:     General: There is no distension.     Palpations: Abdomen is soft.     Tenderness: There is no abdominal tenderness.  Musculoskeletal:        General: No tenderness.     Cervical back: Normal range of motion and neck supple.  Skin:    General: Skin is warm and dry.     Comments: Pressur ulcers to sacrum and heels. See images  Neurological:     Mental Status: She is oriented to person, place, and time. She is lethargic.     Comments: Follow commands     ED Results and Treatments Labs (all labs ordered are listed, but only abnormal results are displayed) Labs Reviewed  PROTIME-INR - Abnormal; Notable for the following components:      Result Value   Prothrombin Time 22.6 (*)    INR 2.0 (*)    All other components within normal limits  URINALYSIS, ROUTINE W REFLEX MICROSCOPIC - Abnormal; Notable for the following components:   Color, Urine AMBER (*)    APPearance HAZY (*)    Glucose, UA >=500 (*)    Hgb urine dipstick MODERATE (*)    Protein, ur 30 (*)    All other components within normal limits  I-STAT CHEM 8, ED - Abnormal; Notable for the following components:   Potassium 7.7 (*)    Chloride 114 (*)    BUN 53 (*)    Creatinine, Ser 3.30 (*)    Glucose, Bld 66 (*)    Calcium, Ion 0.96 (*)    TCO2 7 (*)    Hemoglobin 6.8 (*)    HCT 20.0 (*)    All other components within normal limits  I-STAT VENOUS BLOOD  GAS, ED - Abnormal; Notable for the following components:   pH, Ven 7.049 (*)    pCO2, Ven 16.9 (*)    pO2, Ven 193 (*)    Bicarbonate 4.7 (*)    TCO2 5 (*)    Acid-base deficit 24.0 (*)    Potassium 7.8 (*)    Calcium, Ion 0.96 (*)    HCT 19.0 (*)    Hemoglobin 6.5 (*)    All other components within normal  limits  POC OCCULT BLOOD, ED - Abnormal; Notable for the following components:   Fecal Occult Bld POSITIVE (*)    All other components within normal limits  CULTURE, BLOOD (ROUTINE X 2)  CULTURE, BLOOD (ROUTINE X 2)  APTT  LACTIC ACID, PLASMA  LACTIC ACID, PLASMA  COMPREHENSIVE METABOLIC PANEL  CBC WITH DIFFERENTIAL/PLATELET  LIPASE, BLOOD  BRAIN NATRIURETIC PEPTIDE  TYPE AND SCREEN                                                                                                                         EKG  EKG Interpretation  Date/Time:  2021/12/16 01:21:17 EDT Ventricular Rate:  96 PR Interval:    QRS Duration: 192 QT Interval:  390 QTC Calculation: 493 R Axis:   -41 Text Interpretation: Accelerated junctional rhythm Right bundle branch block Abnormal T, consider ischemia, lateral leads Artifact in lead(s) I II III aVR aVL V1 Confirmed by Addison Lank [phone]) on December 16, 2021 1:42:48 AM       Radiology DG Chest Port 1 View  Result Date: 2021-12-16 CLINICAL DATA:  Sepsis EXAM: PORTABLE CHEST 1 VIEW COMPARISON:  08/30/2020 FINDINGS: Lungs are clear. No pneumothorax or pleural effusion. Cardiac size within normal limits. Transcatheter aortic valve replacement has been performed in the interval. Pulmonary vascularity is normal. No acute bone abnormality. IMPRESSION: No active disease. Interval transcatheter aortic valve replacement. Electronically Signed   By: Fidela Salisbury M.D.   On: Dec 16, 2021 00:58    Medications Ordered in ED Medications  0.9 %  sodium chloride infusion (has no administration in time range)  sodium chloride 0.9 % bolus 500 mL (500 mLs  Intravenous New Bag/Given 12-16-2021 0054)                                                                                                                                     Procedures .Critical Care  Performed by: Fatima Blank, MD Authorized by: Fatima Blank, MD   Critical care provider statement:    Critical care time (minutes):  45   Critical care time was exclusive of:  Separately billable procedures and treating other patients   Critical care was necessary to treat or prevent imminent or life-threatening deterioration of the following conditions:  Cardiac failure, circulatory failure and dehydration   Critical care was time spent personally by me on the following activities:  Development  of treatment plan with patient or surrogate, discussions with consultants, evaluation of patient's response to treatment, examination of patient, obtaining history from patient or surrogate, review of old charts, re-evaluation of patient's condition, pulse oximetry, ordering and review of radiographic studies, ordering and review of laboratory studies and ordering and performing treatments and interventions   (including critical care time)  Medical Decision Making / ED Course   Medical Decision Making Amount and/or Complexity of Data Reviewed External Data Reviewed: radiology.    Details: ECHOs mentioned in HPI Labs: ordered. Decision-making details documented in ED Course. Radiology: ordered and independent interpretation performed. Decision-making details documented in ED Course. ECG/medicine tests: ordered and independent interpretation performed. Decision-making details documented in ED Course.  Risk Decision regarding hospitalization. Decision not to resuscitate or to de-escalate care because of poor prognosis. Risk Details: Patient confirmed that she was a DNR. Also noted in chart as of 08/2020    Patient is lethargic and toxic appearing.  Evidence of likely  anemia. Hemoccult was positive.   Differential also includes infectious process, metabolic process, dehydration, electrolyte derangements.  Patient was difficulty stick and delayed obtaining labs.  Chest x-ray without evidence of pneumonia, pneumothorax, pulmonary edema or pleural effusions.  Clinical Course as of 2022-01-05 0146  Tue Jan 05, 2022 Patient became bradycardic, hypotensive, and apneic.  Per patient's wishes ACLS measures were not pursued.  Patient was pronounced dead at 1:26 AM. [PC]  0145 Attempted to call caregiver, but was not able to reach him. No family contact in system. [PC]    Clinical Course User Index [PC] Brek Reece, Grayce Sessions, MD      Final Clinical Impression(s) / ED Diagnoses Final diagnoses:  Generalized weakness           This chart was dictated using voice recognition software.  Despite best efforts to proofread,  errors can occur which can change the documentation meaning.    Fatima Blank, MD 01/05/2022 [phone]

## 2021-12-18 DEATH — deceased

## 2022-01-01 ENCOUNTER — Other Ambulatory Visit: Payer: Self-pay | Admitting: Hematology and Oncology
